# Patient Record
Sex: Female | Born: 1960 | Race: White | Hispanic: No | Marital: Married | Smoking: Never smoker
Health system: Southern US, Community
[De-identification: ages and names within clinical notes are randomized; demographics above are authoritative.]

## PROBLEM LIST (undated history)

## (undated) DIAGNOSIS — E785 Hyperlipidemia, unspecified: Secondary | ICD-10-CM

## (undated) DIAGNOSIS — F32A Depression, unspecified: Secondary | ICD-10-CM

## (undated) DIAGNOSIS — M720 Palmar fascial fibromatosis [Dupuytren]: Secondary | ICD-10-CM

## (undated) DIAGNOSIS — M7500 Adhesive capsulitis of unspecified shoulder: Secondary | ICD-10-CM

## (undated) DIAGNOSIS — R87619 Unspecified abnormal cytological findings in specimens from cervix uteri: Secondary | ICD-10-CM

## (undated) DIAGNOSIS — E039 Hypothyroidism, unspecified: Secondary | ICD-10-CM

## (undated) DIAGNOSIS — E109 Type 1 diabetes mellitus without complications: Secondary | ICD-10-CM

## (undated) DIAGNOSIS — C4499 Other specified malignant neoplasm of skin, unspecified: Secondary | ICD-10-CM

## (undated) DIAGNOSIS — B029 Zoster without complications: Secondary | ICD-10-CM

## (undated) DIAGNOSIS — F329 Major depressive disorder, single episode, unspecified: Secondary | ICD-10-CM

## (undated) HISTORY — DX: Palmar fascial fibromatosis (dupuytren): M72.0

## (undated) HISTORY — PX: MOLE REMOVAL: SHX2046

## (undated) HISTORY — PX: COLONOSCOPY: SHX174

## (undated) HISTORY — DX: Zoster without complications: B02.9

## (undated) HISTORY — DX: Hypothyroidism, unspecified: E03.9

## (undated) HISTORY — DX: Other specified malignant neoplasm of skin, unspecified: C44.99

## (undated) HISTORY — DX: Depression, unspecified: F32.A

## (undated) HISTORY — DX: Major depressive disorder, single episode, unspecified: F32.9

## (undated) HISTORY — DX: Unspecified abnormal cytological findings in specimens from cervix uteri: R87.619

## (undated) HISTORY — DX: Type 1 diabetes mellitus without complications: E10.9

## (undated) HISTORY — DX: Adhesive capsulitis of unspecified shoulder: M75.00

## (undated) HISTORY — DX: Hyperlipidemia, unspecified: E78.5

---
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
## 1988-10-11 HISTORY — PX: KNEE ARTHROSCOPY: SHX127

## 1988-12-11 HISTORY — PX: OTHER SURGICAL HISTORY: SHX169

## 1994-07-13 HISTORY — PX: OTHER SURGICAL HISTORY: SHX169

## 1997-08-13 ENCOUNTER — Ambulatory Visit (HOSPITAL_COMMUNITY): Admission: RE | Admit: 1997-08-13 | Discharge: 1997-08-13 | Payer: Self-pay | Admitting: Obstetrics and Gynecology

## 1997-09-03 ENCOUNTER — Ambulatory Visit (HOSPITAL_COMMUNITY): Admission: RE | Admit: 1997-09-03 | Discharge: 1997-09-03 | Payer: Self-pay | Admitting: Obstetrics and Gynecology

## 1997-12-28 ENCOUNTER — Inpatient Hospital Stay (HOSPITAL_COMMUNITY): Admission: AD | Admit: 1997-12-28 | Discharge: 1997-12-31 | Payer: Self-pay | Admitting: Obstetrics and Gynecology

## 1998-07-08 ENCOUNTER — Other Ambulatory Visit: Admission: RE | Admit: 1998-07-08 | Discharge: 1998-07-08 | Payer: Self-pay | Admitting: *Deleted

## 1999-07-21 ENCOUNTER — Other Ambulatory Visit: Admission: RE | Admit: 1999-07-21 | Discharge: 1999-07-21 | Payer: Self-pay | Admitting: *Deleted

## 2000-05-11 ENCOUNTER — Ambulatory Visit (HOSPITAL_COMMUNITY): Admission: RE | Admit: 2000-05-11 | Discharge: 2000-05-11 | Payer: Self-pay | Admitting: *Deleted

## 2000-05-11 ENCOUNTER — Encounter: Payer: Self-pay | Admitting: *Deleted

## 2000-07-14 ENCOUNTER — Other Ambulatory Visit: Admission: RE | Admit: 2000-07-14 | Discharge: 2000-07-14 | Payer: Self-pay | Admitting: *Deleted

## 2001-06-29 ENCOUNTER — Encounter: Payer: Self-pay | Admitting: *Deleted

## 2001-06-29 ENCOUNTER — Ambulatory Visit (HOSPITAL_COMMUNITY): Admission: RE | Admit: 2001-06-29 | Discharge: 2001-06-29 | Payer: Self-pay | Admitting: *Deleted

## 2001-08-18 ENCOUNTER — Other Ambulatory Visit: Admission: RE | Admit: 2001-08-18 | Discharge: 2001-08-18 | Payer: Self-pay | Admitting: *Deleted

## 2002-06-30 ENCOUNTER — Ambulatory Visit (HOSPITAL_COMMUNITY): Admission: RE | Admit: 2002-06-30 | Discharge: 2002-06-30 | Payer: Self-pay | Admitting: *Deleted

## 2002-06-30 ENCOUNTER — Encounter: Payer: Self-pay | Admitting: *Deleted

## 2002-08-23 ENCOUNTER — Other Ambulatory Visit: Admission: RE | Admit: 2002-08-23 | Discharge: 2002-08-23 | Payer: Self-pay | Admitting: *Deleted

## 2003-07-09 ENCOUNTER — Ambulatory Visit (HOSPITAL_COMMUNITY): Admission: RE | Admit: 2003-07-09 | Discharge: 2003-07-09 | Payer: Self-pay | Admitting: *Deleted

## 2003-07-14 DIAGNOSIS — B029 Zoster without complications: Secondary | ICD-10-CM

## 2003-07-14 HISTORY — DX: Zoster without complications: B02.9

## 2003-08-29 ENCOUNTER — Other Ambulatory Visit: Admission: RE | Admit: 2003-08-29 | Discharge: 2003-08-29 | Payer: Self-pay | Admitting: *Deleted

## 2004-08-06 ENCOUNTER — Ambulatory Visit (HOSPITAL_COMMUNITY): Admission: RE | Admit: 2004-08-06 | Discharge: 2004-08-06 | Payer: Self-pay | Admitting: *Deleted

## 2004-09-01 ENCOUNTER — Other Ambulatory Visit: Admission: RE | Admit: 2004-09-01 | Discharge: 2004-09-01 | Payer: Self-pay | Admitting: *Deleted

## 2005-09-29 ENCOUNTER — Ambulatory Visit (HOSPITAL_COMMUNITY): Admission: RE | Admit: 2005-09-29 | Discharge: 2005-09-29 | Payer: Self-pay | Admitting: Obstetrics and Gynecology

## 2006-05-28 ENCOUNTER — Other Ambulatory Visit: Admission: RE | Admit: 2006-05-28 | Discharge: 2006-05-28 | Payer: Self-pay | Admitting: Obstetrics & Gynecology

## 2006-10-25 ENCOUNTER — Ambulatory Visit (HOSPITAL_COMMUNITY): Admission: RE | Admit: 2006-10-25 | Discharge: 2006-10-25 | Payer: Self-pay | Admitting: Obstetrics & Gynecology

## 2007-06-24 ENCOUNTER — Other Ambulatory Visit: Admission: RE | Admit: 2007-06-24 | Discharge: 2007-06-24 | Payer: Self-pay | Admitting: Obstetrics & Gynecology

## 2007-12-07 ENCOUNTER — Ambulatory Visit (HOSPITAL_COMMUNITY): Admission: RE | Admit: 2007-12-07 | Discharge: 2007-12-07 | Payer: Self-pay | Admitting: Obstetrics & Gynecology

## 2008-07-19 ENCOUNTER — Other Ambulatory Visit: Admission: RE | Admit: 2008-07-19 | Discharge: 2008-07-19 | Payer: Self-pay | Admitting: Obstetrics & Gynecology

## 2009-01-23 ENCOUNTER — Ambulatory Visit (HOSPITAL_COMMUNITY): Admission: RE | Admit: 2009-01-23 | Discharge: 2009-01-23 | Payer: Self-pay | Admitting: Obstetrics & Gynecology

## 2009-01-30 ENCOUNTER — Encounter: Admission: RE | Admit: 2009-01-30 | Discharge: 2009-01-30 | Payer: Self-pay | Admitting: Obstetrics & Gynecology

## 2009-07-13 HISTORY — PX: GLAUCOMA REPAIR: SHX214

## 2010-03-07 ENCOUNTER — Ambulatory Visit (HOSPITAL_COMMUNITY): Admission: RE | Admit: 2010-03-07 | Discharge: 2010-03-07 | Payer: Self-pay | Admitting: Obstetrics & Gynecology

## 2010-08-04 ENCOUNTER — Encounter: Payer: Self-pay | Admitting: Obstetrics & Gynecology

## 2010-12-03 ENCOUNTER — Ambulatory Visit: Payer: Self-pay | Admitting: Sports Medicine

## 2010-12-24 ENCOUNTER — Other Ambulatory Visit: Payer: Self-pay | Admitting: Chiropractic Medicine

## 2010-12-24 DIAGNOSIS — M7540 Impingement syndrome of unspecified shoulder: Secondary | ICD-10-CM

## 2010-12-29 ENCOUNTER — Other Ambulatory Visit: Payer: Self-pay

## 2011-05-14 HISTORY — PX: SHOULDER ARTHROSCOPY W/ CAPSULAR REPAIR: SHX2398

## 2011-06-30 ENCOUNTER — Other Ambulatory Visit: Payer: Self-pay | Admitting: Obstetrics & Gynecology

## 2011-06-30 DIAGNOSIS — Z1231 Encounter for screening mammogram for malignant neoplasm of breast: Secondary | ICD-10-CM

## 2011-08-05 ENCOUNTER — Ambulatory Visit (HOSPITAL_COMMUNITY)
Admission: RE | Admit: 2011-08-05 | Discharge: 2011-08-05 | Disposition: A | Discharge status: Home / Self Care | Payer: 59 | Source: Ambulatory Visit | Attending: Obstetrics & Gynecology | Admitting: Obstetrics & Gynecology

## 2011-08-05 DIAGNOSIS — Z1231 Encounter for screening mammogram for malignant neoplasm of breast: Secondary | ICD-10-CM | POA: Insufficient documentation

## 2011-08-11 ENCOUNTER — Other Ambulatory Visit: Payer: Self-pay | Admitting: Obstetrics & Gynecology

## 2011-08-11 DIAGNOSIS — R928 Other abnormal and inconclusive findings on diagnostic imaging of breast: Secondary | ICD-10-CM

## 2011-08-18 ENCOUNTER — Ambulatory Visit
Admission: RE | Admit: 2011-08-18 | Discharge: 2011-08-18 | Disposition: A | Discharge status: Home / Self Care | Payer: 59 | Source: Ambulatory Visit | Attending: Obstetrics & Gynecology | Admitting: Obstetrics & Gynecology

## 2011-08-18 DIAGNOSIS — R928 Other abnormal and inconclusive findings on diagnostic imaging of breast: Secondary | ICD-10-CM

## 2011-10-29 ENCOUNTER — Encounter: Payer: Self-pay | Admitting: Internal Medicine

## 2011-12-09 ENCOUNTER — Ambulatory Visit (AMBULATORY_SURGERY_CENTER): Payer: 59 | Admitting: *Deleted

## 2011-12-09 ENCOUNTER — Encounter: Payer: Self-pay | Admitting: Internal Medicine

## 2011-12-09 VITALS — Ht 63.0 in | Wt 150.8 lb

## 2011-12-09 DIAGNOSIS — Z1211 Encounter for screening for malignant neoplasm of colon: Secondary | ICD-10-CM

## 2011-12-09 MED ORDER — MOVIPREP 100 G PO SOLR: ORAL | Status: DC

## 2011-12-23 ENCOUNTER — Encounter: Payer: Self-pay | Admitting: Internal Medicine

## 2011-12-23 ENCOUNTER — Ambulatory Visit (AMBULATORY_SURGERY_CENTER): Payer: 59 | Admitting: Internal Medicine

## 2011-12-23 VITALS — BP 163/67 | HR 58 | Temp 98.5°F | Resp 20 | Ht 63.0 in | Wt 150.0 lb

## 2011-12-23 DIAGNOSIS — Z1211 Encounter for screening for malignant neoplasm of colon: Secondary | ICD-10-CM

## 2011-12-23 MED ORDER — SODIUM CHLORIDE 0.9 % IV SOLN: 500.0000 mL | INTRAVENOUS | Status: DC

## 2011-12-23 NOTE — Op Note (Signed)
[facility] [address] [place], [place]  [address]  COLONOSCOPY PROCEDURE REPORT  PATIENT:  Megan Cain, Megan Cain  MR#:  [id number] BIRTHDATE:  1961-01-01, 51 yrs. old  GENDER:  female ENDOSCOPIST:  Hedwig Morton. Juanda Chance, MD REF. BY:  Leda Quail, M.D. PROCEDURE DATE:  12/23/2011 PROCEDURE:  Colonoscopy 29528 ASA CLASS:  Class I INDICATIONS:  colorectal cancer screening, average risk MEDICATIONS:   MAC sedation, administered by CRNA, propofol (Diprivan) 400 mg  DESCRIPTION OF PROCEDURE:   After the risks and benefits and of the procedure were explained, informed consent was obtained. Digital rectal exam was performed and revealed no rectal masses. The LB [id number]-[id number] endoscope was introduced through the anus and advanced to the cecum, which was identified by both the appendix and ileocecal valve.  The quality of the prep was good, using MoviPrep.  The instrument was then slowly withdrawn as the colon was fully examined. <<PROCEDUREIMAGES>>  FINDINGS:  No polyps or cancers were seen (see image1, image2, image3, image4, and image5).   Retroflexed views in the rectum revealed no abnormalities.    The scope was then withdrawn from the patient and the procedure completed.  COMPLICATIONS:  None ENDOSCOPIC IMPRESSION: 1) No polyps or cancers 2) Normal colonoscopy RECOMMENDATIONS: 1) High fiber diet.  REPEAT EXAM:  In 10 year(s) for.  ______________________________ Hedwig Morton. Juanda Chance, MD  CC:  n. eSIGNED:   Hedwig Morton. Micajah Dennin at 12/23/2011 11:31 AM  Hughes[place]james, [place], [phone]

## 2011-12-23 NOTE — Progress Notes (Signed)
Patient did not experience any of the following events: a burn prior to discharge; a fall within the facility; wrong site/side/patient/procedure/implant event; or a hospital transfer or hospital admission upon discharge from the facility. (G8907) Patient did not have preoperative order for IV antibiotic SSI prophylaxis. (G8918)  

## 2011-12-23 NOTE — Patient Instructions (Addendum)
Discharge instructions given with verbal understanding. Normal exam. Resume previous medications. YOU HAD AN ENDOSCOPIC PROCEDURE TODAY AT THE [facility]: Refer to the procedure report that was given to you for any specific questions about what was found during the examination.  If the procedure report does not answer your questions, please call your gastroenterologist to clarify.  If you requested that your care partner not be given the details of your procedure findings, then the procedure report has been included in a sealed envelope for you to review at your convenience later.  YOU SHOULD EXPECT: Some feelings of bloating in the abdomen. Passage of more gas than usual.  Walking can help get rid of the air that was put into your GI tract during the procedure and reduce the bloating. If you had a lower endoscopy (such as a colonoscopy or flexible sigmoidoscopy) you may notice spotting of blood in your stool or on the toilet paper. If you underwent a bowel prep for your procedure, then you may not have a normal bowel movement for a few days.  DIET: Your first meal following the procedure should be a light meal and then it is ok to progress to your normal diet.  A half-sandwich or bowl of soup is an example of a good first meal.  Heavy or fried foods are harder to digest and may make you feel nauseous or bloated.  Likewise meals heavy in dairy and vegetables can cause extra gas to form and this can also increase the bloating.  Drink plenty of fluids but you should avoid alcoholic beverages for 24 hours.  ACTIVITY: Your care partner should take you home directly after the procedure.  You should plan to take it easy, moving slowly for the rest of the day.  You can resume normal activity the day after the procedure however you should NOT DRIVE or use heavy machinery for 24 hours (because of the sedation medicines used during the test).    SYMPTOMS TO REPORT IMMEDIATELY: A gastroenterologist  can be reached at any hour.  During normal business hours, 8:30 AM to 5:00 PM Monday through Friday, call [phone].  After hours and on weekends, please call the GI answering service at [phone] who will take a message and have the physician on call contact you.   Following lower endoscopy (colonoscopy or flexible sigmoidoscopy):  Excessive amounts of blood in the stool  Significant tenderness or worsening of abdominal pains  Swelling of the abdomen that is new, acute  Fever of 100F or higher  FOLLOW UP: If any biopsies were taken you will be contacted by phone or by letter within the next 1-3 weeks.  Call your gastroenterologist if you have not heard about the biopsies in 3 weeks.  Our staff will call the home number listed on your records the next business day following your procedure to check on you and address any questions or concerns that you may have at that time regarding the information given to you following your procedure. This is a courtesy call and so if there is no answer at the home number and we have not heard from you through the emergency physician on call, we will assume that you have returned to your regular daily activities without incident.  SIGNATURES/CONFIDENTIALITY: You and/or your care partner have signed paperwork which will be entered into your electronic medical record.  These signatures attest to the fact that that the information above on your After Visit Summary has been reviewed   and is understood.  Full responsibility of the confidentiality of this discharge information lies with you and/or your care-partner. 

## 2011-12-24 ENCOUNTER — Telehealth: Payer: Self-pay | Admitting: *Deleted

## 2011-12-24 NOTE — Telephone Encounter (Signed)
  Follow up Call-  Call back number 12/23/2011  Post procedure Call Back phone  # [phone]  Permission to leave phone message Yes      No answer at number given.  Message left on voicemail.

## 2012-11-07 ENCOUNTER — Encounter: Payer: Self-pay | Admitting: Certified Nurse Midwife

## 2012-11-08 ENCOUNTER — Encounter: Payer: Self-pay | Admitting: Obstetrics & Gynecology

## 2012-11-08 ENCOUNTER — Ambulatory Visit (INDEPENDENT_AMBULATORY_CARE_PROVIDER_SITE_OTHER): Payer: 59 | Admitting: Obstetrics & Gynecology

## 2012-11-08 VITALS — BP 120/76 | Ht 63.25 in | Wt 140.0 lb

## 2012-11-08 DIAGNOSIS — Z01419 Encounter for gynecological examination (general) (routine) without abnormal findings: Secondary | ICD-10-CM

## 2012-11-08 DIAGNOSIS — Z0000 Encounter for general adult medical examination without abnormal findings: Secondary | ICD-10-CM

## 2012-11-08 LAB — POCT URINALYSIS DIPSTICK
Bilirubin, UA: NEGATIVE
Blood, UA: NEGATIVE
Glucose, UA: NEGATIVE
Ketones, UA: NEGATIVE
Leukocytes, UA: NEGATIVE
Nitrite, UA: NEGATIVE
Protein, UA: NEGATIVE
Urobilinogen, UA: NEGATIVE
pH, UA: 5

## 2012-11-08 NOTE — Patient Instructions (Signed)

## 2012-11-08 NOTE — Progress Notes (Signed)
52 y.o. G3P0 MarriedCaucasianF here for annual exam.  Has lost weight and is exercising more.  Stopped her statin.  Has follow up labs in May to see if there are any changes.  Has had two in last six months.  Not really having hot flashes or night sweats.  Off Prozac.  Doing college visiting with child who is a Holiday representative in high school.   Patient's last menstrual period was 10/29/2012.          Sexually active: yes  The current method of family planning is condoms .    Exercising: yes  weight training and cardio. Smoker:  no  Health Maintenance: Pap:  10/20/11 WNL/negative HR HPV MMG:  08/05/11 mmg 08/18/11 additional views-normal Colonoscopy:  2013 repeat 10 years, Dr. Juanda Chance BMD:   none TDaP:  11/07 Labs: 11/13, cholesterol was mildly increased   reports that she has never smoked. She has never used smokeless tobacco. She reports that  drinks alcohol. She reports that she does not use illicit drugs.  Past Medical History  Diagnosis Date  . Glaucoma(365) 2012 ou    laser done  . Depression   . Hyperlipidemia   . Hypothyroid   . Shingles 2005    Past Surgical History  Procedure Laterality Date  . Knee arthroscopy  1987    rt. knee  . Shoulder arthroscopy w/ capsular repair  2012    lt. shoulder  . Cesarian section  1996  . Thumb surgery Left 1975    Current Outpatient Prescriptions  Medication Sig Dispense Refill  . aspirin EC 81 MG tablet Take 81 mg by mouth daily.      . B Complex Vitamins (B COMPLEX PO) Take by mouth.      . Cetirizine HCl (ZYRTEC PO) Take by mouth daily.      . cholecalciferol (VITAMIN D) 1000 UNITS tablet Take 1,000 Units by mouth daily.      . Coenzyme Q10 (CO Q 10 PO) Take by mouth.      . fish oil-omega-3 fatty acids 1000 MG capsule Take 2 g by mouth daily.      Marland Kitchen levothyroxine (SYNTHROID, LEVOTHROID) 75 MCG tablet Take 1 tablet by mouth Daily.      . metFORMIN (GLUCOPHAGE) 500 MG tablet Take 500 mg by mouth daily.      . Multiple Vitamins-Minerals  (MULTIVITAMIN WITH MINERALS) tablet Take 1 tablet by mouth daily.      . Probiotic Product (PROBIOTIC DAILY PO) Take by mouth daily.      Marland Kitchen QUERCETIN PO Take by mouth 2 (two) times daily.      . Turmeric 450 MG CAPS Take by mouth 2 (two) times daily.       No current facility-administered medications for this visit.    Family History  Problem Relation Age of Onset  . Heart disease Father   . Diabetes Son     ROS:  Pertinent items are noted in HPI.  Otherwise, a comprehensive ROS was negative.  Exam:   BP 120/76  Ht 5' 3.25" (1.607 m)  Wt 140 lb (63.504 kg)  BMI 24.59 kg/m2  LMP 10/29/2012  Weight change: -7 pounds   Height: 5' 3.25" (160.7 cm)  Ht Readings from Last 3 Encounters:  11/08/12 5' 3.25" (1.607 m)  12/23/11 5\' 3"  (1.6 m)  12/09/11 5\' 3"  (1.6 m)    General appearance: alert, cooperative and appears stated age Head: Normocephalic, without obvious abnormality, atraumatic Neck: no adenopathy, supple, symmetrical, trachea  midline and thyroid normal to inspection and palpation Lungs: clear to auscultation bilaterally Breasts: normal appearance, no masses or tenderness Heart: regular rate and rhythm Abdomen: soft, non-tender; bowel sounds normal; no masses,  no organomegaly Extremities: extremities normal, atraumatic, no cyanosis or edema Skin: Skin color, texture, turgor normal. No rashes or lesions Lymph nodes: Cervical, supraclavicular, and axillary nodes normal. No abnormal inguinal nodes palpated Neurologic: Grossly normal   Pelvic: External genitalia:  no lesions              Urethra:  normal appearing urethra with no masses, tenderness or lesions              Bartholins and Skenes: normal                 Vagina: normal appearing vagina with normal color and discharge, no lesions              Cervix: no lesions              Pap taken: no Bimanual Exam:  Uterus:  normal size, contour, position, consistency, mobility, non-tender              Adnexa: no mass,  fullness, tenderness               Rectovaginal: Confirms               Anus:  normal sphincter tone, no lesions  A:  Well Woman with normal exam Perimenopausal with DUB H/O CIS of Cx, 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  Pt will schedule.  She and I discussed 3D due to densities in her breasts pap smear not done today.  Neg with Neg HR HPV 4/13. Labs every six months with endocrinologist, Dr. Talmage Nap return annually or prn  An After Visit Summary was printed and given to the patient.

## 2012-11-09 LAB — HEMOGLOBIN, FINGERSTICK: Hemoglobin, fingerstick: 12.3 g/dL (ref 12.0–16.0)

## 2012-11-16 ENCOUNTER — Other Ambulatory Visit: Payer: Self-pay | Admitting: Obstetrics & Gynecology

## 2012-11-16 ENCOUNTER — Other Ambulatory Visit: Payer: Self-pay

## 2012-11-16 DIAGNOSIS — Z1231 Encounter for screening mammogram for malignant neoplasm of breast: Secondary | ICD-10-CM

## 2012-12-15 ENCOUNTER — Ambulatory Visit
Admission: RE | Admit: 2012-12-15 | Discharge: 2012-12-15 | Disposition: A | Discharge status: Home / Self Care | Payer: 59 | Source: Ambulatory Visit

## 2012-12-15 DIAGNOSIS — Z1231 Encounter for screening mammogram for malignant neoplasm of breast: Secondary | ICD-10-CM

## 2013-11-13 ENCOUNTER — Ambulatory Visit (INDEPENDENT_AMBULATORY_CARE_PROVIDER_SITE_OTHER): Payer: 59 | Admitting: Obstetrics & Gynecology

## 2013-11-13 ENCOUNTER — Encounter: Payer: Self-pay | Admitting: Obstetrics & Gynecology

## 2013-11-13 VITALS — BP 120/80 | HR 64 | Resp 12 | Ht 63.25 in | Wt 144.4 lb

## 2013-11-13 DIAGNOSIS — Z0000 Encounter for general adult medical examination without abnormal findings: Secondary | ICD-10-CM

## 2013-11-13 DIAGNOSIS — Z01419 Encounter for gynecological examination (general) (routine) without abnormal findings: Secondary | ICD-10-CM

## 2013-11-13 DIAGNOSIS — Z124 Encounter for screening for malignant neoplasm of cervix: Secondary | ICD-10-CM

## 2013-11-13 LAB — POCT URINALYSIS DIPSTICK
Bilirubin, UA: NEGATIVE
Blood, UA: NEGATIVE
Glucose, UA: NEGATIVE
Ketones, UA: NEGATIVE
Leukocytes, UA: NEGATIVE
Nitrite, UA: NEGATIVE
Protein, UA: NEGATIVE
Urobilinogen, UA: NEGATIVE
pH, UA: 5

## 2013-11-13 NOTE — Progress Notes (Signed)
53 y.o. G3P2 MarriedCaucasianF here for annual exam.  Just got back from trip to [place] that she went on with her son (was latin classes at [employer]).      Sees Dr. Chalmers Cater about every six months.  Last blood work was in November.  HBA1C was 6.0.    Had not had a cycle in 10 1/2 months.  Had a cycle in April.  Flow was not heavy.  More like a regular cycle to her.  Did have some PMS symptoms.    Patient's last menstrual period was 11/03/2013.          Sexually active: yes  The current method of family planning is condoms.    Exercising: yes  cardio, walking, running, and strength training Smoker:  no  Health Maintenance: Pap:  10/20/11 WNL/negative HR HPV History of abnormal Pap:  Yes h/o CIS of cervix 1990 MMG:  12/15/12-normal, 3D Colonoscopy:  2013-repeat in 10 years BMD:   none TDaP:  11/07 Screening Labs:with Dr. Chalmers Cater, Hb today: 12.6, Urine today: negative   reports that she has never smoked. She has never used smokeless tobacco. She reports that she drinks about .5 ounces of alcohol per week. She reports that she does not use illicit drugs.  Past Medical History  Diagnosis Date  . Glaucoma         . Depression   . Hyperlipidemia   . Hypothyroid   . Shingles 2005    Past Surgical History  Procedure Laterality Date  . Knee arthroscopy Right 4/90       . Shoulder arthroscopy w/ capsular repair  11/12    lt. shoulder  . Cesarian section  1996  . Thumb surgery Left 1975  . Laser conization  6/90    CIS of cervix  . Glaucoma repair Bilateral 2011    Dr Ellie Lunch    Current Outpatient Prescriptions  Medication Sig Dispense Refill  . ARMOUR THYROID 60 MG tablet       . aspirin EC 81 MG tablet Take 81 mg by mouth daily.      . B Complex Vitamins (B COMPLEX PO) Take by mouth.      . Cetirizine HCl (ZYRTEC PO) Take by mouth daily.      . cholecalciferol (VITAMIN D) 1000 UNITS tablet Take 1,000 Units by mouth daily.      . Coenzyme Q10 (CO Q 10 PO) Take by mouth.       . fish oil-omega-3 fatty acids 1000 MG capsule Take 2 g by mouth daily.      . metFORMIN (GLUCOPHAGE) 500 MG tablet Take 500 mg by mouth daily.      . Multiple Vitamins-Minerals (MULTIVITAMIN WITH MINERALS) tablet Take 1 tablet by mouth daily.      . Probiotic Product (PROBIOTIC DAILY PO) Take by mouth daily.      Marland Kitchen QUERCETIN PO Take by mouth 2 (two) times daily.      . meloxicam (MOBIC) 15 MG tablet as needed.      . Turmeric 450 MG CAPS Take by mouth 2 (two) times daily.       No current facility-administered medications for this visit.    Family History  Problem Relation Age of Onset  . Heart disease Father   . Diabetes Son     type 1 in both sons    ROS:  Pertinent items are noted in HPI.  Otherwise, a comprehensive ROS was negative.  Exam:   BP 120/80  Pulse 64  Resp 12  Ht 5' 3.25" (1.607 m)  Wt 144 lb 6.4 oz (65.499 kg)  BMI 25.36 kg/m2  LMP 11/03/2013  Weight change: +4# Height:   Height: 5' 3.25" (160.7 cm)  Ht Readings from Last 3 Encounters:  11/13/13 5' 3.25" (1.607 m)  11/08/12 5' 3.25" (1.607 m)  12/23/11 5\' 3"  (1.6 m)    General appearance: alert, cooperative and appears stated age Head: Normocephalic, without obvious abnormality, atraumatic Neck: no adenopathy, supple, symmetrical, trachea midline and thyroid normal to inspection and palpation Lungs: clear to auscultation bilaterally Breasts: normal appearance, no masses or tenderness Heart: regular rate and rhythm Abdomen: soft, non-tender; bowel sounds normal; no masses,  no organomegaly Extremities: extremities normal, atraumatic, no cyanosis or edema Skin: Skin color, texture, turgor normal. No rashes or lesions Lymph nodes: Cervical, supraclavicular, and axillary nodes normal. No abnormal inguinal nodes palpated Neurologic: Grossly normal   Pelvic: External genitalia:  no lesions              Urethra:  normal appearing urethra with no masses, tenderness or lesions              Bartholins and  Skenes: normal                 Vagina: normal appearing vagina with normal color and discharge, no lesions              Cervix: no lesions              Pap taken: yes Bimanual Exam:  Uterus:  normal size, contour, position, consistency, mobility, non-tender              Adnexa: normal adnexa and no mass, fullness, tenderness               Rectovaginal: Confirms               Anus:  normal sphincter tone, no lesions  A:  Well Woman with normal exam Perimenopausal.  Precautions given for calling with any bleeding but especially if it is heavy.  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  D/W pt 3D MMG due to dense breast tissue pap smear only today.  Neg HR HPV 2013 Labs every six months with Dr. Chalmers Cater return annually or prn  An After Visit Summary was printed and given to the patient.

## 2013-11-13 NOTE — Patient Instructions (Signed)

## 2013-11-14 LAB — HEMOGLOBIN, FINGERSTICK: Hemoglobin, fingerstick: 12.6 g/dL (ref 12.0–16.0)

## 2013-11-16 LAB — IPS PAP SMEAR ONLY

## 2014-01-04 ENCOUNTER — Other Ambulatory Visit: Payer: Self-pay

## 2014-01-04 DIAGNOSIS — Z1231 Encounter for screening mammogram for malignant neoplasm of breast: Secondary | ICD-10-CM

## 2014-01-16 ENCOUNTER — Ambulatory Visit
Admission: RE | Admit: 2014-01-16 | Discharge: 2014-01-16 | Disposition: A | Discharge status: Home / Self Care | Payer: 59 | Source: Ambulatory Visit

## 2014-01-16 DIAGNOSIS — Z1231 Encounter for screening mammogram for malignant neoplasm of breast: Secondary | ICD-10-CM

## 2014-05-11 ENCOUNTER — Telehealth: Payer: Self-pay | Admitting: Obstetrics & Gynecology

## 2014-05-11 NOTE — Telephone Encounter (Signed)
Pt says she is having some issues.

## 2014-05-11 NOTE — Telephone Encounter (Signed)
Spoke with patient. Patient states "I feel like my hormone levels are off. I have a low thyroid which I am getting checked soon but I have been feeling different. I think it may be related to menopause." Patient has been experiencing fatigue, night sweats, restless sleep, and depression due to fatigue. Denies feelings of wanting to harm self or others. Patient would like to be seen to discuss this and have levels checked. Appointment scheduled for 11/3 at 10:15 am with Milford Cage, FNP. Patient is agreeable to date and time.  Routing to [employer], FNP  Cc: Dr.Miller  Routing to provider for final review. Patient agreeable to disposition. Will close encounter

## 2014-05-14 ENCOUNTER — Encounter: Payer: Self-pay | Admitting: Obstetrics & Gynecology

## 2014-05-15 ENCOUNTER — Ambulatory Visit (INDEPENDENT_AMBULATORY_CARE_PROVIDER_SITE_OTHER): Payer: 59 | Admitting: Nurse Practitioner

## 2014-05-15 ENCOUNTER — Encounter: Payer: Self-pay | Admitting: Nurse Practitioner

## 2014-05-15 VITALS — BP 118/84 | HR 64 | Resp 16 | Ht 63.25 in | Wt 142.0 lb

## 2014-05-15 DIAGNOSIS — N912 Amenorrhea, unspecified: Secondary | ICD-10-CM

## 2014-05-15 MED ORDER — MEDROXYPROGESTERONE ACETATE 10 MG PO TABS: 10.0000 mg | ORAL_TABLET | Freq: Every day | ORAL | Status: DC

## 2014-05-15 NOTE — Patient Instructions (Signed)

## 2014-05-15 NOTE — Progress Notes (Signed)
Patient ID: Megan Cain, female   DOB: 08-01-1960, 53 y.o.   MRN: [id number] S: This 53 yo WMF presents for consult visit to discuss menopause and HRT.  She has a history of irregular menses for several years and had Provera challenge at least twice.  She is now having an increasing problems with fatigue, moods, depression, low libido, restless sleep, night sweats, and hot flashes.   Some vaginal dryness.   LMP: 02/27/2014 with normal flow.  Prior to this very heavy flow.  In the past has had amenorrhea up to 6-9 months.  She feels like menses may start but does not.Marland Kitchen  She would like to start on OTC help without hormones.  She feels well otherwise.  A:  Perimenopausal C/W irregular menses  Plan: Will get [facility] and follow  Provera challenge and will call back with +/- results   Consult time: 15 minutes

## 2014-05-16 LAB — FOLLICLE STIMULATING HORMONE: FSH: 73.8 m[IU]/mL

## 2014-05-21 ENCOUNTER — Encounter: Payer: Self-pay | Admitting: Nurse Practitioner

## 2014-05-24 NOTE — Progress Notes (Signed)
Encounter reviewed by Dr. Brook Silva.  

## 2014-09-24 ENCOUNTER — Telehealth: Payer: Self-pay | Admitting: Nurse Practitioner

## 2014-09-24 NOTE — Telephone Encounter (Signed)
Pt requsted to leave results of provera challenge: in December started and day after she finished she started her period - appointment with Dr Sabra Heck is not for several months but pt states she is ready to start horomone therapy she discussed with [place] Circle.

## 2014-09-24 NOTE — Telephone Encounter (Signed)
Pt left voicemail stating she is now available at her home number [phone] for a return call

## 2014-09-24 NOTE — Telephone Encounter (Signed)
Patient was last seen for discussion of HRT and menopause with Milford Cage, FNP on 05/15/2014. Last aex was 11/13/2013 with Dr.Miller. Next aex is scheduled for 11/26/14 with Dr.Miller. Patient had [place] drawn at 05/15/2014 visit which was 73.8. Took Provera 10mg  for 10 days in December and started a cycle one day after completion. Left message for patient to call [place] at [phone]. Need to know how long cycle was. Has she had any bleeding since? Currently taking any OTC medications for menopause symptoms? What are current symptoms?

## 2014-09-24 NOTE — Telephone Encounter (Signed)
Spoke with patient. Patient states that after completion of Provera she started a cycle on 1/7 that lasted for 5 days. Patient has not had any bleeding since. Patient has been using Estroven at night with no relief. "I am still having terrible night sweats and I can not sleep. My fasting blood sugar has also been horrible. I do not know if it is because I am not sleeping or what. I am going to speak with my endocrinologist about this. I previously talked with Patty about getting started on hormones. I want to try them out." Advised will send a message over to [place] as Milford Cage, FNP is out of the office today and return call with further recommendations. Patient is agreeable.

## 2014-09-26 MED ORDER — ESTRADIOL-NORETHINDRONE ACET 0.5-0.1 MG PO TABS: 1.0000 | ORAL_TABLET | Freq: Every day | ORAL | Status: DC

## 2014-09-26 NOTE — Telephone Encounter (Signed)
Spoke with patient. Advised of message as seen below from [place]. Patient is agreeable and verbalizes understanding. Rx for Activella 0.5/0.1mg  sent to pharmacy on file. Follow up appointment scheduled for 3/24 at 12:45pm with Milford Cage, [place]. Patient is agreeable to date and time.  Routing to provider for final review. Patient agreeable to disposition. Will close encounter

## 2014-09-26 NOTE — Telephone Encounter (Signed)
Can start Activella 0.5/0.1mg  dosage. This is the 1/2 strength dosage.  She does need follow up in the next week.

## 2014-09-26 NOTE — Telephone Encounter (Signed)
Left message to call Kaitlyn at [phone]. 

## 2014-10-04 ENCOUNTER — Ambulatory Visit (INDEPENDENT_AMBULATORY_CARE_PROVIDER_SITE_OTHER): Payer: 59 | Admitting: Nurse Practitioner

## 2014-10-04 ENCOUNTER — Encounter: Payer: Self-pay | Admitting: Nurse Practitioner

## 2014-10-04 VITALS — BP 120/84 | HR 60 | Ht 63.25 in | Wt 138.0 lb

## 2014-10-04 DIAGNOSIS — N951 Menopausal and female climacteric states: Secondary | ICD-10-CM

## 2014-10-04 MED ORDER — ESTRADIOL-NORETHINDRONE ACET 0.5-0.1 MG PO TABS: 1.0000 | ORAL_TABLET | Freq: Every day | ORAL | Status: DC

## 2014-10-04 NOTE — Patient Instructions (Signed)

## 2014-10-04 NOTE — Progress Notes (Signed)
Patient ID: Megan Cain, female   DOB: 07/03/1961, 54 y.o.   MRN: [id number] S:  This 54 yo WM female returns for consult visit for 1 week  recheck on HRT.   She had a previous [place] of 73.8 in November.  She has been considering HRT but has been leaning toward Bio identical HRT.  She took Provera in January and had a normal menses of 5 days with moderate flow.  She has now had really bad night sweats and very uncomfortable with decrease in libido, and poor sleep.  Her blood sugar has also been up and down. She saw endocrinologist and is now back on Metformin. She comes now for a consult after starting on HRT  A week ago.  States she is feeling quite good with less night sweats.     O: Mood and affect is normal   Assessment: Postmenopausal changes with vaso symptoms  Plan:    In depth discussion about WHI study with benefits and risk including DVT, CVA, and cancer.  She feels that she needs HRT and request that she continue.  She is seeking other Bio identical HRT avenues.  Will continue with HRT Activella with new RX for 90 days until she returns for AEX with Dr. Sabra Heck in June.  Consult time:  15 minutes face to face

## 2014-10-07 NOTE — Progress Notes (Signed)
Encounter reviewed by Dr. Brook Silva.  

## 2014-10-30 ENCOUNTER — Other Ambulatory Visit: Payer: Self-pay | Admitting: Obstetrics & Gynecology

## 2014-10-30 MED ORDER — LOPREEZA 0.5-0.1 MG PO TABS: 1.0000 | ORAL_TABLET | Freq: Every day | ORAL | Status: DC

## 2014-10-30 NOTE — Telephone Encounter (Signed)
Medication refill request: Lopreeza  Last AEX:  11/13/13 SM Next AEX: 11/26/14 SM Last MMG (if hormonal medication request): 01/16/14 [id number]:neg Refill authorized: Activella sent 10/04/14 #90/0R to [facility]   Called pharmacy Jessi states system defaulted Activella to [place] and now patient wants to stay in [place] but insurance won't pay unless it is written as Lopreeza only.  Dr. Sabra Heck please advise.

## 2014-10-30 NOTE — Telephone Encounter (Signed)
Pt checking on refill for Lopreeza generic for Activella to [facility] on [address] at [phone].

## 2014-11-23 ENCOUNTER — Telehealth: Payer: Self-pay | Admitting: Nurse Practitioner

## 2014-11-23 NOTE — Telephone Encounter (Signed)
Megan Cain with [facility] calling regarding pts Lopreeza. Claiborne Billings states [facility] DAW [facility] to process rx. Pt does not want a generic option.   Pt is in store now. Pharmacist states they have tried to have this taken care of earlier this week. Megan Cain pt.

## 2014-11-23 NOTE — Telephone Encounter (Signed)
Spoke with [facility] at [facility]. Advised okay to dispense Lopreeza as brand only medication per patient's request. Megan Cain is agreeable and will fill at this time.  Routing to provider for final review. Patient agreeable to disposition. Patient aware provider will review message and nurse will return call with any additional instructions or change of disposition. Will close encounter.

## 2014-11-26 ENCOUNTER — Ambulatory Visit (INDEPENDENT_AMBULATORY_CARE_PROVIDER_SITE_OTHER): Payer: 59 | Admitting: Obstetrics & Gynecology

## 2014-11-26 ENCOUNTER — Encounter: Payer: Self-pay | Admitting: Obstetrics & Gynecology

## 2014-11-26 VITALS — BP 128/84 | HR 60 | Resp 16 | Ht 63.25 in | Wt 136.6 lb

## 2014-11-26 DIAGNOSIS — E038 Other specified hypothyroidism: Secondary | ICD-10-CM | POA: Diagnosis not present

## 2014-11-26 DIAGNOSIS — Z01419 Encounter for gynecological examination (general) (routine) without abnormal findings: Secondary | ICD-10-CM | POA: Diagnosis not present

## 2014-11-26 DIAGNOSIS — E119 Type 2 diabetes mellitus without complications: Secondary | ICD-10-CM

## 2014-11-26 DIAGNOSIS — Z0000 Encounter for general adult medical examination without abnormal findings: Secondary | ICD-10-CM | POA: Diagnosis not present

## 2014-11-26 DIAGNOSIS — E039 Hypothyroidism, unspecified: Secondary | ICD-10-CM | POA: Insufficient documentation

## 2014-11-26 LAB — POCT URINALYSIS DIPSTICK
Bilirubin, UA: NEGATIVE
Glucose, UA: NEGATIVE
Ketones, UA: NEGATIVE
Leukocytes, UA: NEGATIVE
Nitrite, UA: NEGATIVE
Protein, UA: NEGATIVE
Urobilinogen, UA: NEGATIVE
pH, UA: 5

## 2014-11-26 NOTE — Progress Notes (Signed)
54 y.o. G3P2 MarriedCaucasianF here for annual exam.  Started HRT this year after going into menopause this past year. Would like to consider "bioidentical"  HRT.  Was having some issues with sleep and noted her blood sugars were creeping back up so she restarted her Glucophage as well.  Back on this now for two months.  She has follow up for this next week.  Will see Dr. Chalmers Cater.  Will have blood work on Thursday.  Last one was six months ago and was 6.1.   No vaginal bleeding on the HRT.    Patient's last menstrual period was 07/19/2014.          Sexually active: Yes.    The current method of family planning is condoms most of the time.    Exercising: Yes.    cardio, spinning, walking, free weights, and pilates Smoker:  no  Health Maintenance: Pap:  11/13/13 WNL, neg H RHPV 2013 History of abnormal Pap:  yes MMG:  01/16/14-normal Colonoscopy:  2013-repeat in 10 years BMD:   none TDaP:  11/07 Screening Labs: Dr. Chalmers Cater later this week, Hb today: 12.6, Urine today: RBC-trace   reports that she has never smoked. She has never used smokeless tobacco. She reports that she drinks alcohol. She reports that she does not use illicit drugs.  Past Medical History  Diagnosis Date  . Glaucoma         . Depression   . Hyperlipidemia   . Hypothyroid   . Shingles 2005  . Abnormal Pap smear of cervix     CIS of cervix  . Frozen shoulder 2012, 2014    left and right shoulder    Past Surgical History  Procedure Laterality Date  . Knee arthroscopy Right 4/90       . Shoulder arthroscopy w/ capsular repair  11/12    lt. shoulder  . Cesarian section  1996  . Thumb surgery Left 1975  . Laser conization  6/90    CIS of cervix  . Glaucoma repair Bilateral 2011    Dr Ellie Lunch    Current Outpatient Prescriptions  Medication Sig Dispense Refill  . ARMOUR THYROID 60 MG tablet Take 60 mg by mouth daily before breakfast.     . aspirin EC 81 MG tablet Take 81 mg by mouth daily.    . B Complex Vitamins  (B COMPLEX PO) Take by mouth.    . Cetirizine HCl (ZYRTEC PO) Take by mouth as needed.     . cholecalciferol (VITAMIN D) 1000 UNITS tablet Take 1,000 Units by mouth daily.    . CHROMIUM PO Take by mouth.    Marland Kitchen CINNAMON PO Take by mouth.    . Coenzyme Q10 (CO Q 10 PO) Take by mouth.    . Cyanocobalamin (VITAMIN B-12 PO) Take by mouth.    . fish oil-omega-3 fatty acids 1000 MG capsule Take 2 g by mouth daily.    Marland Kitchen LOPREEZA 0.5-0.1 MG per tablet Take 1 tablet by mouth daily. 28 tablet 0  . MAGNESIUM PO Take by mouth.    . metFORMIN (GLUCOPHAGE-XR) 500 MG 24 hr tablet Take 1 tablet by mouth daily.    . Multiple Vitamins-Minerals (MULTIVITAMIN WITH MINERALS) tablet Take 1 tablet by mouth daily.    . Probiotic Product (PROBIOTIC DAILY PO) Take by mouth daily.    . Turmeric 450 MG CAPS Take by mouth 2 (two) times daily.     No current facility-administered medications for this visit.  Family History  Problem Relation Age of Onset  . Heart disease Father   . Hyperlipidemia Father   . Diabetes Son     type 1 in both sons  . Hypertension Mother     ROS:  Pertinent items are noted in HPI.  Otherwise, a comprehensive ROS was negative.  Exam:   BP 128/84 mmHg  Pulse 60  Resp 16  Ht 5' 3.25" (1.607 m)  Wt 136 lb 9.6 oz (61.961 kg)  BMI 23.99 kg/m2  LMP 07/19/2014  Weight change: -8#  Height: 5' 3.25" (160.7 cm)  Ht Readings from Last 3 Encounters:  11/26/14 5' 3.25" (1.607 m)  10/04/14 5' 3.25" (1.607 m)  05/15/14 5' 3.25" (1.607 m)    General appearance: alert, cooperative and appears stated age Head: Normocephalic, without obvious abnormality, atraumatic Neck: no adenopathy, supple, symmetrical, trachea midline and thyroid normal to inspection and palpation Lungs: clear to auscultation bilaterally Breasts: normal appearance, no masses or tenderness Heart: regular rate and rhythm Abdomen: soft, non-tender; bowel sounds normal; no masses,  no organomegaly Extremities: extremities  normal, atraumatic, no cyanosis or edema Skin: Skin color, texture, turgor normal. No rashes or lesions Lymph nodes: Cervical, supraclavicular, and axillary nodes normal. No abnormal inguinal nodes palpated Neurologic: Grossly normal   Pelvic: External genitalia:  no lesions              Urethra:  normal appearing urethra with no masses, tenderness or lesions              Bartholins and Skenes: normal                 Vagina: normal appearing vagina with normal color and discharge, no lesions              Cervix: no lesions              Pap taken: No. Bimanual Exam:  Uterus:  normal size, contour, position, consistency, mobility, non-tender              Adnexa: normal adnexa and no mass, fullness, tenderness               Rectovaginal: Confirms               Anus:  normal sphincter tone, no lesions  Chaperone was present for exam.  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 D/W pt 3D MMG due to dense breast tissue Pap 2015. Neg HR HPV 2013.  No pap today. Labs later this week with Dr. Chalmers Cater Biest 80/20 0.5mg /ml.  1/2 to 50ml  Topically.  Change to Prometrium 200mg  nightly days 1-15 each month return annually or prn

## 2014-11-28 LAB — HEMOGLOBIN, FINGERSTICK: Hemoglobin, fingerstick: 12.6 g/dL (ref 12.0–16.0)

## 2014-12-17 ENCOUNTER — Telehealth: Payer: Self-pay | Admitting: Obstetrics & Gynecology

## 2014-12-17 NOTE — Telephone Encounter (Signed)
Pt would like know if prescription has been called in since appointmentt with Dr. Sabra Heck on 11/26/14. Pt states she has been unable to get her prescription.  Pharmacy: [facility]

## 2014-12-17 NOTE — Telephone Encounter (Signed)
Spoke with patient. Patient was last seen on 11/26/2014. States Dr.Miller was going to check with [facility] regarding two new prescriptions. "She wanted to make sure they could fill them first." Patient is currently on Activella but states she is going to switch medications. Advised I will speak with Dr.Miller regarding medication change and return call with further recommendations. Patient is agreeable.

## 2014-12-27 ENCOUNTER — Other Ambulatory Visit: Payer: Self-pay | Admitting: Obstetrics & Gynecology

## 2014-12-27 MED ORDER — PROGESTERONE MICRONIZED 200 MG PO CAPS: ORAL_CAPSULE | ORAL | Status: DC

## 2014-12-27 MED ORDER — NONFORMULARY OR COMPOUNDED ITEM: Status: DC

## 2014-12-27 NOTE — Telephone Encounter (Signed)
Please apologize to her that it has taken this long.  Their compounding pharmacist isn't in the pharmacy every day and she and I have gone back and forth several times about what they do and then clarificaiton on orders.  What they hopefully will fill for her is a combination estrogen--biest 80/20 and it should be a cream in a syringe.  She will use 0.1mg  and rub into skin am and pm (that is how the prescription will read).  She can start with once daily and increase to twice daily if she needs.  If I do it this way on the prescription, [facility] isn't sure how much to give her.    Also, I am going to have to start her on micronized progesterone.  This will be a capsule.  This will be 200mg  nightly days 1-15 each month.  She may have bleeding and it if occurs, it will start after she stops the progesterone.  I don't want to add any testosterone yet.  Let's get started with these and see if this helps.    Also, if I get another question about the prescription, I am just going to send it to [facility].

## 2014-12-27 NOTE — Telephone Encounter (Signed)
Spoke with patient. Advised of message as seen below from Dr.Miller. Patient is agreeable and verbalizes understanding.  Routing to provider for final review. Patient agreeable to disposition. Will close encounter   

## 2014-12-27 NOTE — Telephone Encounter (Signed)
Left message to call Kaitlyn at [phone]. 

## 2014-12-27 NOTE — Telephone Encounter (Signed)
Pt checking status of previous message. States she needs to get started on the hormone replacement.   Medications need to be compounded at either [facility] or [facility].

## 2014-12-27 NOTE — Telephone Encounter (Signed)
Routing to Dr Miller for review.  

## 2014-12-28 ENCOUNTER — Telehealth: Payer: Self-pay | Admitting: *Deleted

## 2014-12-28 NOTE — Telephone Encounter (Signed)
Fax from [facility] wanting to clarify strength of Compounded estrogen.   "Also need a strength? (we usually disp in topiclides of each clich=0.25 mg)  Please advise strength Dr. Sabra Heck

## 2014-12-31 NOTE — Telephone Encounter (Signed)
Spoke with pharmacist and instruction clarified. This was done on Friday, June 17th.  Ok to close encounter.

## 2015-01-07 ENCOUNTER — Other Ambulatory Visit: Payer: Self-pay

## 2015-01-07 DIAGNOSIS — Z1231 Encounter for screening mammogram for malignant neoplasm of breast: Secondary | ICD-10-CM

## 2015-01-18 ENCOUNTER — Ambulatory Visit
Admission: RE | Admit: 2015-01-18 | Discharge: 2015-01-18 | Disposition: A | Discharge status: Home / Self Care | Payer: 59 | Source: Ambulatory Visit

## 2015-01-18 DIAGNOSIS — Z1231 Encounter for screening mammogram for malignant neoplasm of breast: Secondary | ICD-10-CM

## 2015-02-26 ENCOUNTER — Telehealth: Payer: Self-pay | Admitting: *Deleted

## 2015-02-26 ENCOUNTER — Ambulatory Visit (INDEPENDENT_AMBULATORY_CARE_PROVIDER_SITE_OTHER): Payer: 59 | Admitting: Obstetrics & Gynecology

## 2015-02-26 ENCOUNTER — Encounter: Payer: Self-pay | Admitting: Obstetrics & Gynecology

## 2015-02-26 VITALS — BP 120/78 | HR 64 | Resp 14 | Wt 140.0 lb

## 2015-02-26 DIAGNOSIS — N951 Menopausal and female climacteric states: Secondary | ICD-10-CM

## 2015-02-26 MED ORDER — PROGESTERONE MICRONIZED 100 MG PO CAPS: 100.0000 mg | ORAL_CAPSULE | Freq: Every day | ORAL | Status: DC

## 2015-02-26 NOTE — Telephone Encounter (Signed)
Returned patients call, left another message to call back -eh

## 2015-02-26 NOTE — Progress Notes (Signed)
54 y.o. Married Caucasian female G3P2 here for follow up after starting HRT.  Pt reports that her hot flashes are much better.  Since changing to bio-identicals, her pharmacy bill has increased due to [facility] not being the "Preferred compounding pharmacy for her plan".  She is feeling more "hormonal" and feels like she could cry much more easily with her current dosage.  As well, cycles have restarted.  Advised pt this will still again, eventually, but not uncommon for the bleeding to occur with HRT.  Reviewed risks again and she is comfortable with all of these--DVT/PE/stroke/MI/Breast cancer.  Pt and I discussed possible changes in HRT dosage.  Decided we will decrease both estrogen and progesterone dosages.  She would like to change the pharmacies where she is getting her Rx.  O: Healthy WD,WN female Affect: normal  A: Vasomotor symptoms  P: Change rx to 0.05mg  in 1 ml dosage of Biest 80/20 and decrease Prometrium to 100mg .  For now, she will still take it days 1-15.  [facility] and to [facility] per pt.   ~15 minutes spent with patient >50% of time was in face to face discussion of above.

## 2015-02-26 NOTE — Telephone Encounter (Signed)
Return call to Elaine. °

## 2015-02-26 NOTE — Telephone Encounter (Signed)
LMTC -eh  

## 2015-02-27 NOTE — Telephone Encounter (Signed)
Returning a call to [facility] [facility]. Please call cell number not home.

## 2015-02-28 NOTE — Telephone Encounter (Signed)
I spoke with patient and let her know that she will be getting her medication in a syringe so that the dose can be easily adjusted if needed. Patient voiced understanding- eh

## 2015-05-20 ENCOUNTER — Other Ambulatory Visit: Payer: Self-pay | Admitting: Obstetrics & Gynecology

## 2015-05-20 NOTE — Telephone Encounter (Signed)
Medication refill request: prometrium 100mg  Last AEX:  11-26-14 Next AEX: 02-11-16 Last MMG (if hormonal medication request): 01-18-15 category c density,birads 1:neg Refill authorized: pt started taking lower dose prometrium 100mg  day 1-15 on 8/16. Please approve if appropriate until patients aex 8/17

## 2015-09-11 DIAGNOSIS — C4499 Other specified malignant neoplasm of skin, unspecified: Secondary | ICD-10-CM

## 2015-09-11 HISTORY — PX: SQUAMOUS CELL CARCINOMA EXCISION: SHX2433

## 2015-09-11 HISTORY — DX: Other specified malignant neoplasm of skin, unspecified: C44.99

## 2016-02-11 ENCOUNTER — Encounter: Payer: Self-pay | Admitting: Obstetrics & Gynecology

## 2016-02-11 ENCOUNTER — Ambulatory Visit (INDEPENDENT_AMBULATORY_CARE_PROVIDER_SITE_OTHER): Payer: 59 | Admitting: Obstetrics & Gynecology

## 2016-02-11 VITALS — BP 140/90 | HR 64 | Resp 14 | Ht 63.25 in | Wt 138.0 lb

## 2016-02-11 DIAGNOSIS — Z23 Encounter for immunization: Secondary | ICD-10-CM

## 2016-02-11 DIAGNOSIS — Z0000 Encounter for general adult medical examination without abnormal findings: Secondary | ICD-10-CM

## 2016-02-11 DIAGNOSIS — Z01419 Encounter for gynecological examination (general) (routine) without abnormal findings: Secondary | ICD-10-CM | POA: Diagnosis not present

## 2016-02-11 DIAGNOSIS — N912 Amenorrhea, unspecified: Secondary | ICD-10-CM

## 2016-02-11 DIAGNOSIS — R6882 Decreased libido: Secondary | ICD-10-CM

## 2016-02-11 DIAGNOSIS — Z124 Encounter for screening for malignant neoplasm of cervix: Secondary | ICD-10-CM

## 2016-02-11 LAB — POCT URINALYSIS DIPSTICK
Bilirubin, UA: NEGATIVE
Glucose, UA: NEGATIVE
Ketones, UA: NEGATIVE
Leukocytes, UA: NEGATIVE
Nitrite, UA: NEGATIVE
Protein, UA: NEGATIVE
Urobilinogen, UA: NEGATIVE
pH, UA: 6

## 2016-02-11 MED ORDER — MEDROXYPROGESTERONE ACETATE 2.5 MG PO TABS: 2.5000 mg | ORAL_TABLET | Freq: Every day | ORAL | 4 refills | Status: DC

## 2016-02-11 NOTE — Progress Notes (Signed)
55 y.o. G3P2 MarriedCaucasianF here for annual exam.  Doing well.  HRT was changed in August.  Pt feels that the change has been good.   Sees Dr. Chalmers Cater regularly.  Last HbA1C was 6.0.       Patient's last menstrual period was 02/21/2015 (exact date).          Sexually active: Yes.    The current method of family planning is condoms most of the time.    Exercising: Yes.    yoga, weights, cardio Smoker:  no  Health Maintenance: Pap:  11/13/13 Neg. 2013 HR HPV:neg  History of abnormal Pap:  yes MMG:  01/18/15 BIRADS1:neg. Will call to schedule   Colonoscopy:  2013 f/u 10 years  BMD:   Never TDaP: today Pneumonia vaccine(s):  No Zostavax:   No Hep C testing: No Screening Labs: PCP, Urine today: Negative    reports that she has never smoked. She has never used smokeless tobacco. She reports that she drinks about 1.2 oz of alcohol per week . She reports that she does not use drugs.  Past Medical History:  Diagnosis Date  . Abnormal Pap smear of cervix    CIS of cervix  . Depression   . 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 Glaucoma        . Hyperlipidemia   . Hypothyroid   . 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 Shingles 2005    Past Surgical History:  Procedure Laterality Date  . cesarian section  1996  . GLAUCOMA REPAIR Bilateral 2011   Dr Ellie Lunch  . KNEE ARTHROSCOPY Right 4/90      . laser conization  6/90   CIS of cervix  . SHOULDER ARTHROSCOPY W/ CAPSULAR REPAIR  11/12   lt. shoulder  . SQUAMOUS CELL CARCINOMA EXCISION  09/2015   Right ear  . thumb surgery Left 1975    Current Outpatient Prescriptions  Medication Sig Dispense Refill  . ARMOUR THYROID 60 MG tablet Take 60 mg by mouth daily before breakfast.     . aspirin EC 81 MG tablet Take 81 mg by mouth daily.    . B Complex Vitamins (B COMPLEX PO) Take by mouth.    . Cetirizine HCl (ZYRTEC PO) Take by mouth as needed.     . cholecalciferol (VITAMIN D) 1000 UNITS tablet  Take 1,000 Units by mouth daily.    . CHROMIUM PO Take by mouth.    . Coenzyme Q10 (CO Q 10 PO) Take by mouth.    . Cyanocobalamin (VITAMIN B-12 PO) Take by mouth.    . fish oil-omega-3 fatty acids 1000 MG capsule Take 2 g by mouth daily.    Marland Kitchen MAGNESIUM PO Take by mouth.    . metFORMIN (GLUCOPHAGE) 1000 MG tablet Take 1,000 mg by mouth 2 (two) times daily with a meal.    . Multiple Vitamins-Minerals (MULTIVITAMIN WITH MINERALS) tablet Take 1 tablet by mouth daily.    . NONFORMULARY OR COMPOUNDED ITEM biest 80/20.  Apply 0.27ml am daily.  Can increase to AM and PM for symptom relief.  Disp:  1 month supply 1 each 2  . Probiotic Product (PROBIOTIC DAILY PO) Take by mouth daily.    . progesterone (PROMETRIUM) 100 MG capsule TAKE 1 CAPSULE (100 MG TOTAL) BY MOUTH DAILY. (Patient taking differently: TAKE 1 CAPSULE (100 MG TOTAL) BY MOUTH DAY 1 - 15) 30 capsule 6  . Turmeric 450 MG  CAPS Take by mouth 2 (two) times daily.     No current facility-administered medications for this visit.     Family History  Problem Relation Age of Onset  . Heart disease Father   . Hyperlipidemia Father   . Diabetes Son     type 1 in both sons  . Hypertension Mother     ROS:  Pertinent items are noted in HPI.  Otherwise, a comprehensive ROS was negative.  Exam:   BP 140/90 (BP Location: Right Arm, Patient Position: Sitting, Cuff Size: Normal)   Pulse 64   Resp 14   Ht 5' 3.25" (1.607 m)   Wt 138 lb (62.6 kg)   LMP 02/21/2015 (Exact Date)   BMI 24.25 kg/m    Height: 5' 3.25" (160.7 cm)  Ht Readings from Last 3 Encounters:  02/11/16 5' 3.25" (1.607 m)  11/26/14 5' 3.25" (1.607 m)  10/04/14 5' 3.25" (1.607 m)   General appearance: alert, cooperative and appears stated age Head: Normocephalic, without obvious abnormality, atraumatic Neck: no adenopathy, supple, symmetrical, trachea midline and thyroid normal to inspection and palpation Lungs: clear to auscultation bilaterally Breasts: normal appearance,  no masses or tenderness Heart: regular rate and rhythm Abdomen: soft, non-tender; bowel sounds normal; no masses,  no organomegaly Extremities: extremities normal, atraumatic, no cyanosis or edema Skin: Skin color, texture, turgor normal. No rashes or lesions Lymph nodes: Cervical, supraclavicular, and axillary nodes normal. No abnormal inguinal nodes palpated Neurologic: Grossly normal   Pelvic: External genitalia:  no lesions              Urethra:  normal appearing urethra with no masses, tenderness or lesions              Bartholins and Skenes: normal                 Vagina: normal appearing vagina with normal color and discharge, no lesions              Cervix: no lesions              Pap taken: Yes.   Bimanual Exam:  Uterus:  normal size, contour, position, consistency, mobility, non-tender              Adnexa: normal adnexa and no mass, fullness, tenderness               Rectovaginal: Confirms               Anus:  normal sphincter tone, no lesions  Chaperone was present for exam.  A:  Well Woman with normal exam PMP on HRT H/O CIS of cx, s/p laser conization 6/90 H/O Depression  H/O Diabetes  H/O Hypothyroidism  P: Mammogram yearly. D/W pt 3D MMG due to dense breast tissue Pap 2015. Neg HR HPV 2013.  Pap and HR HPV today. FSH and total testosterone today On Biest 80/20 0.5mg /ml.  1/2 to 6ml  Topically.  Will concentrate this further to make easier to use.  Pt will let me know when needs RF. Will consider topical testosterone depending on lab results. Tdap done today Change to Provera 2.5mg  daily.  #90/4RF.   Return annually or prn

## 2016-02-12 LAB — FOLLICLE STIMULATING HORMONE: FSH: 70.7 m[IU]/mL

## 2016-02-12 LAB — TESTOSTERONE, TOTAL, LC/MS/MS: Testosterone, Total, LC-MS-MS: 16 ng/dL (ref 2–45)

## 2016-02-13 LAB — IPS PAP TEST WITH HPV

## 2016-02-19 ENCOUNTER — Other Ambulatory Visit: Payer: Self-pay | Admitting: Obstetrics & Gynecology

## 2016-02-19 DIAGNOSIS — Z1231 Encounter for screening mammogram for malignant neoplasm of breast: Secondary | ICD-10-CM

## 2016-02-21 MED ORDER — NONFORMULARY OR COMPOUNDED ITEM: 1 refills | Status: DC

## 2016-02-21 NOTE — Addendum Note (Signed)
Addended by: Megan Salon on: 02/21/2016 05:13 PM   Modules accepted: Orders

## 2016-02-24 ENCOUNTER — Other Ambulatory Visit: Payer: Self-pay | Admitting: Obstetrics & Gynecology

## 2016-02-24 MED ORDER — NONFORMULARY OR COMPOUNDED ITEM: 12 refills | Status: DC

## 2016-02-24 NOTE — Addendum Note (Signed)
Addended by: Megan Salon on: 02/24/2016 09:16 AM   Modules accepted: Orders

## 2016-02-28 ENCOUNTER — Encounter: Payer: Self-pay | Admitting: Obstetrics & Gynecology

## 2016-02-28 ENCOUNTER — Telehealth: Payer: Self-pay | Admitting: *Deleted

## 2016-02-28 NOTE — Telephone Encounter (Signed)
Called patient to let her know that [facility] had questions about the prescription they wanted to review with Dr. Sabra Heck before filling. Informed patient that RN just faxed response back to [facility] and that they opened at 0900 today. Patient verbalized understanding and appreciative of call.   Routing to provider for final review. Patient agreeable to disposition. Will close encounter.

## 2016-03-02 ENCOUNTER — Ambulatory Visit: Payer: 59

## 2016-03-09 ENCOUNTER — Ambulatory Visit
Admission: RE | Admit: 2016-03-09 | Discharge: 2016-03-09 | Disposition: A | Discharge status: Home / Self Care | Payer: 59 | Source: Ambulatory Visit | Attending: Obstetrics & Gynecology | Admitting: Obstetrics & Gynecology

## 2016-03-09 DIAGNOSIS — Z1231 Encounter for screening mammogram for malignant neoplasm of breast: Secondary | ICD-10-CM

## 2016-05-18 ENCOUNTER — Other Ambulatory Visit: Payer: 59

## 2016-05-18 DIAGNOSIS — R6882 Decreased libido: Secondary | ICD-10-CM

## 2016-05-23 LAB — TESTOS,TOTAL,FREE AND SHBG (FEMALE)
Sex Hormone Binding Glob.: 79 nmol/L (ref 17–124)
Testosterone, Free: 2.3 pg/mL (ref 0.1–6.4)
Testosterone,Total,LC/MS/MS: 28 ng/dL (ref 2–45)

## 2016-06-22 DIAGNOSIS — Z0289 Encounter for other administrative examinations: Secondary | ICD-10-CM

## 2017-03-05 ENCOUNTER — Other Ambulatory Visit: Payer: Self-pay | Admitting: Obstetrics & Gynecology

## 2017-03-05 NOTE — Telephone Encounter (Signed)
Medication refill request: provera  Last AEX:  02/11/16 SM Next AEX: 05/11/17 SM Last MMG (if hormonal medication request): 03/09/16 BIRADS1:neg  Refill authorized: 02/11/16 #[id number]. Today please advise.

## 2017-03-08 ENCOUNTER — Other Ambulatory Visit: Payer: Self-pay | Admitting: Obstetrics & Gynecology

## 2017-03-08 DIAGNOSIS — Z1231 Encounter for screening mammogram for malignant neoplasm of breast: Secondary | ICD-10-CM

## 2017-03-25 ENCOUNTER — Other Ambulatory Visit: Payer: Self-pay | Admitting: *Deleted

## 2017-03-25 MED ORDER — NONFORMULARY OR COMPOUNDED ITEM: 1 refills | Status: DC

## 2017-03-25 NOTE — Telephone Encounter (Signed)
Patient calling for a refill on her compounded estrogen creme. Would like [facility].

## 2017-03-25 NOTE — Telephone Encounter (Signed)
Medication refill request: Estrogen Cream Last AEX:  02-11-16  Next AEX: 05-01-17  Last MMG (if hormonal medication request): 03-09-16 WNL Scheduled MMG 03-29-17  Refill authorized: please advise

## 2017-03-29 ENCOUNTER — Ambulatory Visit
Admission: RE | Admit: 2017-03-29 | Discharge: 2017-03-29 | Disposition: A | Discharge status: Home / Self Care | Payer: 59 | Source: Ambulatory Visit | Attending: Obstetrics & Gynecology | Admitting: Obstetrics & Gynecology

## 2017-03-29 ENCOUNTER — Telehealth: Payer: Self-pay | Admitting: Obstetrics & Gynecology

## 2017-03-29 ENCOUNTER — Other Ambulatory Visit: Payer: Self-pay | Admitting: Obstetrics & Gynecology

## 2017-03-29 DIAGNOSIS — Z1231 Encounter for screening mammogram for malignant neoplasm of breast: Secondary | ICD-10-CM

## 2017-03-29 MED ORDER — NONFORMULARY OR COMPOUNDED ITEM: 12 refills | Status: DC

## 2017-03-29 NOTE — Telephone Encounter (Signed)
Rx for testosterone ointment and biest faxed to [facility] at [phone].  Left detailed message, ok per current dpr. Advised as seen below per Dr. Sabra Heck. Advised patient to f/u with [facility] for filling. May return call to office at [phone] for any additional questions.   Will close encounter.

## 2017-03-29 NOTE — Telephone Encounter (Signed)
Patient says she called [facility] and they do not have the prescription that was sent on the 13th.

## 2017-03-29 NOTE — Telephone Encounter (Signed)
Spoke with patient. Patient calling to f/u with refill request for Biest 80/20 requested on 03/25/17. Advised patient RX for Testosterone ointment placed and awaiting signature for faxing. Patient states she does not need refill of testosterone ointment, only Biest. Advised patient will review request with Dr. Sabra Heck and return call with recommendations once reviewed, patient agreeable.  Confirmed RX to be sent to [facility].  Dr. Sabra Heck -please advise on refill of biest?

## 2017-03-29 NOTE — Telephone Encounter (Signed)
Refill has been faxed for the Biest.  Please let her know I am sorry this was delayed.  The request that came from the pharmacy was only for the testosterone.  So both have been faxed into [facility].  Thanks.

## 2017-03-30 ENCOUNTER — Telehealth: Payer: Self-pay | Admitting: *Deleted

## 2017-03-30 NOTE — Telephone Encounter (Signed)
Pharmacist, "Ed", calling from [facility] for clarification of RX Biest.   Reviewed previous RX for biest 80/20 dated 12/27/14 and 02/24/16, apply 0.31ml am daily, can increase to AM and PM for symptom relief, no changes in dosing from previous RX.   Ed reports patient has been applying biest 80/20 0.05 mg/ml; 1 ml daily to inner arm, would like further clarification of strength.   Advised will review with Dr. Sabra Heck and return call. Advised Dr. Sabra Heck is in surgery, response may not be immediate, Ed verbalizes understanding.   Dr. Sabra Heck -please clarify Biest 80/20 strength?

## 2017-03-31 NOTE — Telephone Encounter (Signed)
Betsy calling from [facility] to f/u on new Rx for biest 80/20. Calling to confirm strength.  Gwinda Passe reports previous Rx 0.5mg /ml, applying 1 ml daily. New Rx no strength, apply 0.18ml daily.   Advised Betsy Dr. Sabra Heck is out of the office, will review with covering provider and return call with recommendations.  Dr. Quincy Simmonds -please review and advise?  Cc: Dr. Sabra Heck

## 2017-03-31 NOTE — Telephone Encounter (Signed)
Called [facility] personally and advised to use the 0.5mg /ml applying 7ml daily dosing.  Ok to close encounter.    CC:  Dr. Quincy Simmonds just FYI this is handled.

## 2017-05-11 ENCOUNTER — Encounter: Payer: Self-pay | Admitting: Obstetrics & Gynecology

## 2017-05-11 ENCOUNTER — Ambulatory Visit (INDEPENDENT_AMBULATORY_CARE_PROVIDER_SITE_OTHER): Payer: 59 | Admitting: Obstetrics & Gynecology

## 2017-05-11 VITALS — BP 138/92 | HR 72 | Resp 14 | Ht 63.0 in | Wt 136.0 lb

## 2017-05-11 DIAGNOSIS — Z205 Contact with and (suspected) exposure to viral hepatitis: Secondary | ICD-10-CM | POA: Diagnosis not present

## 2017-05-11 DIAGNOSIS — Z79890 Hormone replacement therapy (postmenopausal): Secondary | ICD-10-CM | POA: Diagnosis not present

## 2017-05-11 DIAGNOSIS — Z0000 Encounter for general adult medical examination without abnormal findings: Secondary | ICD-10-CM | POA: Diagnosis not present

## 2017-05-11 DIAGNOSIS — Z01419 Encounter for gynecological examination (general) (routine) without abnormal findings: Secondary | ICD-10-CM | POA: Diagnosis not present

## 2017-05-11 NOTE — Progress Notes (Signed)
56 y.o. G3P2 MarriedCaucasianF here for annual exam.  Doing well.  Denies vaginal bleeding.  Would like compounded hormone to be thicker if possible.  Will need to contact [facility] about this.  Last appt with Dr. Chalmers Cater.  Seen every six months. HbA1c was 6.4.  Morning blood sugars are in the 130's to 140's.  Has cholesterol testing as well.     Patient's last menstrual period was 02/21/2015 (exact date).          Sexually active: Yes.    The current method of family planning is post menopausal status.    Exercising: Yes.    cardio, weights Smoker:  no  Health Maintenance: Pap:  02/11/16 negative, HR HPV negative, 11/13/13 negative History of abnormal Pap:  yes MMG:  03/29/17 BIRADS 1 negative  Colonoscopy:  12/23/11 normal, repeat 10 years  BMD:   never TDaP:  02/11/16  Pneumonia vaccine(s):  never Zostavax:   never Hep C testing: discuss with provider Screening Labs: Dr.Balan   reports that she has never smoked. She has never used smokeless tobacco. She reports that she drinks about 1.2 oz of alcohol per week . She reports that she does not use drugs.  Past Medical History:  Diagnosis Date  . Abnormal Pap smear of cervix    CIS of cervix  . Depression   . 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 Glaucoma        . Hyperlipidemia   . Hypothyroid   . 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 Shingles 2005    Past Surgical History:  Procedure Laterality Date  . cesarian section  1996  . GLAUCOMA REPAIR Bilateral 2011   Dr Ellie Lunch  . KNEE ARTHROSCOPY Right 4/90      . laser conization  6/90   CIS of cervix  . SHOULDER ARTHROSCOPY W/ CAPSULAR REPAIR  11/12   lt. shoulder  . SQUAMOUS CELL CARCINOMA EXCISION  09/2015   Right ear  . thumb surgery Left 1975    Current Outpatient Prescriptions  Medication Sig Dispense Refill  . ARMOUR THYROID 60 MG tablet Take 60 mg by mouth daily before breakfast.     . aspirin EC 81 MG tablet Take  81 mg by mouth daily.    . B Complex Vitamins (B COMPLEX PO) Take by mouth.    . Cetirizine HCl (ZYRTEC PO) Take by mouth as needed.     . cholecalciferol (VITAMIN D) 1000 UNITS tablet Take 1,000 Units by mouth daily.    . CHROMIUM PO Take by mouth.    . Coenzyme Q10 (CO Q 10 PO) Take by mouth.    . Cyanocobalamin (VITAMIN B-12 PO) Take by mouth.    . fish oil-omega-3 fatty acids 1000 MG capsule Take 2 g by mouth daily.    Marland Kitchen MAGNESIUM PO Take by mouth.    . medroxyPROGESTERone (PROVERA) 2.5 MG tablet TAKE 1 TABLET BY MOUTH DAILY. 30 tablet 3  . metFORMIN (GLUCOPHAGE) 1000 MG tablet Take 1,000 mg by mouth 2 (two) times daily with a meal.    . Multiple Vitamins-Minerals (MULTIVITAMIN WITH MINERALS) tablet Take 1 tablet by mouth daily.    . NONFORMULARY OR COMPOUNDED ITEM 2% Topical testosterone propionate in petrolatum.  Apply 1/4 tsp to skin two to three times weekly.  Disp:  30gm 1 each 1  . NONFORMULARY OR COMPOUNDED ITEM biest 80/20.  Apply 0.72ml am daily.  Can increase  to AM and PM for symptom relief.  Disp:  1 month supply 1 each 12  . Probiotic Product (PROBIOTIC DAILY PO) Take by mouth daily.    . Turmeric 450 MG CAPS Take by mouth 2 (two) times daily.    . Continuous Blood Gluc Sensor (FREESTYLE LIBRE SENSOR SYSTEM) MISC USE AS DIRECTED EVERY 10 DAYS  12   No current facility-administered medications for this visit.     Family History  Problem Relation Age of Onset  . Heart disease Father   . Hyperlipidemia Father   . Diabetes Son        type 1 in both sons  . Hypertension Mother     ROS:  Pertinent items are noted in HPI.  Otherwise, a comprehensive ROS was negative.  Exam:   BP (!) 138/92 (BP Location: Right Arm, Patient Position: Sitting, Cuff Size: Normal)   Pulse 72   Resp 14   Ht 5\' 3"  (1.6 m)   Wt 136 lb (61.7 kg)   LMP 02/21/2015 (Exact Date)   BMI 24.09 kg/m   Weight change: -2#  Height: 5\' 3"  (160 cm)  Ht Readings from Last 3 Encounters:  05/11/17 5\' 3"  (1.6  m)  02/11/16 5' 3.25" (1.607 m)  11/26/14 5' 3.25" (1.607 m)    General appearance: alert, cooperative and appears stated age Head: Normocephalic, without obvious abnormality, atraumatic Neck: no adenopathy, supple, symmetrical, trachea midline and thyroid normal to inspection and palpation Lungs: clear to auscultation bilaterally Breasts: normal appearance, no masses or tenderness Heart: regular rate and rhythm Abdomen: soft, non-tender; bowel sounds normal; no masses,  no organomegaly Extremities: extremities normal, atraumatic, no cyanosis or edema Skin: Skin color, texture, turgor normal. No rashes or lesions Lymph nodes: Cervical, supraclavicular, and axillary nodes normal. No abnormal inguinal nodes palpated Neurologic: Grossly normal   Pelvic: External genitalia:  no lesions              Urethra:  normal appearing urethra with no masses, tenderness or lesions              Bartholins and Skenes: normal                 Vagina: normal appearing vagina with normal color and discharge, no lesions              Cervix: no lesions              Pap taken: No. Bimanual Exam:  Uterus:  normal size, contour, position, consistency, mobility, non-tender              Adnexa: normal adnexa and no mass, fullness, tenderness               Rectovaginal: Confirms               Anus:  normal sphincter tone, no lesions  Chaperone was present for exam.  A:  Well Woman with normal exam PMP, on HRT H/O CIS of cervix, s/p laser conization 6/90.  Normal pap smears since Depression history Diabetes Hypothyroidism  P:   Mammogram guidelines reviewed.  Recommended 3D MMG due to Grade C breast density pap smear not indicated this year.  Neg pap and neg HR HPV 2017 Total testosterone, estradiol obtained today Vit D obtained today CBC and Hep C antibody obtained today Does not need RF for Biest 80/20 0.5mg /ml.  1/2 to 45ml topically daily.  Desires to continue on HRT.  Aware of risks. No RF needed  for topical testosterone as well. On provera 2.5mg  daily.  RX completed. Return annually or prn

## 2017-05-12 MED ORDER — MEDROXYPROGESTERONE ACETATE 2.5 MG PO TABS: 2.5000 mg | ORAL_TABLET | Freq: Every day | ORAL | 12 refills | Status: DC

## 2017-05-14 LAB — VITAMIN D 25 HYDROXY (VIT D DEFICIENCY, FRACTURES): Vit D, 25-Hydroxy: 49.2 ng/mL (ref 30.0–100.0)

## 2017-05-14 LAB — CBC
Hematocrit: 39.2 % (ref 34.0–46.6)
Hemoglobin: 13.1 g/dL (ref 11.1–15.9)
MCH: 30.4 pg (ref 26.6–33.0)
MCHC: 33.4 g/dL (ref 31.5–35.7)
MCV: 91 fL (ref 79–97)
Platelets: 277 10*3/uL (ref 150–379)
RBC: 4.31 x10E6/uL (ref 3.77–5.28)
RDW: 13 % (ref 12.3–15.4)
WBC: 7.1 10*3/uL (ref 3.4–10.8)

## 2017-05-14 LAB — ESTRADIOL: Estradiol: <5 pg/mL

## 2017-05-14 LAB — HEPATITIS C ANTIBODY: Hep C Virus Ab: <0.1 s/co ratio (ref 0.0–0.9)

## 2017-05-14 LAB — TESTOSTERONE, TOTAL, LC/MS/MS: Testosterone, total: 17.1 ng/dL

## 2017-05-26 ENCOUNTER — Telehealth: Payer: Self-pay

## 2017-05-26 NOTE — Telephone Encounter (Signed)
-----   Message from Megan Salon, MD sent at 05/26/2017  9:39 AM EST ----- Pt has probably already seen her lab work as they have been released in [place] but [place] like you to please call her.  Her CBC and Vit D were normal.  Her Hep C was negative.  Her estradiol level is undetectable and her testosterone level is good.  She's using a compounded cream from [facility] and I did call them.  They cannot make it any thicker.  They can concentrate it more.  With her estradiol level being so low, I'm wondering about how well the absorption is for her anyway.  Would she consider letting me try a patch or oral form (I would prefer patch) to see how she feels with this different method of HRT.  She can still use the testosterone as she is doing.

## 2017-05-26 NOTE — Telephone Encounter (Signed)
Left message to call Kaitlyn at [phone]. 

## 2017-05-31 ENCOUNTER — Other Ambulatory Visit: Payer: Self-pay | Admitting: Obstetrics & Gynecology

## 2017-05-31 MED ORDER — ESTRADIOL 0.075 MG/24HR TD PTTW: 1.0000 | MEDICATED_PATCH | TRANSDERMAL | 12 refills | Status: DC

## 2017-05-31 NOTE — Telephone Encounter (Signed)
Call to patient. Results reviewed with patient as seen below from Dr. Sabra Heck and she verbalized understanding. Patient states she would be open to trying a patch for HRT. Pharmacy on file verified. RN advised would update Dr. Sabra Heck and return call with any additional recommendations. Patient agreeable.   Routing to provider for review.

## 2017-05-31 NOTE — Telephone Encounter (Signed)
Call to patient. Message given to patient as seen below from Dr. Sabra Heck. Prescription for vivelle dot 0.075mg  patches, #8, 12 RF[facility] [address] [facility] patient preference. Instructions on use reviewed with patient and she verbalized understanding. Patient aware to call in one month to give update.   Routing to provider for final review. Patient agreeable to disposition. Will close encounter.

## 2017-05-31 NOTE — Telephone Encounter (Signed)
Ok to send in rx for Vivelle dot 0.075mg  patches to skin twice weekly.  I'm not sure what pharmacy she wants this sent to.  Let her know the generic for this works well and it is ok to not use the branded one.  Thanks.  I'd love an update in a month or so from her.  Thanks.

## 2017-08-09 ENCOUNTER — Other Ambulatory Visit: Payer: Self-pay | Admitting: Obstetrics & Gynecology

## 2017-08-09 NOTE — Telephone Encounter (Signed)
Message left to return call to Emily at [phone].    

## 2017-08-10 NOTE — Telephone Encounter (Signed)
Medication refill request: Provera Last AEX:  05/11/17 SM  Next AEX: 07/29/18  Last MMG (if hormonal medication request): 03/29/17 BIRADS 1 negative  Refill authorized: Today, please advise.   Patient returned call. Patient states her provera prescription needs to be sent to [facility]. Does need refill at this time.

## 2017-08-11 ENCOUNTER — Other Ambulatory Visit: Payer: Self-pay | Admitting: Obstetrics & Gynecology

## 2017-09-22 ENCOUNTER — Other Ambulatory Visit: Payer: Self-pay

## 2017-09-22 NOTE — Telephone Encounter (Signed)
Medication refill request: Testosterone cream Last AEX: 05-11-17 Next AEX: 07-29-18 Last MMG (if hormonal medication request): 03-29-17 [id number] Refill authorized: Please advise

## 2017-09-23 MED ORDER — NONFORMULARY OR COMPOUNDED ITEM: 1 refills | Status: DC

## 2018-03-29 ENCOUNTER — Other Ambulatory Visit: Payer: Self-pay | Admitting: *Deleted

## 2018-03-29 NOTE — Telephone Encounter (Signed)
Fax refill request received for the following:   Medication refill request: testosterone ointment Last AEX:  05-11-17 SM  Next AEX: 07-29-18  Last MMG (if hormonal medication request): 03-29-17 density C/BIRADS 1 negative  Refill authorized: please advise  Medication pended to print to be faxed to [facility] once approved.

## 2018-03-31 ENCOUNTER — Other Ambulatory Visit: Payer: Self-pay

## 2018-03-31 NOTE — Telephone Encounter (Signed)
Medication refill request: testosterone 2% Last AEX:  05/11/17 Next AEX: 07/29/18 Last MMG (if hormonal medication request): 03/29/17  Bi-rads category 1 neg  Refill authorized: Please advise

## 2018-04-01 MED ORDER — NONFORMULARY OR COMPOUNDED ITEM: 1 refills | Status: DC

## 2018-04-01 NOTE — Telephone Encounter (Signed)
This has been completed.  Thanks. 

## 2018-04-04 ENCOUNTER — Telehealth: Payer: Self-pay | Admitting: Obstetrics & Gynecology

## 2018-04-04 NOTE — Telephone Encounter (Signed)
Per review of refill encounter dated 9/17, RX faxed 9/23. Spoke with Misty at [facility]. Was advised Rx received for testosterone cream.   Call to patient, advised as seen above. Advised to allow 48hrs for pharmacy to prepare medication, they will call when ready. Patient verbalizes understanding and is agreeable.  Encounter closed.

## 2018-04-04 NOTE — Telephone Encounter (Signed)
Patient called requesting to speak with a nurse about refills on her testosterone cream that were denied from [facility].

## 2018-04-04 NOTE — Telephone Encounter (Signed)
Rx faxed today 

## 2018-04-20 ENCOUNTER — Other Ambulatory Visit: Payer: Self-pay | Admitting: Obstetrics & Gynecology

## 2018-04-20 DIAGNOSIS — Z1231 Encounter for screening mammogram for malignant neoplasm of breast: Secondary | ICD-10-CM

## 2018-05-11 ENCOUNTER — Ambulatory Visit
Admission: RE | Admit: 2018-05-11 | Discharge: 2018-05-11 | Disposition: A | Discharge status: Home / Self Care | Payer: 59 | Source: Ambulatory Visit | Attending: Obstetrics & Gynecology | Admitting: Obstetrics & Gynecology

## 2018-05-11 DIAGNOSIS — Z1231 Encounter for screening mammogram for malignant neoplasm of breast: Secondary | ICD-10-CM

## 2018-06-13 ENCOUNTER — Other Ambulatory Visit: Payer: Self-pay | Admitting: Obstetrics & Gynecology

## 2018-06-13 NOTE — Telephone Encounter (Signed)
Medication refill request: Vivelle Dot  Last AEX:  05/11/17  Next AEX: 07/29/18 Last MMG (if hormonal medication request): 05/11/18 Bi-rads 1 Neg  Refill authorized: #8 with 1 RF to get her to her AEX

## 2018-07-24 ENCOUNTER — Other Ambulatory Visit: Payer: Self-pay | Admitting: Obstetrics & Gynecology

## 2018-07-29 ENCOUNTER — Other Ambulatory Visit: Payer: Self-pay

## 2018-07-29 ENCOUNTER — Encounter: Payer: Self-pay | Admitting: Obstetrics & Gynecology

## 2018-07-29 ENCOUNTER — Encounter

## 2018-07-29 ENCOUNTER — Ambulatory Visit: Payer: 59 | Admitting: Obstetrics & Gynecology

## 2018-07-29 ENCOUNTER — Other Ambulatory Visit (HOSPITAL_COMMUNITY)
Admission: RE | Admit: 2018-07-29 | Discharge: 2018-07-29 | Disposition: A | Discharge status: Home / Self Care | Payer: 59 | Source: Ambulatory Visit | Attending: Obstetrics & Gynecology | Admitting: Obstetrics & Gynecology

## 2018-07-29 VITALS — BP 108/66 | HR 68 | Resp 16 | Ht 63.0 in | Wt 136.0 lb

## 2018-07-29 DIAGNOSIS — Z124 Encounter for screening for malignant neoplasm of cervix: Secondary | ICD-10-CM | POA: Diagnosis not present

## 2018-07-29 DIAGNOSIS — N898 Other specified noninflammatory disorders of vagina: Secondary | ICD-10-CM | POA: Diagnosis not present

## 2018-07-29 DIAGNOSIS — Z01419 Encounter for gynecological examination (general) (routine) without abnormal findings: Secondary | ICD-10-CM | POA: Diagnosis not present

## 2018-07-29 MED ORDER — ESTRADIOL 0.075 MG/24HR TD PTTW: 1.0000 | MEDICATED_PATCH | TRANSDERMAL | 13 refills | Status: DC

## 2018-07-29 NOTE — Progress Notes (Signed)
58 y.o. G3P2 Married White or Caucasian female here for annual exam.  Denies vaginal bleeding.  In the summer, she went on an antidepressant.    PCP:  Dr. Darron Doom.  Had physical in October.  Had blood work then.  Saw Dr. Chalmers Cater.  HbA1C was 6.4.    Patient's last menstrual period was 02/21/2015 (exact date).          Sexually active: Yes.    The current method of family planning is post menopausal status.    Exercising: Yes.    weights, cario, strength  Smoker:  no  Health Maintenance: Pap:  02/11/16 Neg. HR HPV:neg   11/13/13 Neg  History of abnormal Pap:  Yes,  CIS of Cx, s/p laser conization 6/90 MMG:  05/11/18 BIRADS1:Neg  Colonoscopy:  12/23/11 Normal. F/u 10 years  BMD:   Never TDaP:  2017 Pneumonia vaccine(s):  n/a Shingrix:   Had first on on 06/12/2018 Hep C testing: 05/11/17 Neg  Screening Labs: PCP   reports that she has never smoked. She has never used smokeless tobacco. She reports current alcohol use of about 2.0 standard drinks of alcohol per week. She reports that she does not use drugs.  Past Medical History:  Diagnosis Date  . Abnormal Pap smear of cervix    CIS of cervix  . Depression   . 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 Glaucoma        . Hyperlipidemia   . Hypothyroid   . 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 Shingles 2005    Past Surgical History:  Procedure Laterality Date  . cesarian section  1996  . GLAUCOMA REPAIR Bilateral 2011   Dr Ellie Lunch  . KNEE ARTHROSCOPY Right 4/90      . laser conization  6/90   CIS of cervix  . SHOULDER ARTHROSCOPY W/ CAPSULAR REPAIR  11/12   lt. shoulder  . SQUAMOUS CELL CARCINOMA EXCISION  09/2015   Right ear  . thumb surgery Left 1975    Current Outpatient Medications  Medication Sig Dispense Refill  . ARMOUR THYROID 60 MG tablet Take 60 mg by mouth daily before breakfast.     . aspirin EC 81 MG tablet Take 81 mg by mouth daily.    . B Complex Vitamins (B COMPLEX PO)  Take by mouth.    . Cetirizine HCl (ZYRTEC PO) Take by mouth as needed.     . cholecalciferol (VITAMIN D) 1000 UNITS tablet Take 1,000 Units by mouth daily.    . Coenzyme Q10 (CO Q 10 PO) Take by mouth.    . Continuous Blood Gluc Sensor (FREESTYLE LIBRE SENSOR SYSTEM) MISC USE AS DIRECTED EVERY 10 DAYS  12  . Cyanocobalamin (VITAMIN B-12 PO) Take by mouth.    . fish oil-omega-3 fatty acids 1000 MG capsule Take 2 g by mouth daily.    Marland Kitchen JARDIANCE 10 MG TABS tablet Take 10 mg by mouth daily.    Marland Kitchen lamoTRIgine (LAMICTAL) 25 MG tablet Take 2 tablets by mouth daily.    Marland Kitchen losartan (COZAAR) 50 MG tablet Take 1 tablet by mouth daily.    Marland Kitchen MAGNESIUM PO Take by mouth.    . medroxyPROGESTERone (PROVERA) 2.5 MG tablet TAKE ONE TABLET BY MOUTH ONE TIME DAILY  90 tablet 3  . metFORMIN (GLUCOPHAGE) 1000 MG tablet Take 1,000 mg by mouth 2 (two) times daily with a meal.    . Multiple Vitamins-Minerals (MULTIVITAMIN WITH  MINERALS) tablet Take 1 tablet by mouth daily.    . NONFORMULARY OR COMPOUNDED ITEM 2% Topical testosterone propionate in petrolatum.  Apply 1/4 tsp to skin two to three times weekly.  Disp:  30gm 1 each 1  . Probiotic Product (PROBIOTIC DAILY PO) Take by mouth daily.    . Turmeric 450 MG CAPS Take by mouth 2 (two) times daily.    Marland Kitchen VIVELLE-DOT 0.075 MG/24HR PLACE 1 PATCH 2 (TWO) TIMES A WEEK ONTO THE SKIN. 8 patch 1   No current facility-administered medications for this visit.     Family History  Problem Relation Age of Onset  . Heart disease Father   . Hyperlipidemia Father   . Diabetes Son        type 1 in both sons  . Hypertension Mother     Review of Systems  Endocrine: Positive for cold intolerance and heat intolerance.       Craving sweets   All other systems reviewed and are negative.   Exam:   BP 108/66 (BP Location: Left Arm, Patient Position: Sitting, Cuff Size: Normal)   Pulse 68   Resp 16   Ht 5\' 3"  (1.6 m)   Wt 136 lb (61.7 kg)   LMP 02/21/2015 (Exact Date)    BMI 24.09 kg/m   Height: 5\' 3"  (160 cm)  Ht Readings from Last 3 Encounters:  07/29/18 5\' 3"  (1.6 m)  05/11/17 5\' 3"  (1.6 m)  02/11/16 5' 3.25" (1.607 m)    General appearance: alert, cooperative and appears stated age Head: Normocephalic, without obvious abnormality, atraumatic Neck: no adenopathy, supple, symmetrical, trachea midline and thyroid normal to inspection and palpation Lungs: clear to auscultation bilaterally Breasts: normal appearance, no masses or tenderness Heart: regular rate and rhythm Abdomen: soft, non-tender; bowel sounds normal; no masses,  no organomegaly Extremities: extremities normal, atraumatic, no cyanosis or edema Skin: Skin color, texture, turgor normal. No rashes or lesions Lymph nodes: Cervical, supraclavicular, and axillary nodes normal. No abnormal inguinal nodes palpated Neurologic: Grossly normal   Pelvic: External genitalia:  no lesions              Urethra:  normal appearing urethra with no masses, tenderness or lesions              Bartholins and Skenes: normal                 Vagina: normal appearing vagina with normal color and discharge, no lesions              Cervix: no lesions              Pap taken: Yes.   Bimanual Exam:  Uterus:  normal size, contour, position, consistency, mobility, non-tender              Adnexa: normal adnexa and no mass, fullness, tenderness               Rectovaginal: Confirms               Anus:  normal sphincter tone, no lesions  Chaperone was present for exam.  A:  Well Woman with normal exam PMP, on HRT H/o CIS of cervix, s/p laser conization 6/90.  Normal pap smears since H/o depression Diabetes Hypothyroidisim  P:   Mammogram guidelines reviewed.  Doing 3D due to breast density pap smear obtained today Considering changing to generic patch for estradiol as vivelle dot does have some cost for her.  Rx for Vivelle  0.075mg  patches , twice weekly to skin to pharmacy.  #8/13RF. Provera 2.5mg  daily.   #90/4RF to pharmacy No RF for topical testotserone needed at this time Vaginitis testing pending Lab work done with Dr. Chalmers Cater and Dr. Darron Doom Return annually or prn

## 2018-07-30 LAB — VAGINITIS/VAGINOSIS, DNA PROBE
Candida Species: POSITIVE — AB
Gardnerella vaginalis: NEGATIVE
Trichomonas vaginosis: NEGATIVE

## 2018-07-30 MED ORDER — FLUCONAZOLE 150 MG PO TABS: 150.0000 mg | ORAL_TABLET | Freq: Once | ORAL | 0 refills | Status: AC

## 2018-08-01 ENCOUNTER — Encounter: Payer: Self-pay | Admitting: Obstetrics & Gynecology

## 2018-08-01 NOTE — Telephone Encounter (Signed)
Patient sent the following correspondence through [place]. Routing to triage to assist patient with request.  Thanks for calling in the diflucan! I asked the pharmacy about the generic estrogen patch, and they have plenty, so if you send a [facility] over [facility] generic [facility] [address] they'll fill it. I appreciate it!  Jana Half

## 2018-08-01 NOTE — Telephone Encounter (Signed)
Patient has been notified that the prescription for generic estrogen patches has been sent to the pharmacy.

## 2018-08-02 LAB — CYTOLOGY - PAP: Diagnosis: NEGATIVE

## 2018-09-28 ENCOUNTER — Other Ambulatory Visit: Payer: Self-pay | Admitting: Obstetrics & Gynecology

## 2018-09-28 NOTE — Telephone Encounter (Signed)
Patient is asking a [facility] [address]. She states that her previous prescription has expired from [facility] and they would not transfer the prescription.

## 2018-09-28 NOTE — Telephone Encounter (Signed)
Medication refill request: Provera Last AEX:  07/29/18 SM Next AEX: 12/07/19 Last MMG (if hormonal medication request): 05/11/18 BIRADS 1 negative/density c Refill authorized: 08/11/17 #90 w/3 refills; today order pended for #90 w/0 refills if authorized

## 2018-09-30 MED ORDER — MEDROXYPROGESTERONE ACETATE 2.5 MG PO TABS: 2.5000 mg | ORAL_TABLET | Freq: Every day | ORAL | 4 refills | Status: DC

## 2018-09-30 NOTE — Telephone Encounter (Signed)
Patient has been notified

## 2019-06-06 ENCOUNTER — Other Ambulatory Visit: Payer: Self-pay | Admitting: Obstetrics & Gynecology

## 2019-06-06 DIAGNOSIS — Z1231 Encounter for screening mammogram for malignant neoplasm of breast: Secondary | ICD-10-CM

## 2019-08-01 ENCOUNTER — Ambulatory Visit
Admission: RE | Admit: 2019-08-01 | Discharge: 2019-08-01 | Disposition: A | Discharge status: Home / Self Care | Payer: 59 | Source: Ambulatory Visit | Attending: Obstetrics & Gynecology | Admitting: Obstetrics & Gynecology

## 2019-08-01 ENCOUNTER — Other Ambulatory Visit: Payer: Self-pay

## 2019-08-01 DIAGNOSIS — Z1231 Encounter for screening mammogram for malignant neoplasm of breast: Secondary | ICD-10-CM

## 2019-08-27 ENCOUNTER — Other Ambulatory Visit: Payer: Self-pay | Admitting: Obstetrics & Gynecology

## 2019-08-28 NOTE — Telephone Encounter (Signed)
Medication refill request: Vivelle- dot Last AEX:  07-29-2018 SM  Next AEX: 12-07-19 Last MMG (if hormonal medication request): 08-01-19 BIRADS 1 negative Refill authorized: Today, please advise.   Medication pended for #24, 0RF. Please refill if appropriate.

## 2019-09-20 ENCOUNTER — Encounter: Payer: Self-pay | Admitting: Obstetrics & Gynecology

## 2019-09-20 ENCOUNTER — Telehealth: Payer: Self-pay | Admitting: *Deleted

## 2019-09-20 NOTE — Telephone Encounter (Signed)
See telephone encounter dated 09/20/19.   Encounter closed.  

## 2019-09-20 NOTE — Telephone Encounter (Signed)
Patient is requesting refill of compounded testosterone cream.  Last AEX 07/29/18 Next AEX 12/07/19 Last testosterone level 05/11/17  Call to patient, advised she will need updated testosterone level for refill. Offered to move AEX to earlier date, patient agreeable. AEX r/s to 10/03/19 at 11am. Patient states she does have her labs drawn with PCP, she is going to check and see if she has had a testosterone level drawn recently, will have results faxed if she did. Advised patient I will update Dr. Sabra Heck and f/u if any additional recommendations, patient agreeable.   Routing to provider for final review. Patient is agreeable to disposition. Will close encounter.

## 2019-09-20 NOTE — Telephone Encounter (Signed)
[place] [place] [place] [facility]  Phone Number: [phone]  Hello Dr. Sabra Heck,   My compounded testosterone cream needs a refill. In the past I'd call [facility] directly but the refill was denied by your office. I think I remember that I should contact you/your staff directly to get a new prescription filled. Is there anything more I need to do?   Thank you, and I hope you are well.  Megan Cain

## 2019-10-03 ENCOUNTER — Encounter: Payer: Self-pay | Admitting: Obstetrics & Gynecology

## 2019-10-03 ENCOUNTER — Other Ambulatory Visit: Payer: Self-pay

## 2019-10-03 ENCOUNTER — Other Ambulatory Visit (HOSPITAL_COMMUNITY)
Admission: RE | Admit: 2019-10-03 | Discharge: 2019-10-03 | Disposition: A | Discharge status: Home / Self Care | Payer: 59 | Source: Ambulatory Visit | Attending: Obstetrics & Gynecology | Admitting: Obstetrics & Gynecology

## 2019-10-03 ENCOUNTER — Ambulatory Visit: Payer: 59 | Admitting: Obstetrics & Gynecology

## 2019-10-03 VITALS — BP 128/76 | HR 72 | Temp 97.3°F | Ht 63.0 in | Wt 136.0 lb

## 2019-10-03 DIAGNOSIS — Z01419 Encounter for gynecological examination (general) (routine) without abnormal findings: Secondary | ICD-10-CM

## 2019-10-03 DIAGNOSIS — E2839 Other primary ovarian failure: Secondary | ICD-10-CM | POA: Diagnosis not present

## 2019-10-03 DIAGNOSIS — Z124 Encounter for screening for malignant neoplasm of cervix: Secondary | ICD-10-CM | POA: Insufficient documentation

## 2019-10-03 DIAGNOSIS — Z0000 Encounter for general adult medical examination without abnormal findings: Secondary | ICD-10-CM | POA: Diagnosis not present

## 2019-10-03 MED ORDER — ESTRADIOL 0.075 MG/24HR TD PTTW: MEDICATED_PATCH | TRANSDERMAL | 4 refills | Status: DC

## 2019-10-03 MED ORDER — MEDROXYPROGESTERONE ACETATE 2.5 MG PO TABS: 2.5000 mg | ORAL_TABLET | Freq: Every day | ORAL | 4 refills | Status: DC

## 2019-10-03 MED ORDER — NONFORMULARY OR COMPOUNDED ITEM: 1 refills | Status: DC

## 2019-10-03 MED ORDER — FLUCONAZOLE 150 MG PO TABS: ORAL_TABLET | ORAL | 1 refills | Status: DC

## 2019-10-03 NOTE — Patient Instructions (Signed)
Plan to have a bone density in January with your mammogram.  I placed the order today as a future order.

## 2019-10-03 NOTE — Progress Notes (Signed)
59 y.o. G3P2 Married White or Caucasian female here for annual exam.  Doing well.  Has gotten first Covid vaccination with the [facility] clinic.    Denies vaginal bleeding.  PCP:  Dr. Darron Doom.  Last in person visit was about a year ago.  Had 6 month follow up via virtual visit.  Has appt with Dr. Chalmers Cater tomorrow.    Patient's last menstrual period was 02/21/2015 (exact date).          Sexually active: Yes.    The current method of family planning is post menopausal status.    Exercising: Yes.    6 times a week Smoker:  no  Health Maintenance: Pap:   07/29/18 Neg  02/11/16 Neg. 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 F/u 10 years  BMD:   never TDaP:  2017 Pneumonia vaccine(s):  n/a Shingrix:   completed Hep C testing: 2018 Neg Screening Labs: endocrinologist   reports that she has never smoked. She has never used smokeless tobacco. She reports current alcohol use of about 2.0 standard drinks of alcohol per week. She reports that she does not use drugs.  Past Medical History:  Diagnosis Date  . Abnormal Pap smear of cervix    CIS of cervix  . Depression   . 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 Glaucoma        . Hyperlipidemia   . Hypothyroid   . 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 Shingles 2005    Past Surgical History:  Procedure Laterality Date  . cesarian section  1996  . GLAUCOMA REPAIR Bilateral 2011   Dr Ellie Lunch  . KNEE ARTHROSCOPY Right 4/90      . laser conization  6/90   CIS of cervix  . SHOULDER ARTHROSCOPY W/ CAPSULAR REPAIR  11/12   lt. shoulder  . SQUAMOUS CELL CARCINOMA EXCISION  09/2015   Right ear  . thumb surgery Left 1975    Current Outpatient Medications  Medication Sig Dispense Refill  . B Complex Vitamins (B COMPLEX PO) Take by mouth.    . cholecalciferol (VITAMIN D)  1000 UNITS tablet Take 1,000 Units by mouth daily.    . Coenzyme Q10 (CO Q 10 PO) Take by mouth.    . Continuous Blood Gluc Sensor (FREESTYLE LIBRE SENSOR SYSTEM) MISC USE AS DIRECTED EVERY 10 DAYS  12  . Cyanocobalamin (VITAMIN B-12 PO) Take by mouth.    . fish oil-omega-3 fatty acids 1000 MG capsule Take 2 g by mouth daily.    . fluticasone (FLONASE) 50 MCG/ACT nasal spray Place into both nostrils daily.    Marland Kitchen JARDIANCE 10 MG TABS tablet Take 10 mg by mouth daily.    Marland Kitchen lamoTRIgine (LAMICTAL) 25 MG tablet Take 2 tablets by mouth daily.    Marland Kitchen loratadine (CLARITIN) 10 MG tablet Take 10 mg by mouth daily.    Marland Kitchen losartan (COZAAR) 50 MG tablet Take 1 tablet by mouth daily.    . medroxyPROGESTERone (PROVERA) 2.5 MG tablet Take 1 tablet (2.5 mg total) by mouth daily. 90 tablet 4  . metFORMIN (GLUCOPHAGE) 1000 MG tablet Take 1,000 mg by mouth 2 (two) times daily with a meal.    . Multiple Vitamins-Minerals (MULTIVITAMIN WITH MINERALS) tablet Take  1 tablet by mouth daily.    . NONFORMULARY OR COMPOUNDED ITEM 2% Topical testosterone propionate in petrolatum.  Apply 1/4 tsp to skin two to three times weekly.  Disp:  30gm 1 each 1  . Probiotic Product (PROBIOTIC DAILY PO) Take by mouth daily.    Marland Kitchen thyroid (ARMOUR) 60 MG tablet Take 60 mg by mouth daily before breakfast.    . thyroid (NP THYROID) 15 MG tablet Take 15 mg by mouth daily.    Marland Kitchen VIVELLE-DOT 0.075 MG/24HR PLACE 1 PATCH ONTO THE SKIN 2 (TWO) TIMES A WEEK. 24 patch 0  . aspirin EC 81 MG tablet Take 81 mg by mouth daily.    Marland Kitchen MAGNESIUM PO Take by mouth.    . Turmeric 450 MG CAPS Take by mouth 2 (two) times daily.     No current facility-administered medications for this visit.    Family History  Problem Relation Age of Onset  . Heart disease Father   . Hyperlipidemia Father   . Stroke Father   . Diabetes Son        type 1 in both sons  . Hypertension Mother     Review of Systems  All other systems reviewed and are negative.   Exam:    BP 128/76 (BP Location: Right Arm, Patient Position: Sitting, Cuff Size: Normal)   Pulse 72   Temp (!) 97.3 F (36.3 C) (Temporal)   Ht 5\' 3"  (1.6 m)   Wt 136 lb (61.7 kg)   LMP 02/21/2015 (Exact Date)   BMI 24.09 kg/m     Height: 5\' 3"  (160 cm)  Ht Readings from Last 3 Encounters:  10/03/19 5\' 3"  (1.6 m)  07/29/18 5\' 3"  (1.6 m)  05/11/17 5\' 3"  (1.6 m)    General appearance: alert, cooperative and appears stated age Head: Normocephalic, without obvious abnormality, atraumatic Neck: no adenopathy, supple, symmetrical, trachea midline and thyroid normal to inspection and palpation Lungs: clear to auscultation bilaterally Breasts: normal appearance, no masses or tenderness Heart: regular rate and rhythm Abdomen: soft, non-tender; bowel sounds normal; no masses,  no organomegaly Extremities: extremities normal, atraumatic, no cyanosis or edema Skin: Skin color, texture, turgor normal. No rashes or lesions Lymph nodes: Cervical, supraclavicular, and axillary nodes normal. No abnormal inguinal nodes palpated Neurologic: Grossly normal   Pelvic: External genitalia:  no lesions              Urethra:  normal appearing urethra with no masses, tenderness or lesions              Bartholins and Skenes: normal                 Vagina: normal appearing vagina with normal color and discharge, no lesions              Cervix: no lesions              Pap taken: Yes.   Bimanual Exam:  Uterus:  normal size, contour, position, consistency, mobility, non-tender              Adnexa: normal adnexa and no mass, fullness, tenderness               Rectovaginal: Confirms               Anus:  normal sphincter tone, no lesions  Chaperone, Terence Lux, CMA, was present for exam.  A:  Well Woman with normal exam PMP, on HRT H/o CIS of cervix, s/p  laser conization 6/90.  Normal pap smear since. H/o depression Diabetes Hypothyroidism   P:   Mammogram guidelines reviewed.  Doing 3D breast  dnesity pap smear with HR HPV obtained today Yeast testing obtained on pap smear RF for Vivelle 0.075mg  patches #24/4RF and Provera 2.5mg  po day  #90/4RF Total testosterone lab obtained today Rx for diflucan 150mg  po x 1, repeat in 72 hours.  #2/1RF Return annually or prn

## 2019-10-05 LAB — CYTOLOGY - PAP
Comment: NEGATIVE
Diagnosis: NEGATIVE
High risk HPV: NEGATIVE

## 2019-10-12 LAB — TESTOSTERONE FREE MS/DIALYSIS
Free Testosterone, Serum: 0.8 pg/mL — ABNORMAL LOW
Testosterone, Serum (Total): 16 ng/dL
Testosterone-% Free: 0.5 %

## 2019-10-12 LAB — VITAMIN D 25 HYDROXY (VIT D DEFICIENCY, FRACTURES): Vit D, 25-Hydroxy: 48.2 ng/mL (ref 30.0–100.0)

## 2019-12-07 ENCOUNTER — Ambulatory Visit: Payer: 59 | Admitting: Obstetrics & Gynecology

## 2020-07-16 ENCOUNTER — Other Ambulatory Visit: Payer: 59

## 2020-07-16 DIAGNOSIS — Z20822 Contact with and (suspected) exposure to covid-19: Secondary | ICD-10-CM

## 2020-07-17 LAB — SARS-COV-2, NAA 2 DAY TAT

## 2020-07-17 LAB — NOVEL CORONAVIRUS, NAA: SARS-CoV-2, NAA: NOT DETECTED

## 2020-08-21 ENCOUNTER — Other Ambulatory Visit: Payer: Self-pay | Admitting: Obstetrics and Gynecology

## 2020-08-21 DIAGNOSIS — Z1231 Encounter for screening mammogram for malignant neoplasm of breast: Secondary | ICD-10-CM

## 2020-10-09 ENCOUNTER — Ambulatory Visit
Admission: RE | Admit: 2020-10-09 | Discharge: 2020-10-09 | Disposition: A | Discharge status: Home / Self Care | Payer: 59 | Source: Ambulatory Visit | Attending: Obstetrics and Gynecology | Admitting: Obstetrics and Gynecology

## 2020-10-09 ENCOUNTER — Other Ambulatory Visit: Payer: Self-pay

## 2020-10-09 DIAGNOSIS — Z1231 Encounter for screening mammogram for malignant neoplasm of breast: Secondary | ICD-10-CM

## 2020-10-22 NOTE — Progress Notes (Signed)
60 y.o. G3P2 Married Caucasian female here for annual exam.    Having yeast infections off and on lifelong.  She frequently uses Diflucan.  She does not have burning and itching.  She does have discharge.   She is having difficulty remembering her estrogen patch, provera and testosterone.  Not feeling off balance.  Wondering if it is time to stop her HRT.   No bleeding or spotting.   Works on her diet and nutrition.  A1C 6.0.  Received Covid booster and she will do her next booster also.  Had shingles and did her Shingrix vaccine.   PCP:   Dr. Darron Doom.  Endocrinology.  Patient's last menstrual period was 02/21/2015 (exact date).           Sexually active: Yes.    The current method of family planning is post menopausal status.    Exercising: Yes.    Gym/ health club routine includes cardio and yoga. Smoker:  no  Health Maintenance: Pap: 10-03-19 Neg:Neg HR HPV,  07/29/18 Neg, 02-11-16 Neg:Neg HR HPV History of abnormal Pap:  Yes, 11/1988 CIS of cx, s/p Laser conization MMG: 10-09-20 3D/Neg/BiRads1 Colonoscopy:  12/23/11 Normal. F/u 10 years BMD: n/a  Result  n/a TDaP:  2017 Gardasil:   no HIV: --- Hep C: 05-11-17 Neg Screening Labs:  PCP   reports that she has never smoked. She has never used smokeless tobacco. She reports current alcohol use of about 2.0 standard drinks of alcohol per week. She reports that she does not use drugs.  Past Medical History:  Diagnosis Date  . Abnormal Pap smear of cervix    CIS of cervix  . Depression   . 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 Glaucoma        . Hyperlipidemia   . Hypothyroid   . 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 Shingles 2005    Past Surgical History:  Procedure Laterality Date  . cesarian section  1996  . GLAUCOMA REPAIR Bilateral 2011   Dr Ellie Lunch  . KNEE ARTHROSCOPY Right 4/90      . laser conization  6/90   CIS of cervix  . SHOULDER ARTHROSCOPY W/ CAPSULAR REPAIR   11/12   lt. shoulder  . SQUAMOUS CELL CARCINOMA EXCISION  09/2015   Right ear  . thumb surgery Left 1975    Current Outpatient Medications  Medication Sig Dispense Refill  . B Complex Vitamins (B COMPLEX PO) Take by mouth.    . cholecalciferol (VITAMIN D) 1000 UNITS tablet Take 1,000 Units by mouth daily.    . Coenzyme Q10 (CO Q 10 PO) Take by mouth.    . Continuous Blood Gluc Sensor (FREESTYLE LIBRE SENSOR SYSTEM) MISC USE AS DIRECTED EVERY 10 DAYS  12  . Cyanocobalamin (VITAMIN B-12 PO) Take by mouth.    . fish oil-omega-3 fatty acids 1000 MG capsule Take 2 g by mouth daily.    . fluticasone (FLONASE) 50 MCG/ACT nasal spray Place into both nostrils daily.    Marland Kitchen JARDIANCE 10 MG TABS tablet Take 10 mg by mouth daily.    Marland Kitchen lamoTRIgine (LAMICTAL) 25 MG tablet Take 2 tablets by mouth daily.    Marland Kitchen loratadine (CLARITIN) 10 MG tablet Take 10 mg by mouth daily.    Marland Kitchen losartan (COZAAR) 50 MG tablet Take 1 tablet by mouth daily.    Marland Kitchen MAGNESIUM PO Take by mouth.    . metFORMIN (GLUCOPHAGE) 1000 MG  tablet Take 1,000 mg by mouth 2 (two) times daily with a meal.    . Multiple Vitamins-Minerals (MULTIVITAMIN WITH MINERALS) tablet Take 1 tablet by mouth daily.    . NONFORMULARY OR COMPOUNDED ITEM 2% Topical testosterone propionate in petrolatum.  Apply 1/4 tsp to skin two to three times weekly.  Disp:  30gm 1 each 1  . Probiotic Product (PROBIOTIC DAILY PO) Take by mouth daily.    Marland Kitchen thyroid (ARMOUR) 15 MG tablet Take 15 mg by mouth daily.    Marland Kitchen thyroid (ARMOUR) 60 MG tablet Take 60 mg by mouth daily before breakfast.    . Turmeric 450 MG CAPS Take by mouth 2 (two) times daily.    . fluconazole (DIFLUCAN) 150 MG tablet 1 tab po x 1, repeat I 72 hours if needed (Patient not taking: Reported on 10/23/2020) 2 tablet 1   No current facility-administered medications for this visit.    Family History  Problem Relation Age of Onset  . Heart disease Father   . Hyperlipidemia Father   . Stroke Father   .  Diabetes Son        type 1 in both sons  . Hypertension Mother   . Breast cancer Maternal Aunt     Review of Systems  See HPI.   Exam:   BP 122/80 (BP Location: Right Arm, Patient Position: Sitting, Cuff Size: Normal)   Ht 5\' 3"  (1.6 m)   Wt 132 lb (59.9 kg)   LMP 02/21/2015 (Exact Date)   BMI 23.38 kg/m     General appearance: alert, cooperative and appears stated age Head: normocephalic, without obvious abnormality, atraumatic Neck: no adenopathy, supple, symmetrical, trachea midline and thyroid normal to inspection and palpation Lungs: clear to auscultation bilaterally Breasts: normal appearance, no masses or tenderness, No nipple retraction or dimpling, No nipple discharge or bleeding, No axillary adenopathy Heart: regular rate and rhythm Abdomen: soft, non-tender; no masses, no organomegaly Extremities: extremities normal, atraumatic, no cyanosis or edema Skin: skin color, texture, turgor normal. No rashes or lesions Lymph nodes: cervical, supraclavicular, and axillary nodes normal. Neurologic: grossly normal  Pelvic: External genitalia:  no lesions              No abnormal inguinal nodes palpated.              Urethra:  normal appearing urethra with no masses, tenderness or lesions              Bartholins and Skenes: normal                 Vagina: normal appearing vagina with normal color and discharge, no lesions              Cervix: no lesions              Pap taken: No. Bimanual Exam:  Uterus:  normal size, contour, position, consistency, mobility, non-tender              Adnexa: no mass, fullness, tenderness              Rectal exam: Yes.  .  Confirms.              Anus:  normal sphincter tone, no lesions  Chaperone was present for exam.  Assessment:   Well woman visit with normal exam. HRT and testosterone treatment.  Hx cervical conization for CIS, 1990.  Normal follow up.  Plan: Mammogram screening discussed. Self breast awareness reviewed. Pap and HR  HPV  in 2024. Guidelines for Calcium, Vitamin D, regular exercise program including cardiovascular and weight bearing exercise. Will stop HRT and testosterone after discussing WHI study and patient goals.  Nuswab for vaginitis testing.  Consider Estroven and melatonin.   BMD ordered.  Follow up annually and prn.

## 2020-10-23 ENCOUNTER — Ambulatory Visit: Payer: 59 | Admitting: Obstetrics and Gynecology

## 2020-10-23 ENCOUNTER — Encounter: Payer: Self-pay | Admitting: Obstetrics and Gynecology

## 2020-10-23 ENCOUNTER — Other Ambulatory Visit: Payer: Self-pay

## 2020-10-23 VITALS — BP 122/80 | Ht 63.0 in | Wt 132.0 lb

## 2020-10-23 DIAGNOSIS — Z780 Asymptomatic menopausal state: Secondary | ICD-10-CM

## 2020-10-23 DIAGNOSIS — Z01419 Encounter for gynecological examination (general) (routine) without abnormal findings: Secondary | ICD-10-CM | POA: Diagnosis not present

## 2020-10-23 DIAGNOSIS — N898 Other specified noninflammatory disorders of vagina: Secondary | ICD-10-CM | POA: Diagnosis not present

## 2020-10-23 NOTE — Patient Instructions (Signed)

## 2020-10-24 LAB — CERVICOVAGINAL ANCILLARY ONLY
Bacterial Vaginitis (gardnerella): NEGATIVE
Candida Glabrata: NEGATIVE
Candida Vaginitis: NEGATIVE
Comment: NEGATIVE
Comment: NEGATIVE
Comment: NEGATIVE
Comment: NEGATIVE
Trichomonas: NEGATIVE

## 2020-11-18 ENCOUNTER — Other Ambulatory Visit: Payer: Self-pay | Admitting: Obstetrics & Gynecology

## 2021-01-03 ENCOUNTER — Ambulatory Visit: Payer: 59 | Admitting: Obstetrics & Gynecology

## 2021-02-05 ENCOUNTER — Other Ambulatory Visit: Payer: Self-pay | Admitting: Obstetrics & Gynecology

## 2021-02-06 NOTE — Telephone Encounter (Signed)
LMOVM that Rx refill request was received however we did not fill it. Advised that she had seen Dr. Quincy Simmonds in April and it looked like they discussed changing it. Advised that if she needed a refill to have her pharmacy send the request to Dr. Elza Rafter office.

## 2021-04-17 ENCOUNTER — Other Ambulatory Visit: Payer: Self-pay | Admitting: Endocrinology

## 2021-04-17 DIAGNOSIS — E1165 Type 2 diabetes mellitus with hyperglycemia: Secondary | ICD-10-CM

## 2021-04-30 ENCOUNTER — Other Ambulatory Visit: Payer: Self-pay

## 2021-04-30 ENCOUNTER — Ambulatory Visit
Admission: RE | Admit: 2021-04-30 | Discharge: 2021-04-30 | Disposition: A | Discharge status: Home / Self Care | Payer: 59 | Source: Ambulatory Visit | Attending: Obstetrics and Gynecology | Admitting: Obstetrics and Gynecology

## 2021-04-30 DIAGNOSIS — Z780 Asymptomatic menopausal state: Secondary | ICD-10-CM

## 2021-05-20 ENCOUNTER — Ambulatory Visit
Admission: RE | Admit: 2021-05-20 | Discharge: 2021-05-20 | Disposition: A | Discharge status: Home / Self Care | Payer: No Typology Code available for payment source | Source: Ambulatory Visit | Attending: Endocrinology | Admitting: Endocrinology

## 2021-05-20 DIAGNOSIS — E1165 Type 2 diabetes mellitus with hyperglycemia: Secondary | ICD-10-CM

## 2021-10-17 ENCOUNTER — Other Ambulatory Visit: Payer: Self-pay | Admitting: Obstetrics and Gynecology

## 2021-10-17 DIAGNOSIS — Z1231 Encounter for screening mammogram for malignant neoplasm of breast: Secondary | ICD-10-CM

## 2021-10-20 ENCOUNTER — Ambulatory Visit
Admission: RE | Admit: 2021-10-20 | Discharge: 2021-10-20 | Disposition: A | Discharge status: Home / Self Care | Payer: 59 | Source: Ambulatory Visit | Attending: Obstetrics and Gynecology | Admitting: Obstetrics and Gynecology

## 2021-10-20 DIAGNOSIS — Z1231 Encounter for screening mammogram for malignant neoplasm of breast: Secondary | ICD-10-CM

## 2021-10-27 ENCOUNTER — Ambulatory Visit (INDEPENDENT_AMBULATORY_CARE_PROVIDER_SITE_OTHER): Payer: 59 | Admitting: Obstetrics and Gynecology

## 2021-10-27 ENCOUNTER — Encounter: Payer: Self-pay | Admitting: Obstetrics and Gynecology

## 2021-10-27 VITALS — BP 118/70 | HR 70 | Ht 63.0 in | Wt 128.0 lb

## 2021-10-27 DIAGNOSIS — Z01419 Encounter for gynecological examination (general) (routine) without abnormal findings: Secondary | ICD-10-CM

## 2021-10-27 NOTE — Progress Notes (Signed)
61 y.o. G3P2 Married Caucasian female here for annual exam.   ? ?Stopped HRT after her visit last year, and is doing well.  ?Taking Estroven. ?Some night sweats once a week.  ?Symptoms are manageable.  ? ?A1C is 6.5. ? ?PCP:  Horald Pollen, MD ?Endocrinology:  Dr. Chalmers Cater. ? ?Patient's last menstrual period was 02/21/2015 (exact date).     ?  ?    ?Sexually active: Yes.    ?The current method of family planning is post menopausal status.    ?Exercising: Yes.     Yoga, weights, cardio ?Smoker:  no ? ?Health Maintenance: ?Pap:  10-03-19 Neg:Neg HR HPV,  07/29/18 Neg, 02-11-16 Neg:Neg HR HPV ?History of abnormal Pap:  Yes, 11/1988 CIS of cx, s/p Laser conization ?MMG:  10-20-21 Neg/BiRads1 ?Colonoscopy:  12-23-11 Normal;10 years.    ?BMD:  04-30-21  Result :Osteopenia.  FRAX:  15.5% risk of major fracture and 0.8% risk of hip fracture in following 10 years.  ?TDaP:  2017 ?Gardasil:   n/a ?HIV: no ?Hep C: 05-11-17 Neg ?Screening Labs:  PCP ?Shingrix:  completed.   ? ? reports that she has never smoked. She has never used smokeless tobacco. She reports current alcohol use of about 1.0 standard drink per week. She reports that she does not use drugs. ? ?Past Medical History:  ?Diagnosis Date  ? Abnormal Pap smear of cervix   ? CIS of cervix  ? Depression   ? Frozen shoulder 2012, 2014  ? left and right shoulder  ? Glaucoma    ?    ? Hyperlipidemia   ? Hypothyroid   ? 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 Right ear  ? Shingles 2005  ? ? ?Past Surgical History:  ?Procedure Laterality Date  ? cesarian section  1996  ? GLAUCOMA REPAIR Bilateral 2011  ? Dr Ellie Lunch  ? KNEE ARTHROSCOPY Right 4/90  ?    ? laser conization  6/90  ? CIS of cervix  ? SHOULDER ARTHROSCOPY W/ CAPSULAR REPAIR  11/12  ? lt. shoulder  ? SQUAMOUS CELL CARCINOMA EXCISION  09/2015  ? Right ear  ? thumb surgery Left 1975  ? ? ?Current Outpatient Medications  ?Medication Sig Dispense Refill  ? B Complex Vitamins (B COMPLEX PO) Take by mouth.    ?  cholecalciferol (VITAMIN D) 1000 UNITS tablet Take 1,000 Units by mouth daily.    ? Coenzyme Q10 (CO Q 10 PO) Take by mouth.    ? Continuous Blood Gluc Sensor (DEXCOM G4 SENSOR) MISC by Does not apply route.    ? Cyanocobalamin (VITAMIN B-12 PO) Take by mouth.    ? fish oil-omega-3 fatty acids 1000 MG capsule Take 2 g by mouth daily.    ? fluticasone (FLONASE) 50 MCG/ACT nasal spray Place into both nostrils daily.    ? JARDIANCE 25 MG TABS tablet Take by mouth.    ? lamoTRIgine (LAMICTAL) 25 MG tablet Take 2 tablets by mouth daily.    ? loratadine (CLARITIN) 10 MG tablet Take 10 mg by mouth daily.    ? losartan (COZAAR) 25 MG tablet Take 1 tablet by mouth daily.    ? MAGNESIUM PO Take by mouth.    ? metFORMIN (GLUCOPHAGE-XR) 500 MG 24 hr tablet TAKE ONE TABLET BY MOUTH EVERY MORNING AND 2 TABLETS IN THE EVENING    ? Multiple Vitamins-Minerals (MULTIVITAMIN WITH MINERALS) tablet Take 1 tablet by mouth daily.    ? Probiotic Product (PROBIOTIC DAILY PO) Take by mouth  daily.    ? thyroid (ARMOUR) 15 MG tablet 1 tablet on an empty stomach    ? thyroid (ARMOUR) 60 MG tablet Take 60 mg by mouth daily before breakfast.    ? Turmeric 450 MG CAPS Take by mouth 2 (two) times daily.    ? ?No current facility-administered medications for this visit.  ? ? ?Family History  ?Problem Relation Age of Onset  ? Heart disease Father   ? Hyperlipidemia Father   ? Stroke Father   ? Diabetes Son   ?     type 1 in both sons  ? Hypertension Mother   ? Breast cancer Maternal Aunt   ? ? ?Review of Systems  ?All other systems reviewed and are negative. ? ?Exam:   ?BP 118/70   Pulse 70   Ht '5\' 3"'$  (1.6 m)   Wt 128 lb (58.1 kg)   LMP 02/21/2015 (Exact Date)   SpO2 98%   BMI 22.67 kg/m?     ?General appearance: alert, cooperative and appears stated age ?Head: normocephalic, without obvious abnormality, atraumatic ?Neck: no adenopathy, supple, symmetrical, trachea midline and thyroid normal to inspection and palpation ?Lungs: clear to  auscultation bilaterally ?Breasts: normal appearance, no masses or tenderness, No nipple retraction or dimpling, No nipple discharge or bleeding, No axillary adenopathy ?Heart: regular rate and rhythm ?Abdomen: soft, non-tender; no masses, no organomegaly ?Extremities: extremities normal, atraumatic, no cyanosis or edema ?Skin: skin color, texture, turgor normal. No rashes or lesions ?Lymph nodes: cervical, supraclavicular, and axillary nodes normal. ?Neurologic: grossly normal ? ?Pelvic: External genitalia:  no lesions ?             No abnormal inguinal nodes palpated. ?             Urethra:  normal appearing urethra with no masses, tenderness or lesions ?             Bartholins and Skenes: normal    ?             Vagina: normal appearing vagina with normal color and discharge, no lesions ?             Cervix: no lesions ?             Pap taken: no ?Bimanual Exam:  Uterus:  normal size, contour, position, consistency, mobility, non-tender ?             Adnexa: no mass, fullness, tenderness ?             Rectal exam: yes.  Confirms. ?             Anus:  normal sphincter tone, no lesions ? ?Chaperone was present for exam:  Eustace Pen, CMA ? ?Assessment:   ?Well woman visit with gynecologic exam. ?Off HRT.  ?Hx cervical conization for CIS, 1990.  Normal follow up. ?Osteopenia.  ? ?Plan: ?Mammogram screening discussed. ?Self breast awareness reviewed. ?Pap and HR HPV 2024.  ?Guidelines for Calcium, Vitamin D, regular exercise program including cardiovascular and weight bearing exercise. ?[facility]  She will schedule. ?BMD in 2024.  ?Follow up annually and prn.  ? ?After visit summary provided.  ? ? ? ?

## 2021-10-27 NOTE — Patient Instructions (Signed)

## 2022-01-08 ENCOUNTER — Encounter: Payer: Self-pay | Admitting: Gastroenterology

## 2022-01-22 ENCOUNTER — Encounter: Payer: Self-pay | Admitting: Gastroenterology

## 2022-02-24 ENCOUNTER — Encounter: Payer: Self-pay | Admitting: Gastroenterology

## 2022-02-24 ENCOUNTER — Ambulatory Visit: Payer: 59 | Admitting: Gastroenterology

## 2022-02-24 VITALS — BP 114/70 | HR 74 | Ht 63.0 in | Wt 127.0 lb

## 2022-02-24 DIAGNOSIS — Z1211 Encounter for screening for malignant neoplasm of colon: Secondary | ICD-10-CM | POA: Diagnosis not present

## 2022-02-24 DIAGNOSIS — Z1212 Encounter for screening for malignant neoplasm of rectum: Secondary | ICD-10-CM | POA: Diagnosis not present

## 2022-02-24 MED ORDER — NA SULFATE-K SULFATE-MG SULF 17.5-3.13-1.6 GM/177ML PO SOLN: 1.0000 | Freq: Once | ORAL | 0 refills | Status: AC

## 2022-02-24 NOTE — Patient Instructions (Addendum)
_______________________________________________________  If you are age 61 or older, your body mass index should be between 23-30. Your Body mass index is 22.5 kg/m. If this is out of the aforementioned range listed, please consider follow up with your Primary Care Provider.  If you are age 17 or younger, your body mass index should be between 19-25. Your Body mass index is 22.5 kg/m. If this is out of the aformentioned range listed, please consider follow up with your Primary Care Provider.   The [facility] providers would like to encourage you to use [facility] to communicate with providers for non-urgent requests or questions.  Due to long hold times on the telephone, sending your provider a message by [facility] may be a faster and more efficient way to get a response.  Please allow 48 business hours for a response.  Please remember that this is for non-urgent requests.    You have been scheduled for a colonoscopy. Please follow written instructions given to you at your visit today.  Please pick up your prep supplies at the pharmacy within the next 1-3 days. If you use inhalers (even only as needed), please bring them with you on the day of your procedure.   It was a pleasure to see you today!  Thank you for trusting me with your gastrointestinal care!    Scott E.Candis Schatz, MD

## 2022-02-24 NOTE — Progress Notes (Signed)
HPI : Megan Cain is a very pleasant 61 year old female with a history of insulin-dependent diabetes who is referred to [place] by Dr. Horald Pollen for screening colonoscopy.  The patient underwent her initial average risk screening colonoscopy in June 2013.  Colonoscopy was unremarkable with no polyps.  The patient has no family history of colon cancer.  She denies any chronic GI symptoms to include abdominal pain, constipation, diarrhea or blood in her stool.  She denies any chronic upper GI symptoms such as frequent nausea/vomiting, heartburn/acid regurgitation, dysphagia or dyspepsia.  She has no known cardiopulmonary comorbidities.  She has been having some frequent cough symptoms which she thinks may be allergy related. She was recently started on insulin for her diabetes.  Her insulin regimen is still being adjusted by her endocrinologist who she sees again August 22.  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 shoulder   SQUAMOUS CELL CARCINOMA EXCISION  09/2015   Right ear   thumb surgery Left 1975   Family History  Problem Relation Age of Onset   Hypertension Mother    Breast cancer Mother    Heart disease Father    Hyperlipidemia Father    Stroke Father    Leukemia Brother    Diabetes Son        type 1 in both sons   Diabetes Son    Breast cancer Maternal Aunt    Colon cancer Neg Hx    Stomach cancer Neg Hx     Esophageal cancer Neg Hx    Social History   Tobacco Use   Smoking status: Never   Smokeless tobacco: Never  Vaping Use   Vaping Use: Never used  Substance Use Topics   Alcohol use: Yes    Alcohol/week: 1.0 standard drink of alcohol    Types: 1 Cans of beer per week   Drug use: No   Current Outpatient Medications  Medication Sig Dispense Refill   B Complex Vitamins (B COMPLEX PO) Take by mouth.     cholecalciferol (VITAMIN D) 1000 UNITS tablet Take 1,000 Units by mouth daily.     Coenzyme Q10 (CO Q 10 PO) Take by mouth.     Continuous Blood Gluc Sensor (DEXCOM G4 SENSOR) MISC by Does not apply route.     Cyanocobalamin (VITAMIN B-12 PO) Take by mouth.     fish oil-omega-3 fatty acids 1000 MG capsule Take 2 g by mouth daily.     fluticasone (FLONASE) 50 MCG/ACT nasal spray Place into both nostrils daily.     JARDIANCE 25 MG TABS tablet Take by mouth.     lamoTRIgine (LAMICTAL) 25 MG tablet Take 2 tablets by mouth daily.     LANTUS SOLOSTAR 100 UNIT/ML  Solostar Pen Inject into the skin.     loratadine (CLARITIN) 10 MG tablet Take 10 mg by mouth daily.     losartan (COZAAR) 25 MG tablet Take 1 tablet by mouth daily.     MAGNESIUM PO Take by mouth.     metFORMIN (GLUCOPHAGE-XR) 500 MG 24 hr tablet TAKE ONE TABLET BY MOUTH EVERY MORNING AND 2 TABLETS IN THE EVENING     Multiple Vitamins-Minerals (MULTIVITAMIN WITH MINERALS) tablet Take 1 tablet by mouth daily.     Probiotic Product (PROBIOTIC DAILY PO) Take by mouth daily.     rosuvastatin (CRESTOR) 5 MG tablet Take by mouth.     thyroid (ARMOUR) 15 MG tablet 1 tablet on an empty stomach     thyroid (ARMOUR) 60 MG tablet Take 60 mg by mouth daily before breakfast.     Turmeric 450 MG CAPS Take by mouth 2 (two) times daily.     No current facility-administered medications for this visit.   Allergies  Allergen Reactions   Lisinopril Other (See Comments) and Cough    cough     Review of Systems: All systems reviewed and  negative except where noted in HPI.    No results found.  Physical Exam: BP 114/70   Pulse 74   Ht '5\' 3"'$  (1.6 m)   Wt 127 lb (57.6 kg)   LMP 02/21/2015 (Exact Date)   BMI 22.50 kg/m  Constitutional: Pleasant,well-developed, Caucasian female in no acute distress. HEENT: Normocephalic and atraumatic. Conjunctivae are normal. No scleral icterus. Cardiovascular: Normal rate, regular rhythm.  Pulmonary/chest: Effort normal and breath sounds normal. No wheezing, rales or rhonchi. Abdominal: Soft, nondistended, nontender. Bowel sounds active throughout. There are no masses palpable. No hepatomegaly. Extremities: no edema Neurological: Alert and oriented to person place and time. Skin: Skin is warm and dry. No rashes noted. Psychiatric: Normal mood and affect. Behavior is normal.  CBC    Component Value Date/Time   WBC 7.1 05/11/2017 1628   RBC 4.31 05/11/2017 1628   HGB 13.1 05/11/2017 1628   HGB 12.6 11/26/2014 1414   HCT 39.2 05/11/2017 1628   PLT 277 05/11/2017 1628   MCV 91 05/11/2017 1628   MCH 30.4 05/11/2017 1628   MCHC 33.4 05/11/2017 1628   RDW 13.0 05/11/2017 1628    CMP  No results found for: "NA", "K", "CL", "CO2", "GLUCOSE", "BUN", "CREATININE", "CALCIUM", "PROT", "ALBUMIN", "AST", "ALT", "ALKPHOS", "BILITOT", "GFRNONAA", "GFRAA"   ASSESSMENT AND PLAN: 61 year old female due for average risk colon cancer screening after a negative colonoscopy 10 years ago.  She has no GI symptoms and no family history of colon cancer.  We will schedule her for a routine screening colonoscopy today.  We will provide her with our standard recommendations for altering her insulin and oral hypoglycemic medications in the setting of a colonoscopy.  If her endocrinologist feels that this needs to be adjusted, we would defer to her recommendations.  Colon cancer screening - Screening colonoscopy  The details, risks (including bleeding, perforation, infection, missed lesions, medication  reactions and possible hospitalization or surgery if complications occur), benefits, and alternatives to colonoscopy with possible biopsy and possible polypectomy were discussed with the patient and she consents to proceed.    Jaselle Pryer E. Candis Schatz, [facility]  Darron Doom Maebelle Munroe, MD

## 2022-03-08 ENCOUNTER — Other Ambulatory Visit: Payer: Self-pay

## 2022-03-08 ENCOUNTER — Encounter (HOSPITAL_COMMUNITY): Payer: Self-pay

## 2022-03-08 ENCOUNTER — Emergency Department (HOSPITAL_COMMUNITY)
Admission: EM | Admit: 2022-03-08 | Discharge: 2022-03-08 | Disposition: A | Discharge status: Home / Self Care | Payer: 59 | Attending: Emergency Medicine | Admitting: Emergency Medicine

## 2022-03-08 DIAGNOSIS — R112 Nausea with vomiting, unspecified: Secondary | ICD-10-CM | POA: Insufficient documentation

## 2022-03-08 DIAGNOSIS — E109 Type 1 diabetes mellitus without complications: Secondary | ICD-10-CM | POA: Insufficient documentation

## 2022-03-08 DIAGNOSIS — Z85828 Personal history of other malignant neoplasm of skin: Secondary | ICD-10-CM | POA: Diagnosis not present

## 2022-03-08 DIAGNOSIS — R1013 Epigastric pain: Secondary | ICD-10-CM | POA: Insufficient documentation

## 2022-03-08 DIAGNOSIS — Z794 Long term (current) use of insulin: Secondary | ICD-10-CM | POA: Diagnosis not present

## 2022-03-08 DIAGNOSIS — E039 Hypothyroidism, unspecified: Secondary | ICD-10-CM | POA: Diagnosis not present

## 2022-03-08 DIAGNOSIS — Z7984 Long term (current) use of oral hypoglycemic drugs: Secondary | ICD-10-CM | POA: Diagnosis not present

## 2022-03-08 LAB — URINALYSIS, ROUTINE W REFLEX MICROSCOPIC
Bacteria, UA: NONE SEEN
Bilirubin Urine: NEGATIVE
Glucose, UA: >=500 mg/dL — AB
Hgb urine dipstick: NEGATIVE
Ketones, ur: 20 mg/dL — AB
Leukocytes,Ua: NEGATIVE
Nitrite: NEGATIVE
Protein, ur: NEGATIVE mg/dL
Specific Gravity, Urine: 1.009 (ref 1.005–1.030)
pH: 6 (ref 5.0–8.0)

## 2022-03-08 LAB — BASIC METABOLIC PANEL
Anion gap: 8 (ref 5–15)
BUN: 16 mg/dL (ref 8–23)
CO2: 25 mmol/L (ref 22–32)
Calcium: 8.8 mg/dL — ABNORMAL LOW (ref 8.9–10.3)
Chloride: 107 mmol/L (ref 98–111)
Creatinine, Ser: 0.81 mg/dL (ref 0.44–1.00)
GFR, Estimated: >60 mL/min (ref >60)
Glucose, Bld: 87 mg/dL (ref 70–99)
Potassium: 4.1 mmol/L (ref 3.5–5.1)
Sodium: 140 mmol/L (ref 135–145)

## 2022-03-08 LAB — CBC
HCT: 36.6 % (ref 36.0–46.0)
Hemoglobin: 12.3 g/dL (ref 12.0–15.0)
MCH: 31.3 pg (ref 26.0–34.0)
MCHC: 33.6 g/dL (ref 30.0–36.0)
MCV: 93.1 fL (ref 80.0–100.0)
Platelets: 198 10*3/uL (ref 150–400)
RBC: 3.93 MIL/uL (ref 3.87–5.11)
RDW: 12.5 % (ref 11.5–15.5)
WBC: 8.3 10*3/uL (ref 4.0–10.5)
nRBC: 0 % (ref 0.0–0.2)

## 2022-03-08 LAB — CBG MONITORING, ED: Glucose-Capillary: 92 mg/dL (ref 70–99)

## 2022-03-08 NOTE — ED Provider Notes (Signed)
[facility] DEPT Provider Note: Georgena Spurling, MD, FACEP  CSN: [id number] MRN: [id number] ARRIVAL: 03/08/22 at [place]: [place]  Hyperglycemia   HISTORY OF PRESENT ILLNESS  03/08/22 11:04 PM Megan Cain is a 61 y.o. female who was recently diagnosed with type 1 diabetes.  She awakened this morning about 4 AM with some epigastric discomfort with associated nausea and retching.  She checked her urine with a dipstick and found her ketones to be elevated.  She was concerned that she was in diabetic ketoacidosis.  She ate very little throughout the day but did drink a lot of fluids.  She contacted her endocrinologist who suggested she come to the ED out of concern for diabetic ketoacidosis.  On arrival here her sugar was 92.  Her epigastric discomfort, nausea and retching have resolved.  She has not had any diarrhea with this.   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 shoulder   SQUAMOUS CELL CARCINOMA EXCISION  09/2015   Right ear   thumb surgery Left 1975    Family History  Problem Relation Age of Onset   Hypertension Mother    Breast cancer Mother    Heart disease Father    Hyperlipidemia Father    Stroke Father    Leukemia Brother    Diabetes Son        type 1 in both sons   Diabetes Son    Breast cancer Maternal Aunt    Colon cancer Neg Hx    Stomach cancer Neg Hx    Esophageal cancer  Neg Hx     Social History   Tobacco Use   Smoking status: Never   Smokeless tobacco: Never  Vaping Use   Vaping Use: Never used  Substance Use Topics   Alcohol use: Yes    Alcohol/week: 1.0 standard drink of alcohol    Types: 1 Cans of beer per week   Drug use: No    Prior to Admission medications   Medication Sig Start Date End Date Taking? Authorizing Provider  B Complex Vitamins (B COMPLEX PO) Take 1 tablet by mouth daily.   Yes [provider]  budesonide-formoterol (SYMBICORT) 160-4.5 MCG/ACT inhaler Inhale 2 puffs into the lungs 2 (two) times daily.   Yes [provider]  cholecalciferol (VITAMIN D) 1000 UNITS tablet Take 1,000 Units by mouth daily.   Yes [provider]  Coenzyme Q10 (CO Q 10 PO) Take 1 tablet by mouth daily.   Yes [provider]  Cyanocobalamin (VITAMIN B-12 PO) Take 1 tablet by mouth daily.   Yes [provider]  fish oil-omega-3 fatty acids 1000 MG capsule Take 2 g by mouth daily.   Yes [provider]  fluticasone (FLONASE) 50 MCG/ACT nasal spray Place into both nostrils daily.   Yes [provider]  HUMALOG JUNIOR KWIKPEN 100 UNIT/ML KwikPen Junior Inject 2 Units into the skin as needed. 03/04/22  Yes [provider]  JARDIANCE 25 MG TABS tablet Take 25 mg by mouth daily. 05/15/21  Yes [provider]  lamoTRIgine (LAMICTAL) 100 MG tablet Take 100 mg by mouth 2 (two) times daily. 03/02/22  Yes [provider]  LANTUS SOLOSTAR 100 UNIT/ML Solostar Pen Inject 6 Units into the skin 2 (two) times daily. 01/02/22  Yes [provider]  loratadine (CLARITIN) 10 MG tablet Take 10 mg by mouth daily.   Yes [provider]  MAGNESIUM PO Take 1 tablet by mouth daily.   Yes [provider]  montelukast (SINGULAIR) 10 MG tablet Take 10 mg by mouth at bedtime.   Yes [provider]  Multiple Vitamins-Minerals (MULTIVITAMIN WITH MINERALS) tablet Take  1 tablet by mouth daily.   Yes [provider]  Probiotic Product (PROBIOTIC DAILY PO) Take 1 tablet by mouth daily.   Yes [provider]  rosuvastatin (CRESTOR) 5 MG tablet Take 5 mg by mouth daily. 02/10/22  Yes [provider]  thyroid (ARMOUR) 15 MG tablet Take 15 mg by mouth daily. Takes with '60mg'$    Yes [provider]  thyroid (ARMOUR) 60 MG tablet Take 60 mg by mouth daily before breakfast. Takes with '15mg'$    Yes [provider]  Turmeric 450 MG CAPS Take by mouth 2 (two) times daily.   Yes [provider]  Continuous Blood Gluc Sensor (DEXCOM G4 SENSOR) MISC by Does not apply route.    [provider]  metFORMIN (GLUCOPHAGE-XR) 500 MG 24 hr tablet TAKE ONE TABLET BY MOUTH EVERY MORNING AND 2 TABLETS IN THE EVENING Patient not taking: Reported on 03/08/2022    [provider]    Allergies Lisinopril   REVIEW OF SYSTEMS  Negative except as noted here or in the History of Present Illness.   PHYSICAL EXAMINATION  Initial Vital Signs Blood pressure 138/79, pulse 63, temperature 99.5 F (37.5 C), temperature source Oral, resp. rate 18, height '5\' 3"'$  (1.6 m), weight 57.6 kg, last menstrual period 02/21/2015, SpO2 98 %.  Examination General: Well-developed, well-nourished female in no acute distress; appearance consistent with age of record HENT: normocephalic; atraumatic Eyes: Normal appearance Neck: supple Heart: regular rate and rhythm Lungs: clear to auscultation bilaterally Abdomen: soft; nondistended; nontender; bowel sounds present Extremities: No deformity; full range of motion; pulses normal Neurologic: Awake, alert and oriented; motor function intact in all extremities and symmetric; no facial droop Skin: Warm and dry Psychiatric: Normal mood and affect   RESULTS  Summary of this visit's results, reviewed and interpreted by myself:   EKG Interpretation  Date/Time:    Ventricular Rate:    PR  Interval:    QRS Duration:   QT Interval:    QTC Calculation:   R Axis:     Text Interpretation:         Laboratory Studies: Results for orders placed or performed during the hospital encounter of 03/08/22 (from the past 24 hour(s))  CBG monitoring, ED     Status: None   Collection Time: 03/08/22  7:33 PM  Result Value Ref Range   Glucose-Capillary 92 70 - 99 mg/dL  Basic metabolic panel     Status: Abnormal   Collection Time: 03/08/22  9:10 PM  Result Value Ref Range  Sodium 140 135 - 145 mmol/L   Potassium 4.1 3.5 - 5.1 mmol/L   Chloride 107 98 - 111 mmol/L   CO2 25 22 - 32 mmol/L   Glucose, Bld 87 70 - 99 mg/dL   BUN 16 8 - 23 mg/dL   Creatinine, Ser 0.81 0.44 - 1.00 mg/dL   Calcium 8.8 (L) 8.9 - 10.3 mg/dL   GFR, Estimated >60 >60 mL/min   Anion gap 8 5 - 15  CBC     Status: None   Collection Time: 03/08/22  9:10 PM  Result Value Ref Range   WBC 8.3 4.0 - 10.5 K/uL   RBC 3.93 3.87 - 5.11 MIL/uL   Hemoglobin 12.3 12.0 - 15.0 g/dL   HCT 36.6 36.0 - 46.0 %   MCV 93.1 80.0 - 100.0 fL   MCH 31.3 26.0 - 34.0 pg   MCHC 33.6 30.0 - 36.0 g/dL   RDW 12.5 11.5 - 15.5 %   Platelets 198 150 - 400 K/uL   nRBC 0.0 0.0 - 0.2 %  Urinalysis, Routine w reflex microscopic Urine, Clean Catch     Status: Abnormal   Collection Time: 03/08/22  9:10 PM  Result Value Ref Range   Color, Urine STRAW (A) YELLOW   APPearance CLEAR CLEAR   Specific Gravity, Urine 1.009 1.005 - 1.030   pH 6.0 5.0 - 8.0   Glucose, UA >=500 (A) NEGATIVE mg/dL   Hgb urine dipstick NEGATIVE NEGATIVE   Bilirubin Urine NEGATIVE NEGATIVE   Ketones, ur 20 (A) NEGATIVE mg/dL   Protein, ur NEGATIVE NEGATIVE mg/dL   Nitrite NEGATIVE NEGATIVE   Leukocytes,Ua NEGATIVE NEGATIVE   RBC / HPF 0-5 0 - 5 RBC/hpf   WBC, UA 0-5 0 - 5 WBC/hpf   Bacteria, UA NONE SEEN NONE SEEN   Imaging Studies: No results found.  ED COURSE and MDM  Nursing notes, initial and subsequent vitals signs, including pulse oximetry, reviewed  and interpreted by myself.  Vitals:   03/08/22 1933 03/08/22 2100 03/08/22 2150 03/08/22 2230  BP: (!) 161/87 127/73 139/74 138/79  Pulse: 79 67 63 63  Resp: '18 18 18 18  '$ Temp: 99.5 F (37.5 C)     TempSrc: Oral     SpO2: 95% 95% 99% 98%  Weight:  57.6 kg    Height:  '5\' 3"'$  (1.6 m)     Medications - No data to display  The patient's laboratory studies are essentially within normal limits except for glucosuria and small ketones in the urine.  The ketones may be due to lack of intake today.  She has been able to keep yourself hydrated and would prefer not to get IV fluids in the ED.  She intends to hydrate herself orally at home.  There is no evidence of diabetic ketoacidosis at this time.  PROCEDURES  Procedures   ED DIAGNOSES     ICD-10-CM   1. Epigastric discomfort  R10.13     2. Nausea and vomiting in adult  R11.2          Shanon Rosser, MD 03/08/22 [phone]

## 2022-03-08 NOTE — ED Triage Notes (Signed)
Patient said she is a newly diagnosed type 1 diabetic (for 3 months). Said she woke up with abdominal pain and dry heaving, checked her urine and it had ketones.

## 2022-03-12 ENCOUNTER — Other Ambulatory Visit: Payer: Self-pay | Admitting: Allergy and Immunology

## 2022-03-12 ENCOUNTER — Ambulatory Visit
Admission: RE | Admit: 2022-03-12 | Discharge: 2022-03-12 | Disposition: A | Discharge status: Home / Self Care | Payer: 59 | Source: Ambulatory Visit | Attending: Allergy and Immunology | Admitting: Allergy and Immunology

## 2022-03-12 DIAGNOSIS — J45991 Cough variant asthma: Secondary | ICD-10-CM

## 2022-04-07 ENCOUNTER — Encounter: Payer: Self-pay | Admitting: Gastroenterology

## 2022-04-10 ENCOUNTER — Encounter: Payer: Self-pay | Admitting: Gastroenterology

## 2022-04-10 ENCOUNTER — Ambulatory Visit (AMBULATORY_SURGERY_CENTER): Payer: 59 | Admitting: Gastroenterology

## 2022-04-10 VITALS — BP 118/70 | HR 57 | Temp 96.8°F | Resp 18 | Ht 63.0 in | Wt 127.0 lb

## 2022-04-10 DIAGNOSIS — Z1212 Encounter for screening for malignant neoplasm of rectum: Secondary | ICD-10-CM | POA: Diagnosis not present

## 2022-04-10 DIAGNOSIS — Z1211 Encounter for screening for malignant neoplasm of colon: Secondary | ICD-10-CM | POA: Diagnosis present

## 2022-04-10 MED ORDER — SODIUM CHLORIDE 0.9 % IV SOLN: 500.0000 mL | Freq: Once | INTRAVENOUS | Status: DC

## 2022-04-10 NOTE — Progress Notes (Signed)
VS completed by DT.  Pt's states no medical or surgical changes since previsit or office visit.   Pt's blood sugar 77 this morning, patient stated that it dropped from this morning at home when she took it before coming in and was worried that it might drop more. Patient's having no symptoms of low blood sugar at this time. Did hand dextrose fluids slowly, will re-check blood sugar in 15 minutes. MD aware. Blood sugar was rechecked and it was 95, dextrose fluid off.

## 2022-04-10 NOTE — Progress Notes (Signed)
PT taken to PACU. Monitors in place. VSS. Report given to RN. 

## 2022-04-10 NOTE — Op Note (Signed)
[place] Patient Name: Megan Cain Procedure Date: 04/10/2022 8:01 AM MRN: [id number] Endoscopist: Nicki Reaper E. Candis Schatz , MD Age: 61 Referring MD:  Date of Birth: Mar 30, 1961 Gender: Female Account #: [id number] Procedure:                Colonoscopy Indications:              Screening for colorectal malignant neoplasm (last                            colonoscopy was 10 years ago) Medicines:                Monitored Anesthesia Care Procedure:                Pre-Anesthesia Assessment:                           - Prior to the procedure, a History and Physical                            was performed, and patient medications and                            allergies were reviewed. The patient's tolerance of                            previous anesthesia was also reviewed. The risks                            and benefits of the procedure and the sedation                            options and risks were discussed with the patient.                            All questions were answered, and informed consent                            was obtained. Prior Anticoagulants: The patient has                            taken no previous anticoagulant or antiplatelet                            agents. ASA Grade Assessment: II - A patient with                            mild systemic disease. After reviewing the risks                            and benefits, the patient was deemed in                            satisfactory condition to undergo the procedure.  After obtaining informed consent, the colonoscope                            was passed under direct vision. Throughout the                            procedure, the patient's blood pressure, pulse, and                            oxygen saturations were monitored continuously. The                            Olympus CF-HQ190L (Serial# [id number]) Colonoscope was                            introduced through  the anus and advanced to the the                            terminal ileum, with identification of the                            appendiceal orifice and IC valve. The colonoscopy                            was performed without difficulty. The patient                            tolerated the procedure well. The quality of the                            bowel preparation was excellent. The terminal                            ileum, ileocecal valve, appendiceal orifice, and                            rectum were photographed. The bowel preparation                            used was SUPREP via split dose instruction. Scope In: 8:06:48 AM Scope Out: 8:20:18 AM Scope Withdrawal Time: 0 hours 7 minutes 54 seconds  Total Procedure Duration: 0 hours 13 minutes 30 seconds  Findings:                 The perianal and digital rectal examinations were                            normal. Pertinent negatives include normal                            sphincter tone and no palpable rectal lesions.                           The colon (entire examined portion) appeared normal.  The terminal ileum appeared normal.                           The retroflexed view of the distal rectum and anal                            verge was normal and showed no anal or rectal                            abnormalities. Complications:            No immediate complications. Estimated Blood Loss:     Estimated blood loss: none. Impression:               - The entire examined colon is normal.                           - The examined portion of the ileum was normal.                           - The distal rectum and anal verge are normal on                            retroflexion view.                           - No specimens collected. Recommendation:           - Patient has a contact number available for                            emergencies. The signs and symptoms of potential                             delayed complications were discussed with the                            patient. Return to normal activities tomorrow.                            Written discharge instructions were provided to the                            patient.                           - Resume previous diet.                           - Continue present medications.                           - Repeat colonoscopy in 10 years for screening                            purposes. [name] E. Candis Schatz, MD 04/10/2022 8:28:42 AM This report has been signed electronically.

## 2022-04-10 NOTE — Progress Notes (Signed)
[facility] History and Physical   Primary Care Physician:  Hayden Rasmussen, MD   Reason for Procedure:        Colon cancer screening    Plan:    Screening colonoscopy     HPI: Megan Cain is a 61 y.o. female undergoing average risk screening colonoscopy.  She has no family history of colon cancer and no chronic GI symptoms. She had a normal colonoscopy in 2013.   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 shoulder   SQUAMOUS CELL CARCINOMA EXCISION  09/2015   Right ear   thumb surgery Left 1975    Prior to Admission medications   Medication Sig Start Date End Date Taking? Authorizing Provider  B Complex Vitamins (B COMPLEX PO) Take 1 tablet by mouth daily.   Yes [provider]  budesonide-formoterol (SYMBICORT) 160-4.5 MCG/ACT inhaler Inhale 2 puffs into the lungs 2 (two) times daily.   Yes [provider]  cholecalciferol (VITAMIN D) 1000 UNITS tablet Take 1,000 Units by mouth daily.   Yes [provider]  Coenzyme Q10 (CO Q 10 PO) Take 1 tablet by mouth daily.   Yes [provider]  Cyanocobalamin (VITAMIN B-12 PO) Take 1 tablet by mouth daily.   Yes [provider]  fish oil-omega-3 fatty acids 1000 MG capsule Take 2 g by mouth daily.   Yes [provider]  fluticasone (FLONASE) 50 MCG/ACT nasal spray Place into both nostrils daily.    Yes [provider]  HUMALOG JUNIOR KWIKPEN 100 UNIT/ML KwikPen Junior Inject 2 Units into the skin as needed. 03/04/22  Yes [provider]  JARDIANCE 25 MG TABS tablet Take 25 mg by mouth daily. 05/15/21  Yes [provider]  lamoTRIgine (LAMICTAL) 100 MG tablet Take 100 mg by mouth 2 (two) times daily. 03/02/22  Yes [provider]  LANTUS SOLOSTAR 100 UNIT/ML Solostar Pen Inject 6 Units into the skin 2 (two) times daily. 01/02/22  Yes [provider]  loratadine (CLARITIN) 10 MG tablet Take 10 mg by mouth daily.   Yes [provider]  MAGNESIUM PO Take 1 tablet by mouth daily.   Yes [provider]  montelukast (SINGULAIR) 10 MG tablet Take 10 mg by mouth at bedtime.   Yes [provider]  Multiple Vitamins-Minerals (MULTIVITAMIN WITH MINERALS) tablet Take 1 tablet by mouth daily.   Yes [provider]  Probiotic Product (PROBIOTIC DAILY PO) Take 1 tablet by mouth daily.   Yes [provider]  rosuvastatin (CRESTOR) 5 MG tablet Take 5 mg by mouth daily. 02/10/22  Yes [provider]  thyroid (ARMOUR) 15 MG tablet Take 15 mg by mouth daily. Takes  with '60mg'$    Yes [provider]  thyroid (ARMOUR) 60 MG tablet Take 60 mg by mouth daily before breakfast. Takes with '15mg'$    Yes [provider]  Turmeric 450 MG CAPS Take by mouth 2 (two) times daily.   Yes [provider]  Continuous Blood Gluc Sensor (DEXCOM G4 SENSOR) MISC by Does not apply route. Patient not taking: Reported on 04/10/2022    [provider]    Current Outpatient Medications  Medication Sig Dispense Refill   B Complex Vitamins (B COMPLEX PO) Take 1 tablet by mouth daily.     budesonide-formoterol (SYMBICORT) 160-4.5 MCG/ACT inhaler Inhale 2 puffs into the lungs 2 (two) times daily.     cholecalciferol (VITAMIN D) 1000 UNITS tablet Take 1,000 Units by mouth daily.     Coenzyme Q10 (CO Q 10 PO) Take 1  tablet by mouth daily.     Cyanocobalamin (VITAMIN B-12 PO) Take 1 tablet by mouth daily.     fish oil-omega-3 fatty acids 1000 MG capsule Take 2 g by mouth daily.     fluticasone (FLONASE) 50 MCG/ACT nasal spray Place into both nostrils daily.     HUMALOG JUNIOR KWIKPEN 100 UNIT/ML KwikPen Junior Inject 2 Units into the skin as needed.     JARDIANCE 25 MG TABS tablet Take 25 mg by mouth daily.     lamoTRIgine (LAMICTAL) 100 MG tablet Take 100 mg by mouth 2 (two) times daily.     LANTUS SOLOSTAR 100 UNIT/ML Solostar Pen Inject 6 Units into the skin 2 (two) times daily.     loratadine (CLARITIN) 10 MG tablet Take 10 mg by mouth daily.     MAGNESIUM PO Take 1 tablet by mouth daily.     montelukast (SINGULAIR) 10 MG tablet Take 10 mg by mouth at bedtime.     Multiple Vitamins-Minerals (MULTIVITAMIN WITH MINERALS) tablet Take 1 tablet by mouth daily.     Probiotic Product (PROBIOTIC DAILY PO) Take 1 tablet by mouth daily.     rosuvastatin (CRESTOR) 5 MG tablet Take 5 mg by mouth daily.     thyroid (ARMOUR) 15 MG tablet Take 15 mg by mouth daily. Takes with '60mg'$      thyroid (ARMOUR) 60 MG tablet Take 60 mg by mouth daily before breakfast. Takes with '15mg'$      Turmeric 450 MG CAPS Take by mouth 2 (two) times daily.     Continuous Blood Gluc Sensor (DEXCOM G4 SENSOR) MISC by Does not apply route. (Patient not taking: Reported on 04/10/2022)     Current Facility-Administered Medications  Medication Dose Route Frequency Provider Last Rate Last Admin   0.9 %  sodium chloride infusion  500 mL Intravenous Once Daryel November, MD        Allergies as of 04/10/2022 - Review Complete 04/10/2022  Allergen Reaction Noted   Lisinopril Other (See Comments) and Cough 04/15/2021    Family History  Problem Relation Age of Onset   Hypertension Mother    Breast cancer Mother    Heart disease Father    Hyperlipidemia Father    Stroke Father    Leukemia Brother    Breast cancer Maternal Aunt     Diabetes Son        type 1 in both sons   Diabetes Son    Colon cancer Neg Hx    Stomach cancer Neg Hx    Esophageal cancer Neg Hx    Rectal cancer Neg Hx  Social History   Socioeconomic History   Marital status: Married    Spouse name: Not on file   Number of children: 2   Years of education: Not on file   Highest education level: Not on file  Occupational History   Occupation: Retired  Tobacco Use   Smoking status: Never   Smokeless tobacco: Never  Vaping Use   Vaping Use: Never used  Substance and Sexual Activity   Alcohol use: Yes    Alcohol/week: 1.0 standard drink of alcohol    Types: 1 Cans of beer per week   Drug use: No   Sexual activity: Yes    Partners: Male    Birth control/protection: Post-menopausal  Other Topics Concern   Not on file  Social History Narrative   Not on file   Social Determinants of Health   Financial Resource Strain: Not on file  Food Insecurity: Not on file  Transportation Needs: Not on file  Physical Activity: Not on file  Stress: Not on file  Social Connections: Not on file  Intimate Partner Violence: Not on file    Review of Systems:  All other review of systems negative except as mentioned in the HPI.  Physical Exam: Vital signs BP (!) 140/79   Pulse 66   Temp (!) 96.8 F (36 C) (Temporal)   Ht '5\' 3"'$  (1.6 m)   Wt 127 lb (57.6 kg)   LMP 02/21/2015 (Exact Date)   SpO2 100%   BMI 22.50 kg/m   General:   Alert,  Well-developed, well-nourished, pleasant and cooperative in NAD Airway:  Mallampati 2 Lungs:  Clear throughout to auscultation.   Heart:  Regular rate and rhythm; no murmurs, clicks, rubs,  or gallops. Abdomen:  Soft, nontender and nondistended. Normal bowel sounds.   Neuro/Psych:  Normal mood and affect. A and O x 3   Kevron Patella E. Candis Schatz, MD [facility]

## 2022-04-10 NOTE — Patient Instructions (Signed)
Resume previous diet and medications. Repeat Colonoscopy in 10 years for surveillance.  YOU HAD AN ENDOSCOPIC PROCEDURE TODAY AT THE [facility]:   Refer to the procedure report that was given to you for any specific questions about what was found during the examination.  If the procedure report does not answer your questions, please call your gastroenterologist to clarify.  If you requested that your care partner not be given the details of your procedure findings, then the procedure report has been included in a sealed envelope for you to review at your convenience later.  YOU SHOULD EXPECT: Some feelings of bloating in the abdomen. Passage of more gas than usual.  Walking can help get rid of the air that was put into your GI tract during the procedure and reduce the bloating. If you had a lower endoscopy (such as a colonoscopy or flexible sigmoidoscopy) you may notice spotting of blood in your stool or on the toilet paper. If you underwent a bowel prep for your procedure, you may not have a normal bowel movement for a few days.  Please Note:  You might notice some irritation and congestion in your nose or some drainage.  This is from the oxygen used during your procedure.  There is no need for concern and it should clear up in a day or so.  SYMPTOMS TO REPORT IMMEDIATELY:  Following lower endoscopy (colonoscopy or flexible sigmoidoscopy):  Excessive amounts of blood in the stool  Significant tenderness or worsening of abdominal pains  Swelling of the abdomen that is new, acute  Fever of 100F or higher   For urgent or emergent issues, a gastroenterologist can be reached at any hour by calling [phone]. Do not use MyChart messaging for urgent concerns.    DIET:  We do recommend a small meal at first, but then you may proceed to your regular diet.  Drink plenty of fluids but you should avoid alcoholic beverages for 24 hours.  ACTIVITY:  You should plan to take it easy for  the rest of today and you should NOT DRIVE or use heavy machinery until tomorrow (because of the sedation medicines used during the test).    FOLLOW UP: Our staff will call the number listed on your records the next business day following your procedure.  We will call around 7:15- 8:00 am to check on you and address any questions or concerns that you may have regarding the information given to you following your procedure. If we do not reach you, we will leave a message.     If any biopsies were taken you will be contacted by phone or by letter within the next 1-3 weeks.  Please call us at [phone] if you have not heard about the biopsies in 3 weeks.    SIGNATURES/CONFIDENTIALITY: You and/or your care partner have signed paperwork which will be entered into your electronic medical record.  These signatures attest to the fact that that the information above on your After Visit Summary has been reviewed and is understood.  Full responsibility of the confidentiality of this discharge information lies with you and/or your care-partner. 

## 2022-04-13 ENCOUNTER — Telehealth: Payer: Self-pay | Admitting: *Deleted

## 2022-04-13 NOTE — Telephone Encounter (Signed)
Follow up call attempt.  LVM to call if any questions or concerns. 

## 2022-06-26 DIAGNOSIS — M25561 Pain in right knee: Secondary | ICD-10-CM | POA: Diagnosis not present

## 2022-06-26 DIAGNOSIS — S39012D Strain of muscle, fascia and tendon of lower back, subsequent encounter: Secondary | ICD-10-CM | POA: Diagnosis not present

## 2022-06-26 DIAGNOSIS — M542 Cervicalgia: Secondary | ICD-10-CM | POA: Diagnosis not present

## 2022-07-23 DIAGNOSIS — J342 Deviated nasal septum: Secondary | ICD-10-CM | POA: Diagnosis not present

## 2022-07-23 DIAGNOSIS — R0982 Postnasal drip: Secondary | ICD-10-CM | POA: Diagnosis not present

## 2022-07-23 DIAGNOSIS — J343 Hypertrophy of nasal turbinates: Secondary | ICD-10-CM | POA: Diagnosis not present

## 2022-07-23 DIAGNOSIS — J310 Chronic rhinitis: Secondary | ICD-10-CM | POA: Diagnosis not present

## 2022-07-24 DIAGNOSIS — S39012D Strain of muscle, fascia and tendon of lower back, subsequent encounter: Secondary | ICD-10-CM | POA: Diagnosis not present

## 2022-07-24 DIAGNOSIS — M542 Cervicalgia: Secondary | ICD-10-CM | POA: Diagnosis not present

## 2022-07-24 DIAGNOSIS — M25561 Pain in right knee: Secondary | ICD-10-CM | POA: Diagnosis not present

## 2022-08-18 DIAGNOSIS — E559 Vitamin D deficiency, unspecified: Secondary | ICD-10-CM | POA: Diagnosis not present

## 2022-08-18 DIAGNOSIS — E1165 Type 2 diabetes mellitus with hyperglycemia: Secondary | ICD-10-CM | POA: Diagnosis not present

## 2022-08-18 DIAGNOSIS — E039 Hypothyroidism, unspecified: Secondary | ICD-10-CM | POA: Diagnosis not present

## 2022-08-18 DIAGNOSIS — E7800 Pure hypercholesterolemia, unspecified: Secondary | ICD-10-CM | POA: Diagnosis not present

## 2022-08-21 DIAGNOSIS — S39012D Strain of muscle, fascia and tendon of lower back, subsequent encounter: Secondary | ICD-10-CM | POA: Diagnosis not present

## 2022-08-21 DIAGNOSIS — M542 Cervicalgia: Secondary | ICD-10-CM | POA: Diagnosis not present

## 2022-08-21 DIAGNOSIS — M25561 Pain in right knee: Secondary | ICD-10-CM | POA: Diagnosis not present

## 2022-08-25 DIAGNOSIS — E7800 Pure hypercholesterolemia, unspecified: Secondary | ICD-10-CM | POA: Diagnosis not present

## 2022-08-25 DIAGNOSIS — I10 Essential (primary) hypertension: Secondary | ICD-10-CM | POA: Diagnosis not present

## 2022-08-25 DIAGNOSIS — E139 Other specified diabetes mellitus without complications: Secondary | ICD-10-CM | POA: Diagnosis not present

## 2022-08-25 DIAGNOSIS — E039 Hypothyroidism, unspecified: Secondary | ICD-10-CM | POA: Diagnosis not present

## 2022-09-24 DIAGNOSIS — M25511 Pain in right shoulder: Secondary | ICD-10-CM | POA: Diagnosis not present

## 2022-09-24 DIAGNOSIS — M25561 Pain in right knee: Secondary | ICD-10-CM | POA: Diagnosis not present

## 2022-09-24 DIAGNOSIS — M542 Cervicalgia: Secondary | ICD-10-CM | POA: Diagnosis not present

## 2022-09-24 DIAGNOSIS — S39012D Strain of muscle, fascia and tendon of lower back, subsequent encounter: Secondary | ICD-10-CM | POA: Diagnosis not present

## 2022-10-02 DIAGNOSIS — E109 Type 1 diabetes mellitus without complications: Secondary | ICD-10-CM | POA: Diagnosis not present

## 2022-10-02 DIAGNOSIS — Z794 Long term (current) use of insulin: Secondary | ICD-10-CM | POA: Diagnosis not present

## 2022-10-07 ENCOUNTER — Ambulatory Visit (INDEPENDENT_AMBULATORY_CARE_PROVIDER_SITE_OTHER): Payer: BC Managed Care – PPO | Admitting: Psychology

## 2022-10-07 DIAGNOSIS — F4323 Adjustment disorder with mixed anxiety and depressed mood: Secondary | ICD-10-CM | POA: Diagnosis not present

## 2022-10-07 NOTE — Progress Notes (Signed)
[place] [facility] Initial Adult Exam  Name: Megan Cain [facility] Date: 10/07/2022 MRN: [id number] DOB: 08-10-1960 PCP: Hayden Rasmussen, MD  Time spent: 55 minutes  Guardian/Payee:  N/A    Paperwork requested: No   Reason for Visit /Presenting Problem: mild depression and anxiety, grief, poor self-esteem  Mental Status Exam: Appearance:   Casual     Behavior:  Appropriate  Motor:  Normal  Speech/Language:   Clear and Coherent  Affect:  Appropriate  Mood:  normal  Thought process:  normal  Thought content:    WNL  Sensory/Perceptual disturbances:    WNL  Orientation:  oriented to person, place, and situation  Attention:  Good  Concentration:  Good  Memory:  WNL  Fund of knowledge:   Good  Insight:    Good  Judgment:   Good  Impulse Control:  Good     Reported Symptoms:  guilt, grief (sadness), depleted sense of self  Risk Assessment: Danger to Self:  No Self-injurious Behavior: No Danger to Others: No Duty to Warn:no Physical Aggression / Violence:No  Access to Firearms a concern: No  Gang Involvement:No  Patient / guardian was educated about steps to take if suicide or homicide risk level increases between visits: n/a While future psychiatric events cannot be accurately predicted, the patient does not currently require acute inpatient psychiatric care and does not currently meet [facility] involuntary commitment criteria.  Substance Abuse History: Current substance abuse: No     Past Psychiatric History:   No previous psychological problems have been observed Outpatient Providers:N/A History of Psych Hospitalization: No  Psychological Testing:  N/A    Abuse History:  Victim of: No.,  N/A    Report needed: No. Victim of Neglect:No. Perpetrator of  N/A   Witness / Exposure to Domestic Violence: No   Protective Services Involvement: No  Witness to [employer] Violence:  No   Family History:  Family  History  Problem Relation Age of Onset   Hypertension Mother    Breast cancer Mother    Heart disease Father    Hyperlipidemia Father    Stroke Father    Leukemia Brother    Breast cancer Maternal Aunt    Diabetes Son        type 1 in both sons   Diabetes Son    Colon cancer Neg Hx    Stomach cancer Neg Hx    Esophageal cancer Neg Hx    Rectal cancer Neg Hx     Living situation: the patient lives with their spouse  Sexual Orientation: Straight  Relationship Status: married  Name of spouse / other:[name] If a parent, number of children / ages:Two boys, ages 35 and 73  Support Systems: spouse  Museum/gallery curator Stress:  No   Income/Employment/Disability: Employment  Armed forces logistics/support/administrative officer: No   Educational History: Education:  unknown  Environmental consultant: unknown  Any cultural differences that may affect / interfere with treatment:  not applicable   Recreation/Hobbies: reading  Stressors: Marital or family conflict    Strengths: Supportive Relationships  Barriers:  undetermined   Legal History: Pending legal issue / charges: The patient has no significant history of legal issues. 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 shoulder   SQUAMOUS CELL CARCINOMA EXCISION  09/2015   Right ear   thumb surgery  Left 1975    Medications: Current Outpatient Medications  Medication Sig Dispense Refill   B Complex Vitamins (B COMPLEX PO) Take 1 tablet by mouth daily.     budesonide-formoterol (SYMBICORT) 160-4.5 MCG/ACT inhaler Inhale 2 puffs into the lungs 2 (two) times daily.     cholecalciferol (VITAMIN D) 1000 UNITS tablet Take 1,000 Units by mouth daily.     Coenzyme Q10 (CO Q 10 PO) Take 1 tablet by mouth daily.     Continuous Blood Gluc Sensor (DEXCOM G4 SENSOR) MISC by Does not apply route. (Patient not taking: Reported on 04/10/2022)     Cyanocobalamin (VITAMIN B-12 PO) Take 1 tablet by mouth daily.     fish oil-omega-3 fatty acids 1000 MG capsule Take 2 g by mouth daily.     fluticasone (FLONASE) 50 MCG/ACT nasal spray Place into both nostrils daily.     HUMALOG JUNIOR KWIKPEN 100 UNIT/ML KwikPen Junior Inject 2 Units into the skin as needed.     JARDIANCE 25 MG TABS tablet Take 25 mg by mouth daily.     lamoTRIgine (LAMICTAL) 100 MG tablet Take 100 mg by mouth 2 (two) times daily.     LANTUS SOLOSTAR 100 UNIT/ML Solostar Pen Inject 6 Units into the skin 2 (two) times daily.     loratadine (CLARITIN) 10 MG tablet Take 10 mg by mouth daily.     MAGNESIUM PO Take 1 tablet by mouth daily.     montelukast (SINGULAIR) 10 MG tablet Take 10 mg by mouth at bedtime.     Multiple Vitamins-Minerals (MULTIVITAMIN WITH MINERALS) tablet Take 1 tablet by mouth daily.     Probiotic Product (PROBIOTIC DAILY PO) Take 1 tablet by mouth daily.     rosuvastatin (CRESTOR) 5 MG tablet Take 5 mg by mouth daily.     thyroid (ARMOUR) 15 MG tablet Take 15 mg by mouth daily. Takes with 60mg      thyroid (ARMOUR) 60 MG tablet Take 60 mg by mouth daily before breakfast. Takes with 15mg      Turmeric 450 MG CAPS Take by mouth 2 (two) times daily.     No current facility-administered medications for this visit.    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 She has issues of grief separation and abandonment. Recent diagnosis of type 1 diabetic and insulin dependent. Her sons are diabetic as well. Most recently consumed with the news that her father's wife is terminally ill. Father died a few years ago, and Jazlee had  a lot of anger and resentment toward father's wife. She is angry, bitter and hurt toward this woman, who also treated Jana Half poorly. Says her father's closest friends also had issues with his wife. Zhana knew this woman for almost 30 years and they are very different. They did not really connect well from the start. This woman was very controlling. Alyena says "I don't like people to dislike me and her step mother did not like her. Tashanti feels guilty and fears that family members have a bad perception of her because of her father's wife. In particular, her deceased brother's family had a good relationship with her step-mother because they lived so far away [facility]).  Her parents divorced in 38 when Gargi was 78. They had separated twice before. It was a bitter divorce and mother did not want the divorce. Omia has 1 deceased brother who was 21 years older and a sister 4 years younger (in [place], [place]). Brother died of complications from bone marrow transplant. Her mother in law was diagnosed with cancer around same time and passed away. Her brother was best friends with her husband and they worked together. Her husband, father and brother worked in their own business together. Her husband  Gaspar Bidding) now owns the business with Jana Half. 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 Calah and Gaspar Bidding married 33 years and together 8 before that. They have 2 boys. (Dylan age 74 and [place] age 57). Both live in [place]. Laurann [place] lives with girlfriend. Both boys have diabetes. Her marital relationship is "up and down". Relationship with mother: "She  was not a nurturing person". Analiya became very independent when her parents divorced. She wonders if mother is on spectrum. She says mom was quirky, especially socially. Mom was not affectionate and they did not share interests. Mom had lots of friends, but not with a lot of depth.  Her "grief" hits her "really hard". Lots of loss in her life. Uncle committed suicide, brother's death and mother in [place] death. Magaret gets very sad with separation and thinks it was because of parents separations and divorce. When her mother went away to teach, it was devastating for Ketia and felt that maybe she was to blame.  Her weekly routine: Says she has backed off because they now "butt heads". So goes to see her at [facility] once a week and drives her to appointments. She now goes out to visit to help her do laundry and other tasks.  She states that depression runs in her family. She tries to focus on self-care (exercise, socialize). She is invlved in community work. No big hobbies, but likes to read. Rarely drinks (maybe weekly) and occasional weed. Mother was diagnosed with depression, father and his brother both had depression. Mica's sister is also prone to depression. She takes Lamotrigine for several years. Level of depression is about a 3 of 10. Has some transient anxiety. Sleep and appetite is fine. Does mindfulness and meditation practices.           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 She is also experiencing unresolved grief related to numerous losses and is seeking to work through those feelings. She also reports very low self-esteem. Will explore origins of this esteem issue and develop strategies to resolve/manage. Goal date 3-25.   Marcelina Morel, PhD  11:35a-12:30p 55 minutes

## 2022-10-08 DIAGNOSIS — M65332 Trigger finger, left middle finger: Secondary | ICD-10-CM | POA: Diagnosis not present

## 2022-10-08 DIAGNOSIS — M65331 Trigger finger, right middle finger: Secondary | ICD-10-CM | POA: Diagnosis not present

## 2022-10-15 NOTE — Progress Notes (Signed)
62 y.o. G3P2 Married Caucasian female here for annual exam.    Taking Estroven for menopausal symptoms.  Occasional night sweats.  This is manageable.   Taking Lamictal for mild depression and some anxiety.   She does have a follow up with her PCP.   PCP:  Dr. Hal Hope  Patient's last menstrual period was 02/21/2015 (exact date).           Sexually active: Yes.    The current method of family planning is post menopausal status.    Exercising: Yes.     Strength training, cardio, yoga Smoker:  no  Health Maintenance: Pap:  10/03/19 neg: HR HPV neg, 07/29/18 neg History of abnormal Pap:  yes, 11/1988 CIS of cx, s/p Laser conization  MMG:  scheduled for 10/2022, 10/20/21 Breast Density Category C, BI-RADS CAT 1 neg Colonoscopy:  04/10/22 BMD:   04/30/21  Result  osteopenic TDaP:  02/11/16 Gardasil:   no HIV: n/a Hep C: 05/11/17 Neg Screening Labs:  PCP   reports that she has never smoked. She has never used smokeless tobacco. She reports current alcohol use of about 1.0 standard drink of alcohol per week. She reports that she does not use drugs.  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 shoulder   SQUAMOUS CELL CARCINOMA EXCISION  09/2015   Right ear   thumb surgery Left 1975    Current Outpatient Medications  Medication Sig Dispense Refill   B Complex Vitamins (B COMPLEX PO) Take 1 tablet by mouth daily.     Calcium  Carbonate (CALTRATE 600 PO) Take 600 mg by mouth 2 (two) times daily.     cholecalciferol (VITAMIN D) 1000 UNITS tablet Take 1,000 Units by mouth daily.     Coenzyme Q10 (CO Q 10 PO) Take 1 tablet by mouth daily.     Continuous Blood Gluc Sensor (DEXCOM G4 SENSOR) MISC by Does not apply route. G7     Cyanocobalamin (VITAMIN B-12 PO) Take 1 tablet by mouth daily.     fish oil-omega-3 fatty acids 1000 MG capsule Take 2 g by mouth daily.     fluticasone (FLONASE) 50 MCG/ACT nasal spray Place into both nostrils daily.     JARDIANCE 25 MG TABS tablet Take 25 mg by mouth daily.     lamoTRIgine (LAMICTAL) 100 MG tablet Take 100 mg by mouth 2 (two) times daily.     LANTUS SOLOSTAR 100 UNIT/ML Solostar Pen Inject 6 Units into the skin 2 (two) times daily.     loratadine (CLARITIN) 10 MG tablet Take 10 mg by mouth daily.     MAGNESIUM PO Take 1 tablet by mouth daily.     montelukast (SINGULAIR) 10 MG tablet  Take 10 mg by mouth at bedtime.     Multiple Vitamins-Minerals (MULTIVITAMIN WITH MINERALS) tablet Take 1 tablet by mouth daily.     NOVOLOG 100 UNIT/ML injection 100u/day Graham via pump for 90 days     Probiotic Product (PROBIOTIC DAILY PO) Take 1 tablet by mouth daily.     rosuvastatin (CRESTOR) 5 MG tablet Take 5 mg by mouth daily.     thyroid (ARMOUR) 15 MG tablet Take 15 mg by mouth daily. Takes with 60mg      thyroid (ARMOUR) 60 MG tablet Take 60 mg by mouth daily before breakfast. Takes with 15mg      Turmeric 450 MG CAPS Take by mouth 2 (two) times daily.     budesonide-formoterol (SYMBICORT) 160-4.5 MCG/ACT inhaler Inhale 2 puffs into the lungs 2 (two) times daily. (Patient not taking: Reported on 10/29/2022)     No current facility-administered medications for this visit.    Family History  Problem Relation Age of Onset   Hypertension Mother    Breast cancer Mother    Heart disease Father    Hyperlipidemia Father    Stroke Father    Leukemia Brother    Breast cancer Maternal Aunt     Diabetes Son        type 1 in both sons   Diabetes Son    Colon cancer Neg Hx    Stomach cancer Neg Hx    Esophageal cancer Neg Hx    Rectal cancer Neg Hx     Review of Systems  All other systems reviewed and are negative.   Exam:   BP 120/72 (BP Location: Right Arm, Patient Position: Sitting, Cuff Size: Normal)   Pulse 62   Ht 5' 3.75" (1.619 m)   Wt 140 lb (63.5 kg)   LMP 02/21/2015 (Exact Date)   SpO2 98%   BMI 24.22 kg/m     General appearance: alert, cooperative and appears stated age Head: normocephalic, without obvious abnormality, atraumatic Neck: no adenopathy, supple, symmetrical, trachea midline and thyroid normal to inspection and palpation Lungs: clear to auscultation bilaterally Breasts: normal appearance, no masses or tenderness, No nipple retraction or dimpling, No nipple discharge or bleeding, No axillary adenopathy Heart: regular rate and rhythm Abdomen: soft, non-tender; no masses, no organomegaly Extremities: extremities normal, atraumatic, no cyanosis or edema Skin: skin color, texture, turgor normal. No rashes or lesions Lymph nodes: cervical, supraclavicular, and axillary nodes normal. Neurologic: grossly normal  Pelvic: External genitalia:  no lesions              No abnormal inguinal nodes palpated.              Urethra:  normal appearing urethra with no masses, tenderness or lesions              Bartholins and Skenes: normal                 Vagina: normal appearing vagina with normal color and discharge, no lesions              Cervix: no lesions              Pap taken: yes Bimanual Exam:  Uterus:  normal size, contour, position, consistency, mobility, non-tender              Adnexa: no mass, fullness, tenderness              Rectal exam: yes.  Confirms.  Anus:  normal sphincter tone, no lesions  Chaperone was present for exam:  Warren Lacy, CMA  Assessment:   Well woman visit with gynecologic exam. Hx cervical conization for CIS,  1990.  Normal follow up paps. Menopausal female. Osteopenia.   Plan: Mammogram screening discussed. Self breast awareness reviewed. Pap and HR HPV as above. Guidelines for Calcium, Vitamin D, regular exercise program including cardiovascular and weight bearing exercise. BMD at the end of this year. Follow up annually and prn.   After visit summary provided.

## 2022-10-16 DIAGNOSIS — M25511 Pain in right shoulder: Secondary | ICD-10-CM | POA: Diagnosis not present

## 2022-10-16 DIAGNOSIS — M25561 Pain in right knee: Secondary | ICD-10-CM | POA: Diagnosis not present

## 2022-10-16 DIAGNOSIS — S39012D Strain of muscle, fascia and tendon of lower back, subsequent encounter: Secondary | ICD-10-CM | POA: Diagnosis not present

## 2022-10-16 DIAGNOSIS — M542 Cervicalgia: Secondary | ICD-10-CM | POA: Diagnosis not present

## 2022-10-24 DIAGNOSIS — E109 Type 1 diabetes mellitus without complications: Secondary | ICD-10-CM | POA: Diagnosis not present

## 2022-10-24 DIAGNOSIS — Z794 Long term (current) use of insulin: Secondary | ICD-10-CM | POA: Diagnosis not present

## 2022-10-26 DIAGNOSIS — E039 Hypothyroidism, unspecified: Secondary | ICD-10-CM | POA: Diagnosis not present

## 2022-10-26 DIAGNOSIS — E785 Hyperlipidemia, unspecified: Secondary | ICD-10-CM | POA: Diagnosis not present

## 2022-10-26 DIAGNOSIS — F411 Generalized anxiety disorder: Secondary | ICD-10-CM | POA: Diagnosis not present

## 2022-10-26 DIAGNOSIS — F39 Unspecified mood [affective] disorder: Secondary | ICD-10-CM | POA: Diagnosis not present

## 2022-10-27 ENCOUNTER — Other Ambulatory Visit: Payer: Self-pay | Admitting: Obstetrics and Gynecology

## 2022-10-27 DIAGNOSIS — Z1231 Encounter for screening mammogram for malignant neoplasm of breast: Secondary | ICD-10-CM

## 2022-10-28 ENCOUNTER — Ambulatory Visit: Payer: BC Managed Care – PPO | Admitting: Psychology

## 2022-10-28 DIAGNOSIS — F4323 Adjustment disorder with mixed anxiety and depressed mood: Secondary | ICD-10-CM | POA: Diagnosis not present

## 2022-10-28 NOTE — Progress Notes (Signed)
[facility] Counselor Initial Adult Exam  Name: Alden Feagan [facility] Date: 10/28/2022 MRN: [id number] DOB: 11/10/1960 PCP: Dois Davenport, MD    Guardian/Payee:  N/A    Paperwork requested: No   Reason for Visit /Presenting Problem: mild depression and anxiety, grief, poor self-esteem  Mental Status Exam: Appearance:   Casual     Behavior:  Appropriate  Motor:  Normal  Speech/Language:   Clear and Coherent  Affect:  Appropriate  Mood:  normal  Thought process:  normal  Thought content:    WNL  Sensory/Perceptual disturbances:    WNL  Orientation:  oriented to person, place, and situation  Attention:  Good  Concentration:  Good  Memory:  WNL  Fund of knowledge:   Good  Insight:    Good  Judgment:   Good  Impulse Control:  Good     Reported Symptoms:  guilt, grief (sadness), depleted sense of self  Risk Assessment: Danger to Self:  No Self-injurious Behavior: No Danger to Others: No Duty to Warn:no Physical Aggression / Violence:No  Access to Firearms a concern: No  Gang Involvement:No  Patient / guardian was educated about steps to take if suicide or homicide risk level increases between visits: n/a While future psychiatric events cannot be accurately predicted, the patient does not currently require acute inpatient psychiatric care and does not currently meet [facility] involuntary commitment criteria.  Substance Abuse History: Current substance abuse: No     Past Psychiatric History:   No previous psychological problems have been observed Outpatient Providers:N/A History of Psych Hospitalization: No  Psychological Testing:  N/A    Abuse History:  Victim of: No.,  N/A    Report needed: No. Victim of Neglect:No. Perpetrator of  N/A   Witness / Exposure to Domestic Violence: No   Protective Services Involvement: No  Witness to [employer] Violence:  No   Family History:   Family History  Problem Relation Age of Onset   Hypertension Mother    Breast cancer Mother    Heart disease Father    Hyperlipidemia Father    Stroke Father    Leukemia Brother    Breast cancer Maternal Aunt    Diabetes Son        type 1 in both sons   Diabetes Son    Colon cancer Neg Hx    Stomach cancer Neg Hx    Esophageal cancer Neg Hx    Rectal cancer Neg Hx     Living situation: the patient lives with their spouse  Sexual Orientation: Straight  Relationship Status: married  Name of spouse / other:[name] If a parent, number of children / ages:Two boys, ages 62 and 45  Support Systems: spouse  Surveyor, quantity Stress:  No   Income/Employment/Disability: Employment  Financial planner: No   Educational History: Education:  unknown  Oncologist: unknown  Any cultural differences that may affect / interfere with treatment:  not applicable   Recreation/Hobbies: reading  Stressors: Marital or family conflict    Strengths: Supportive Relationships  Barriers:  undetermined   Legal History: Pending legal issue / charges: The patient has no significant history of legal issues. 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 shoulder   SQUAMOUS CELL CARCINOMA EXCISION  09/2015   Right ear   thumb  surgery Left 1975    Medications: Current Outpatient Medications  Medication Sig Dispense Refill   B Complex Vitamins (B COMPLEX PO) Take 1 tablet by mouth daily.     budesonide-formoterol (SYMBICORT) 160-4.5 MCG/ACT inhaler Inhale 2 puffs into the lungs 2 (two) times daily.     cholecalciferol (VITAMIN D) 1000 UNITS tablet Take 1,000 Units by mouth daily.     Coenzyme Q10 (CO Q 10 PO) Take 1 tablet by mouth daily.     Continuous Blood Gluc Sensor (DEXCOM G4 SENSOR) MISC by Does not apply route. (Patient not taking: Reported on 04/10/2022)     Cyanocobalamin (VITAMIN B-12 PO) Take 1 tablet by mouth daily.     fish oil-omega-3 fatty acids 1000 MG capsule Take 2 g by mouth daily.     fluticasone (FLONASE) 50 MCG/ACT nasal spray Place into both nostrils daily.     HUMALOG JUNIOR KWIKPEN 100 UNIT/ML KwikPen Junior Inject 2 Units into the skin as needed.     JARDIANCE 25 MG TABS tablet Take 25 mg by mouth daily.     lamoTRIgine (LAMICTAL) 100 MG tablet Take 100 mg by mouth 2 (two) times daily.     LANTUS SOLOSTAR 100 UNIT/ML Solostar Pen Inject 6 Units into the skin 2 (two) times daily.     loratadine (CLARITIN) 10 MG tablet Take 10 mg by mouth daily.     MAGNESIUM PO Take 1 tablet by mouth daily.     montelukast (SINGULAIR) 10 MG tablet Take 10 mg by mouth at bedtime.     Multiple Vitamins-Minerals (MULTIVITAMIN WITH MINERALS) tablet Take 1 tablet by mouth daily.     Probiotic Product (PROBIOTIC DAILY PO) Take 1 tablet by mouth daily.     rosuvastatin (CRESTOR) 5 MG tablet Take 5 mg by mouth daily.     thyroid (ARMOUR) 15 MG tablet Take 15 mg by mouth daily. Takes with 60mg      thyroid (ARMOUR) 60 MG tablet Take 60 mg by mouth daily before breakfast. Takes with 15mg      Turmeric 450 MG CAPS Take by mouth 2 (two) times daily.     No current facility-administered medications for this visit.    Allergies  Allergen Reactions   Lisinopril Other (See Comments) and Cough    cough   Patient  was seen face to face in provider's office  Initial session note: Patient is the caregiver of her mother who has dementia. She has issues of grief separation and abandonment. Recent diagnosis of type 1 diabetic and insulin dependent. Her sons are diabetic as well. Most recently consumed with the news that her father's wife is terminally ill.  Father died a few years ago, and Alice had a lot of anger and resentment toward father's wife. She is angry, bitter and hurt toward this woman, who also treated [employer] poorly. Says her father's closest friends also had issues with his wife. Kaydin knew this woman for almost 30 years and they are very different. They did not really connect well from the start. This woman was very controlling. Jonasia says "I don't like people to dislike me and her step mother did not like her. Marylon feels guilty and fears that family members have a bad perception of her because of her father's wife. In particular, her deceased brother's family had a good relationship with her step-mother because they lived so far away [facility]).  Her parents divorced in 80 when Tya was 79. They had separated twice before. It was a bitter divorce and mother did not want the divorce. Emmalyne has 1 deceased brother who was 3 years older and a sister 4 years younger (in [place], [place]). Brother died of complications from bone marrow transplant. Her mother in law was diagnosed with cancer around same time and passed away. Her brother was best friends with her husband and they worked together. Her husband, father and brother worked in their own business together. Her husband  Judie Grieve) now owns the business with [employer]. 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 Shailah and Judie Grieve married 33 years and together 8 before that. They have 2 boys. (Dylan age 25 and [place] age 105). Both live in [place]. Valentina Lucks lives with girlfriend. Both boys have diabetes. Her marital relationship is "up and down". Relationship with  mother: "She was not a nurturing person". Norma became very independent when her parents divorced. She wonders if mother is on spectrum. She says mom was quirky, especially socially. Mom was not affectionate and they did not share interests. Mom had lots of friends, but not with a lot of depth.  Her "grief" hits her "really hard". Lots of loss in her life. Uncle committed suicide, brother's death and mother in law's death. Aamirah gets very sad with separation and thinks it was because of parents separations and divorce. When her mother went away to teach, it was devastating for Mikia and felt that maybe she was to blame.  Her weekly routine: Says she has backed off because they now "butt heads". So goes to see her at [facility] once a week and drives her to appointments. She now goes out to visit to help her do laundry and other tasks.  She states that depression runs in her family. She tries to focus on self-care (exercise, socialize). She is invlved in community work. No big hobbies, but likes to read. Rarely drinks (maybe weekly) and occasional weed. Mother was diagnosed with depression, father and his brother both had depression. Breuna's sister is also prone to depression. She takes Lamotrigine for several years. Level of depression is about a 3 of 10. Has some transient anxiety. Sleep and appetite is fine. Does mindfulness and meditation practices.           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 She is also experiencing unresolved grief related to numerous losses and is seeking to work through those feelings. She also reports very low self-esteem. Will explore origins of this esteem issue and develop strategies to resolve/manage. Goal  date 3-25. Patient was seen in  provider's office for session  Session Note: She has been thinking about the evolution of the relationship with her step-mother. She feels betrayed by her and feels she was not an ethical person. Lakynn avoids conflict and wants to let go of feeling like a victim. She says that her step mother "did me and my sister wrong" over the years. At one point she called her and her sister to her house (while her father was at [facility]) and proposed  that Selina and her sister take responsibility of managing four of her father's properties, but run all of the money through her. She wanted the money and the "final say" on all decisions. She was trying to take advantage of Marielouise and her sister in this situation. Erum said that when her step mother saw father's will she was upset because she would get a very small part of the estate. She had it changed to take out some of the properties and put them as part of her inheritance. Alaila says that her step mother was not honest with her and that she greedy. Khira says that she cannot find her voice and thinks she would like to ask her children about their mother's will. Dellamae says she lacks confidence and esteem, which goes back to early children. She was shy and witnessed lots of conflict between parents. Father had a temper and would lash out and she learned to stay quiet. Her inhibitions have stayed with her for a lifetime. This is why she struggles with talking to her step mother's kids about the estate of their mother.   I working on self care and will continue. Careful with nutrition and is practicing meditation.     Garrel Ridgel, PhD  11:40a-12:30p 50 minutes

## 2022-10-29 ENCOUNTER — Encounter: Payer: Self-pay | Admitting: Obstetrics and Gynecology

## 2022-10-29 ENCOUNTER — Other Ambulatory Visit (HOSPITAL_COMMUNITY)
Admission: RE | Admit: 2022-10-29 | Discharge: 2022-10-29 | Disposition: A | Discharge status: Home / Self Care | Payer: BC Managed Care – PPO | Source: Ambulatory Visit | Attending: Obstetrics and Gynecology | Admitting: Obstetrics and Gynecology

## 2022-10-29 ENCOUNTER — Ambulatory Visit: Payer: BC Managed Care – PPO | Admitting: Obstetrics and Gynecology

## 2022-10-29 VITALS — BP 120/72 | HR 62 | Ht 63.75 in | Wt 140.0 lb

## 2022-10-29 DIAGNOSIS — Z124 Encounter for screening for malignant neoplasm of cervix: Secondary | ICD-10-CM | POA: Diagnosis not present

## 2022-10-29 DIAGNOSIS — Z01419 Encounter for gynecological examination (general) (routine) without abnormal findings: Secondary | ICD-10-CM

## 2022-10-29 DIAGNOSIS — Z780 Asymptomatic menopausal state: Secondary | ICD-10-CM

## 2022-10-29 DIAGNOSIS — M8580 Other specified disorders of bone density and structure, unspecified site: Secondary | ICD-10-CM | POA: Diagnosis not present

## 2022-10-29 NOTE — Patient Instructions (Addendum)
You may consider treatment of menopausal symptoms with the following:  Gabapentin, Effexor, or Paxil.   EXERCISE AND DIET:  We recommended that you start or continue a regular exercise program for good health. Regular exercise means any activity that makes your heart beat faster and makes you sweat.  We recommend exercising at least 30 minutes per day at least 3 days a week, preferably 4 or 5.  We also recommend a diet low in fat and sugar.  Inactivity, poor dietary choices and obesity can cause diabetes, heart attack, stroke, and kidney damage, among others.    ALCOHOL AND SMOKING:  Women should limit their alcohol intake to no more than 7 drinks/beers/glasses of wine (combined, not each!) per week. Moderation of alcohol intake to this level decreases your risk of breast cancer and liver damage. And of course, no recreational drugs are part of a healthy lifestyle.  And absolutely no smoking or even second hand smoke. Most people know smoking can cause heart and lung diseases, but did you know it also contributes to weakening of your bones? Aging of your skin?  Yellowing of your teeth and nails?  CALCIUM AND VITAMIN D:  Adequate intake of calcium and Vitamin D are recommended.  The recommendations for exact amounts of these supplements seem to change often, but generally speaking 600 mg of calcium (either carbonate or citrate) and 800 units of Vitamin D per day seems prudent. Certain women may benefit from higher intake of Vitamin D.  If you are among these women, your doctor will have told you during your visit.    PAP SMEARS:  Pap smears, to check for cervical cancer or precancers,  have traditionally been done yearly, although recent scientific advances have shown that most women can have pap smears less often.  However, every woman still should have a physical exam from her gynecologist every year. It will include a breast check, inspection of the vulva and vagina to check for abnormal growths or skin  changes, a visual exam of the cervix, and then an exam to evaluate the size and shape of the uterus and ovaries.  And after 62 years of age, a rectal exam is indicated to check for rectal cancers. We will also provide age appropriate advice regarding health maintenance, like when you should have certain vaccines, screening for sexually transmitted diseases, bone density testing, colonoscopy, mammograms, etc.   MAMMOGRAMS:  All women over 20 years old should have a yearly mammogram. Many facilities now offer a "3D" mammogram, which may cost around $50 extra out of pocket. If possible,  we recommend you accept the option to have the 3D mammogram performed.  It both reduces the number of women who will be called back for extra views which then turn out to be normal, and it is better than the routine mammogram at detecting truly abnormal areas.    COLONOSCOPY:  Colonoscopy to screen for colon cancer is recommended for all women at age 26.  We know, you hate the idea of the prep.  We agree, BUT, having colon cancer and not knowing it is worse!!  Colon cancer so often starts as a polyp that can be seen and removed at colonscopy, which can quite literally save your life!  And if your first colonoscopy is normal and you have no family history of colon cancer, most women don't have to have it again for 10 years.  Once every ten years, you can do something that may end up saving your  life, right?  We will be happy to help you get it scheduled when you are ready.  Be sure to check your insurance coverage so you understand how much it will cost.  It may be covered as a preventative service at no cost, but you should check your particular policy.    Calcium Content in Foods Calcium is the most abundant mineral in the body. Most of the body's calcium supply is stored in bones and teeth. Calcium helps many parts of the body function normally, including: Blood and blood vessels. Nerves. Hormones. Muscles. Bones and  teeth. When your calcium stores are low, you may be at risk for low bone mass, bone loss, and broken bones (fractures). When you get enough calcium, it helps to support strong bones and teeth throughout your life. Calcium is especially important for: Children during growth spurts. Girls during adolescence. Women who are pregnant or breastfeeding. Women after their menstrual cycle stops (postmenopause). Women whose menstrual cycle has stopped due to anorexia nervosa or regular intense exercise. People who cannot eat or digest dairy products. Vegans. Recommended daily amounts of calcium: Women (ages 92 to 22): 1,000 mg per day. Women (ages 41 and older): 1,200 mg per day. Men (ages 64 to 36): 1,000 mg per day. Men (ages 33 and older): 1,200 mg per day. Women (ages 36 to 88): 1,300 mg per day. Men (ages 79 to 10): 1,300 mg per day. General information Eat foods that are high in calcium. Try to get most of your calcium from food. Some people may benefit from taking calcium supplements. Check with your health care provider or diet and nutrition specialist (dietitian) before starting any calcium supplements. Calcium supplements may interact with certain medicines. Too much calcium may cause other health problems, such as constipation and kidney stones. For the body to absorb calcium, it needs vitamin D. Sources of vitamin D include: Skin exposure to direct sunlight. Foods, such as egg yolks, liver, mushrooms, saltwater fish, and fortified milk. Vitamin D supplements. Check with your health care provider or dietitian before starting any vitamin D supplements. What foods are high in calcium?  Foods that are high in calcium contain more than 100 milligrams per serving. Fruits Fortified orange juice or other fruit juice, 300 mg per 8 oz serving. Vegetables Collard greens, 360 mg per 8 oz serving. Kale, 100 mg per 8 oz serving. Bok choy, 160 mg per 8 oz serving. Grains Fortified ready-to-eat  cereals, 100 to 1,000 mg per 8 oz serving. Fortified frozen waffles, 200 mg in 2 waffles. Oatmeal, 140 mg in 1 cup. Meats and other proteins Sardines, canned with bones, 325 mg per 3 oz serving. Salmon, canned with bones, 180 mg per 3 oz serving. Canned shrimp, 125 mg per 3 oz serving. Baked beans, 160 mg per 4 oz serving. Tofu, firm, made with calcium sulfate, 253 mg per 4 oz serving. Dairy Yogurt, plain, low-fat, 310 mg per 6 oz serving. Nonfat milk, 300 mg per 8 oz serving. American cheese, 195 mg per 1 oz serving. Cheddar cheese, 205 mg per 1 oz serving. Cottage cheese 2%, 105 mg per 4 oz serving. Fortified soy, rice, or almond milk, 300 mg per 8 oz serving. Mozzarella, part skim, 210 mg per 1 oz serving. The items listed above may not be a complete list of foods high in calcium. Actual amounts of calcium may be different depending on processing. Contact a dietitian for more information. What foods are lower in calcium? Foods that are lower  in calcium contain 50 mg or less per serving. Fruits Apple, about 6 mg. Banana, about 12 mg. Vegetables Lettuce, 19 mg per 2 oz serving. Tomato, about 11 mg. Grains Rice, 4 mg per 6 oz serving. Boiled potatoes, 14 mg per 8 oz serving. White bread, 6 mg per slice. Meats and other proteins Egg, 27 mg per 2 oz serving. Red meat, 7 mg per 4 oz serving. Chicken, 17 mg per 4 oz serving. Fish, cod, or trout, 20 mg per 4 oz serving. Dairy Cream cheese, regular, 14 mg per 1 Tbsp serving. Brie cheese, 50 mg per 1 oz serving. Parmesan cheese, 70 mg per 1 Tbsp serving. The items listed above may not be a complete list of foods lower in calcium. Actual amounts of calcium may be different depending on processing. Contact a dietitian for more information. Summary Calcium is an important mineral in the body because it affects many functions. Getting enough calcium helps support strong bones and teeth throughout your life. Try to get most of your  calcium from food. Calcium supplements may interact with certain medicines. Check with your health care provider or dietitian before starting any calcium supplements. This information is not intended to replace advice given to you by your health care provider. Make sure you discuss any questions you have with your health care provider. Document Revised: 10/25/2019 Document Reviewed: 10/25/2019 Elsevier Patient Education  2023 [employer].

## 2022-11-03 ENCOUNTER — Encounter: Payer: Self-pay | Admitting: Obstetrics and Gynecology

## 2022-11-03 DIAGNOSIS — M542 Cervicalgia: Secondary | ICD-10-CM | POA: Diagnosis not present

## 2022-11-03 DIAGNOSIS — M25561 Pain in right knee: Secondary | ICD-10-CM | POA: Diagnosis not present

## 2022-11-03 DIAGNOSIS — S39012D Strain of muscle, fascia and tendon of lower back, subsequent encounter: Secondary | ICD-10-CM | POA: Diagnosis not present

## 2022-11-03 DIAGNOSIS — M25511 Pain in right shoulder: Secondary | ICD-10-CM | POA: Diagnosis not present

## 2022-11-03 LAB — CYTOLOGY - PAP
Comment: NEGATIVE
Diagnosis: UNDETERMINED — AB
High risk HPV: NEGATIVE

## 2022-11-04 DIAGNOSIS — M67911 Unspecified disorder of synovium and tendon, right shoulder: Secondary | ICD-10-CM | POA: Diagnosis not present

## 2022-11-09 ENCOUNTER — Ambulatory Visit: Payer: BC Managed Care – PPO | Admitting: Psychology

## 2022-11-16 ENCOUNTER — Ambulatory Visit: Payer: BC Managed Care – PPO | Admitting: Psychology

## 2022-11-16 DIAGNOSIS — F4323 Adjustment disorder with mixed anxiety and depressed mood: Secondary | ICD-10-CM | POA: Diagnosis not present

## 2022-11-16 NOTE — Progress Notes (Signed)
[facility] Counselor Initial Adult Exam  Name: Megan Cain Date: 11/16/2022 MRN: [id number] DOB: 1960/10/15 PCP: Dois Davenport, MD    Guardian/Payee:  N/A    Paperwork requested: No   Reason for Visit /Presenting Problem: mild depression and anxiety, grief, poor self-esteem  Mental Status Exam: Appearance:   Casual     Behavior:  Appropriate  Motor:  Normal  Speech/Language:   Clear and Coherent  Affect:  Appropriate  Mood:  normal  Thought process:  normal  Thought content:    WNL  Sensory/Perceptual disturbances:    WNL  Orientation:  oriented to person, place, and situation  Attention:  Good  Concentration:  Good  Memory:  WNL  Fund of knowledge:   Good  Insight:    Good  Judgment:   Good  Impulse Control:  Good     Reported Symptoms:  guilt, grief (sadness), depleted sense of self  Risk Assessment: Danger to Self:  No Self-injurious Behavior: No Danger to Others: No Duty to Warn:no Physical Aggression / Violence:No  Access to Firearms a concern: No  Gang Involvement:No  Patient / guardian was educated about steps to take if suicide or homicide risk level increases between visits: n/a While future psychiatric events cannot be accurately predicted, the patient does not currently require acute inpatient psychiatric care and does not currently meet [facility] involuntary commitment criteria.  Substance Abuse History: Current substance abuse: No     Past Psychiatric History:   No previous psychological problems have been observed Outpatient Providers:N/A History of Psych Hospitalization: No  Psychological Testing:  N/A    Abuse History:  Victim of: No.,  N/A    Report needed: No. Victim of Neglect:No. Perpetrator of  N/A   Witness / Exposure to Domestic Violence: No   Protective Services Involvement: No  Witness to [employer] Violence:   No   Family History:  Family History  Problem Relation Age of Onset   Hypertension Mother    Breast cancer Mother    Heart disease Father    Hyperlipidemia Father    Stroke Father    Leukemia Brother    Breast cancer Maternal Aunt    Diabetes Son        type 1 in both sons   Diabetes Son    Colon cancer Neg Hx    Stomach cancer Neg Hx    Esophageal cancer Neg Hx    Rectal cancer Neg Hx     Living situation: the patient lives with their spouse  Sexual Orientation: Straight  Relationship Status: married  Name of spouse / other:[name] If a parent, number of children / ages:Two boys, ages 68 and 24  Support Systems: spouse  Surveyor, quantity Stress:  No   Income/Employment/Disability: Employment  Financial planner: No   Educational History: Education:  unknown  Oncologist: unknown  Any cultural differences that may affect / interfere with treatment:  not applicable   Recreation/Hobbies: reading  Stressors: Marital or family conflict    Strengths: Supportive Relationships  Barriers:  undetermined   Legal History: Pending legal issue / charges: The patient has no significant history of legal issues. 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 shoulder   SQUAMOUS CELL CARCINOMA EXCISION  09/2015    Right ear   thumb surgery Left 1975    Medications: Current Outpatient Medications  Medication Sig Dispense Refill   B Complex Vitamins (B COMPLEX PO) Take 1 tablet by mouth daily.     budesonide-formoterol (SYMBICORT) 160-4.5 MCG/ACT inhaler Inhale 2 puffs into the lungs 2 (two) times daily. (Patient not taking: Reported on 10/29/2022)     Calcium Carbonate (CALTRATE 600 PO) Take 600 mg by mouth 2 (two) times daily.     cholecalciferol (VITAMIN D) 1000 UNITS tablet Take 1,000 Units by mouth daily.     Coenzyme Q10 (CO Q 10 PO) Take 1 tablet by mouth daily.     Continuous Blood Gluc Sensor (DEXCOM G4 SENSOR) MISC by Does not apply route. G7     Cyanocobalamin (VITAMIN B-12 PO) Take 1 tablet by mouth daily.     fish oil-omega-3 fatty acids 1000 MG capsule Take 2 g by mouth daily.     fluticasone (FLONASE) 50 MCG/ACT nasal spray Place into both nostrils daily.     JARDIANCE 25 MG TABS tablet Take 25 mg by mouth daily.     lamoTRIgine (LAMICTAL) 100 MG tablet Take 100 mg by mouth 2 (two) times daily.     LANTUS SOLOSTAR 100 UNIT/ML Solostar Pen Inject 6 Units into the skin 2 (two) times daily.     loratadine (CLARITIN) 10 MG tablet Take 10 mg by mouth daily.     MAGNESIUM PO Take 1 tablet by mouth daily.     montelukast (SINGULAIR) 10 MG tablet Take 10 mg by mouth at bedtime.     Multiple Vitamins-Minerals (MULTIVITAMIN WITH MINERALS) tablet Take 1 tablet by mouth daily.     NOVOLOG 100 UNIT/ML injection 100u/day [place] via pump for 90 days     Probiotic Product (PROBIOTIC DAILY PO) Take 1 tablet by mouth daily.     rosuvastatin (CRESTOR) 5 MG tablet Take 5 mg by mouth daily.     thyroid (ARMOUR) 15 MG tablet Take 15 mg by mouth daily. Takes with 60mg      thyroid (ARMOUR) 60 MG tablet Take 60 mg by mouth daily before breakfast. Takes with 15mg      Turmeric 450 MG CAPS Take by mouth 2 (two) times daily.     No current facility-administered medications for this visit.    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 She has issues  of grief separation and abandonment. Recent diagnosis of type 1 diabetic and insulin dependent. Her sons are diabetic as well. Most recently consumed with the news that her father's wife is terminally ill. Father died a few years ago, and Megan Cain had a lot of anger and resentment toward father's wife. She is angry, bitter and hurt toward this woman, who also treated Megan Cain poorly. Says her father's closest friends also had issues with his wife. Megan Cain knew this woman for almost 30 years and they are very different. They did not really connect well from the start. This woman was very controlling. Megan Cain says "I don't like people to dislike me and her step mother did not like her. Megan Cain feels guilty and fears that family members have a bad perception of her because of her father's wife. In particular, her deceased brother's family had a good relationship with her step-mother because they lived so far away [facility]).  Her parents divorced in 40 when Citlalic was 45. They had separated twice before. It was a bitter divorce and mother did not want the divorce. Raheema has 1 deceased brother who was 3 years older and a sister 4 years younger (in [place], [place]). Brother died of complications from bone marrow transplant. Her mother in law was diagnosed with cancer around same time and passed away. Her brother was best friends with her husband and they worked together. Her husband, father and brother worked in their own business together. 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 Shantrell and Megan Cain married 33 years and together 8 before that. They have 2 boys. (Megan Cain age 58 and Megan Cain age 54). Both live in [place]. Megan Cain lives with girlfriend. Both  boys have diabetes. Her marital relationship is "up and down". Relationship with mother: "She was not a nurturing person". Cleotilde became very independent when her parents divorced. She wonders if mother is on spectrum. She says mom was quirky, especially socially. Mom was not affectionate and they did not share interests. Mom had lots of friends, but not with a lot of depth.  Her "grief" hits her "really hard". Lots of loss in her life. Uncle committed suicide, brother's death and mother in law's death. Bergen gets very sad with separation and thinks it was because of parents separations and divorce. When her mother went away to teach, it was devastating for Megan Cain and felt that maybe she was to blame.  Her weekly routine: Says she has backed off because they now "butt heads". So goes to see her at [facility] once a week and drives her to appointments. She now goes out to visit to help her do laundry and other tasks.  She states that depression runs in her family. She tries to focus on self-care (exercise, socialize). She is invlved in community work. No big hobbies, but likes to read. Rarely drinks (maybe weekly) and occasional weed. Mother was diagnosed with depression, father and his brother both had depression. Madell's sister is also prone to depression. She takes Lamotrigine for several years. Level of depression is about a 3 of 10. Has some transient anxiety. Sleep and appetite is fine. Does mindfulness and meditation practices.           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 She is also experiencing  unresolved grief related to numerous losses and is seeking to work through those feelings. She also reports very low self-esteem. Will explore origins of this esteem issue  and develop strategies to resolve/manage. Goal date 3-25. Patient was seen via video Public affairs consultant) platform. She was at home and provider in his home office.  Session Note: We discussed that she sometimes struggles with understanding what she needs and wants. This adds to the difficulty asserting herself. When she grew up, she saw anger and hostility rather than resolution. She did decided to write a note to her step-mother's family (4 of them) to say she was sorry for their loss and she made a donation in their mother's name. She saw her dad's best friend who was packing her car with some of her dad's possessions. She called the wife of her father's best friend who was also a friend of his wife (her stepmother), and found out about what is going to happen to her father's art-work. Much of the collection was auctioned and the money went to her estate. She had previously sent her a letter telling her that some of the art had a special meaning to her and would she  give them to her at her passing. Her step mother said "of course", but never did. Solangel is grieving and sad and angry that her step-mother made this choice.  Her nephew contacted her to see if he could stay with her when he comes for her step-mother's service. Zareya is struggling with whether to go and she decided that she will attend. The service will be May 18th.        Megan Ridgel, PhD  10:40a-11:30a 50 minutes

## 2022-11-23 ENCOUNTER — Ambulatory Visit: Payer: BC Managed Care – PPO | Admitting: Psychology

## 2022-11-23 DIAGNOSIS — F4323 Adjustment disorder with mixed anxiety and depressed mood: Secondary | ICD-10-CM

## 2022-11-23 NOTE — Progress Notes (Signed)
Megan Cain Counselor Initial Adult Exam  Name: Megan Cain [facility] Date: 11/23/2022 MRN: [id number] DOB: [birth year] PCP: Megan Davenport, MD    Guardian/Payee:  N/A    Paperwork requested: No   Reason for Visit /Presenting Problem: mild depression and anxiety, grief, poor self-esteem  Mental Status Exam: Appearance:   Casual     Behavior:  Appropriate  Motor:  Normal  Speech/Language:   Clear and Coherent  Affect:  Appropriate  Mood:  normal  Thought process:  normal  Thought content:    WNL  Sensory/Perceptual disturbances:    WNL  Orientation:  oriented to person, place, and situation  Attention:  Good  Concentration:  Good  Memory:  WNL  Fund of knowledge:   Good  Insight:    Good  Judgment:   Good  Impulse Control:  Good     Reported Symptoms:  guilt, grief (sadness), depleted sense of self  Risk Assessment: Danger to Self:  No Self-injurious Behavior: No Danger to Others: No Duty to Warn:no Physical Aggression / Violence:No  Access to Firearms a concern: No  Gang Involvement:No  Patient / guardian was educated about steps to take if suicide or homicide risk level increases between visits: n/a While future psychiatric events cannot be accurately predicted, the patient does not currently require acute inpatient psychiatric care and does not currently meet [facility] involuntary commitment criteria.  Substance Abuse History: Current substance abuse: No     Past Psychiatric History:   No previous psychological problems have been observed Outpatient Providers:N/A History of Psych Hospitalization: No  Psychological Testing:  N/A    Abuse History:  Victim of: No.,  N/A    Report needed: No. Victim of Neglect:No. Perpetrator of  N/A   Witness / Exposure to Domestic Violence: No   Protective Services Involvement: No   Witness to [employer] Violence:  No   Family History:  Family History  Problem Relation Age of Onset   Hypertension Mother    Breast cancer Mother    Heart disease Father    Hyperlipidemia Father    Stroke Father    Leukemia Brother    Breast cancer Maternal Aunt    Diabetes Son        type 1 in both sons   Diabetes Son    Colon cancer Neg Hx    Stomach cancer Neg Hx    Esophageal cancer Neg Hx    Rectal cancer Neg Hx     Living situation: the patient lives with their spouse  Sexual Orientation: Straight  Relationship Status: married  Name of spouse / other:Megan Cain If a parent, number of children / ages:Two boys, ages 74 and 53  Support Systems: spouse  Surveyor, quantity Stress:  No   Income/Employment/Disability: Employment  Financial planner: No   Educational History: Education:  unknown  Oncologist: unknown  Any cultural differences that may affect / interfere with treatment:  not applicable   Recreation/Hobbies: reading  Stressors: Marital or family conflict    Strengths: Supportive Relationships  Barriers:  undetermined   Legal History: Pending legal issue / charges: The patient has no significant history  of legal issues. 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 shoulder   SQUAMOUS  CELL CARCINOMA EXCISION  09/2015   Right ear   thumb surgery Left 1975    Medications: Current Outpatient Medications  Medication Sig Dispense Refill   B Complex Vitamins (B COMPLEX PO) Take 1 tablet by mouth daily.     budesonide-formoterol (SYMBICORT) 160-4.5 MCG/ACT inhaler Inhale 2 puffs into the lungs 2 (two) times daily. (Patient not taking: Reported on 10/29/2022)     Calcium Carbonate (CALTRATE 600 PO) Take 600 mg by mouth 2 (two) times daily.     cholecalciferol (VITAMIN D) 1000 UNITS tablet Take 1,000 Units by mouth daily.     Coenzyme Q10 (CO Q 10 PO) Take 1 tablet by mouth daily.     Continuous Blood Gluc Sensor (DEXCOM G4 SENSOR) MISC by Does not apply route. G7     Cyanocobalamin (VITAMIN B-12 PO) Take 1 tablet by mouth daily.     fish oil-omega-3 fatty acids 1000 MG capsule Take 2 g by mouth daily.     fluticasone (FLONASE) 50 MCG/ACT nasal spray Place into both nostrils daily.     JARDIANCE 25 MG TABS tablet Take 25 mg by mouth daily.     lamoTRIgine (LAMICTAL) 100 MG tablet Take 100 mg by mouth 2 (two) times daily.     LANTUS SOLOSTAR 100 UNIT/ML Solostar Pen Inject 6 Units into the skin 2 (two) times daily.     loratadine (CLARITIN) 10 MG tablet Take 10 mg by mouth daily.     MAGNESIUM PO Take 1 tablet by mouth daily.     montelukast (SINGULAIR) 10 MG tablet Take 10 mg by mouth at bedtime.     Multiple Vitamins-Minerals (MULTIVITAMIN WITH MINERALS) tablet Take 1 tablet by mouth daily.     NOVOLOG 100 UNIT/ML injection 100u/day [place] via pump for 90 days     Probiotic Product (PROBIOTIC DAILY PO) Take 1 tablet by mouth daily.     rosuvastatin (CRESTOR) 5 MG tablet Take 5 mg by mouth daily.     thyroid (ARMOUR) 15 MG tablet Take 15 mg by mouth daily. Takes with 60mg      thyroid (ARMOUR) 60 MG tablet Take 60 mg by mouth daily before breakfast. Takes with 15mg      Turmeric 450 MG CAPS Take by mouth 2 (two) times daily.     No current facility-administered medications for this  visit.    Allergies  Allergen Reactions   Lisinopril Other (See Comments) and Cough    cough   Patient was seen face to face in provider's office  Initial  session note: Patient is the caregiver of her mother who has dementia. She has issues of grief separation and abandonment. Recent diagnosis of type 1 diabetic and insulin dependent. Her sons are diabetic as well. Most recently consumed with the news that her father's wife is terminally ill. Father died a few years ago, and Ginnifer had a lot of anger and resentment toward father's wife. She is angry, bitter and hurt toward this woman, who also treated [employer] poorly. Says her father's closest friends also had issues with his wife. Kysha knew this woman for almost 30 years and they are very different. They did not really connect well from the start. This woman was very controlling. Arielly says "I don't like people to dislike me and her step mother did not like her. Kjerstin feels guilty and fears that family members have a bad perception of her because of her father's wife. In particular, her deceased brother's family had a good relationship with her step-mother because they lived so far away [facility]).  Her parents divorced in 12 when Elnora was 46. They had separated twice before. It was a bitter divorce and mother did not want the divorce. Shawnie has 1 deceased brother who was 3 years older and a sister 4 years younger (in [place], [place]). Brother died of complications from bone marrow transplant. Her mother in law was diagnosed with cancer around same time and passed away. Her brother was best friends with her husband and they worked together. Her husband, father and brother worked in their own business together. Her husband  Judie Grieve) now owns the business with [employer]. 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 Kopelyn and Judie Grieve married 33 years and together 8 before that. They have 2 boys. (Dylan age 33 and [place] age 31). Both live in [place]. Valentina Lucks  lives with girlfriend. Both boys have diabetes. Her marital relationship is "up and down". Relationship with mother: "She was not a nurturing person". Shanigua became very independent when her parents divorced. She wonders if mother is on spectrum. She says mom was quirky, especially socially. Mom was not affectionate and they did not share interests. Mom had lots of friends, but not with a lot of depth.  Her "grief" hits her "really hard". Lots of loss in her life. Uncle committed suicide, brother's death and mother in law's death. Mckenze gets very sad with separation and thinks it was because of parents separations and divorce. When her mother went away to teach, it was devastating for Brynly and felt that maybe she was to blame.  Her weekly routine: Says she has backed off because they now "butt heads". So goes to [facility] her at [facility] once a week and drives her to appointments. She now goes out to visit to help her do laundry and other tasks.  She states that depression runs in her family. She tries to focus on self-care (exercise, socialize). She is invlved in community work. No big hobbies, but likes to read. Rarely drinks (maybe weekly) and occasional weed. Mother was diagnosed with depression, father and his brother both had depression. Erdine's sister is also prone to depression. She takes Lamotrigine for several years. Level of depression is about a 3 of 10. Has some transient anxiety. Sleep and appetite is fine. Does mindfulness and meditation practices.           Diagnoses:  Adjustment disorder with anxiety and depressed mood  Plan of Care: outpatient psychotherapy Treatment plan/goals: Patient seeks counseling to help manage emotions related to caring for  aging mother with dementia, with whom she has a complicated history. She is also experiencing unresolved grief related to numerous losses and is seeking to work through those feelings. She also reports very low self-esteem. Will explore  origins of this esteem issue and develop strategies to resolve/manage. Goal date 3-25. Patient was seen via video Public affairs consultant) platform. She was at home and provider in his home office.  Session Note: She says she had a good weekend with family at the beach. Her husband and 2 sons went together for 4 days. Husband's family has a Event organiser at [employer]. She has decided to go to the [employer]. Her husband and kids will go as well, but her sister is not going to go. Her husband has some ambivalence about attending. Leanny wants to go "with an open heart", but have a "plan B". She says that she wants to plan that if she has an opportunity to talk with sister in law and nephew, how she will handle it. We talked about how to have that conversation. She says that she has thought of having 2 of her father's best friends to come over her house for some social time. She likes the idea of being around people who can understand some of the unpleasant experiences she had with her father's wife. We discussed "toward what end" would she invite them over.  Her anxiety symptoms have been stable as has been her depressive symptoms. Will review her experience after the service at next session.            Garrel Ridgel, PhD  11:40a-12:30p 50 minutes

## 2022-12-03 ENCOUNTER — Ambulatory Visit
Admission: RE | Admit: 2022-12-03 | Discharge: 2022-12-03 | Disposition: A | Discharge status: Home / Self Care | Payer: BC Managed Care – PPO | Source: Ambulatory Visit | Attending: Obstetrics and Gynecology | Admitting: Obstetrics and Gynecology

## 2022-12-03 DIAGNOSIS — Z1231 Encounter for screening mammogram for malignant neoplasm of breast: Secondary | ICD-10-CM

## 2022-12-04 ENCOUNTER — Ambulatory Visit: Payer: BC Managed Care – PPO | Admitting: Psychology

## 2022-12-04 DIAGNOSIS — F4323 Adjustment disorder with mixed anxiety and depressed mood: Secondary | ICD-10-CM | POA: Diagnosis not present

## 2022-12-04 NOTE — Progress Notes (Addendum)
[facility] Counselor Initial Adult Exam  Name: Megan Cain Date: 12/04/2022 MRN: [id number] DOB: 09/07/1960 PCP: Dois Davenport, MD    Guardian/Payee:  N/A    Paperwork requested: No   Reason for Visit /Presenting Problem: mild depression and anxiety, grief, poor self-esteem  Mental Status Exam: Appearance:   Casual     Behavior:  Appropriate  Motor:  Normal  Speech/Language:   Clear and Coherent  Affect:  Appropriate  Mood:  normal  Thought process:  normal  Thought content:    WNL  Sensory/Perceptual disturbances:    WNL  Orientation:  oriented to person, place, and situation  Attention:  Good  Concentration:  Good  Memory:  WNL  Fund of knowledge:   Good  Insight:    Good  Judgment:   Good  Impulse Control:  Good     Reported Symptoms:  guilt, grief (sadness), depleted sense of self  Risk Assessment: Danger to Self:  No Self-injurious Behavior: No Danger to Others: No Duty to Warn:no Physical Aggression / Violence:No  Access to Firearms a concern: No  Gang Involvement:No  Patient / guardian was educated about steps to take if suicide or homicide risk level increases between visits: n/a While future psychiatric events cannot be accurately predicted, the patient does not currently require acute inpatient psychiatric care and does not currently meet [facility] involuntary commitment criteria.  Substance Abuse History: Current substance abuse: No     Past Psychiatric History:   No previous psychological problems have been observed Outpatient Providers:N/A History of Psych Hospitalization: No  Psychological Testing:  N/A    Abuse History:  Victim of: No.,  N/A    Report needed: No. Victim of Neglect:No. Perpetrator of  N/A   Witness / Exposure to Domestic Violence: No    Protective Services Involvement: No  Witness to [employer] Violence:  No   Family History:  Family History  Problem Relation Age of Onset   Hypertension Mother    Breast cancer Mother    Heart disease Father    Hyperlipidemia Father    Stroke Father    Leukemia Brother    Breast cancer Maternal Aunt    Diabetes Son        type 1 in both sons   Diabetes Son    Colon cancer Neg Hx    Stomach cancer Neg Hx    Esophageal cancer Neg Hx    Rectal cancer Neg Hx     Living situation: the patient lives with their spouse  Sexual Orientation: Straight  Relationship Status: married  Name of spouse / other:[name] If a parent, number of children / ages:Two boys, ages 78 and 71  Support Systems: spouse  Surveyor, quantity Stress:  No   Income/Employment/Disability: Employment  Financial planner: No   Educational History: Education:  unknown  Oncologist: unknown  Any cultural differences that may affect / interfere with treatment:  not applicable   Recreation/Hobbies: reading  Stressors: Marital or family conflict    Strengths: Supportive Relationships  Barriers:  undetermined  Legal History: Pending legal issue / charges: The patient has no significant history of legal issues. 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 shoulder   SQUAMOUS CELL CARCINOMA EXCISION  09/2015   Right ear   thumb surgery Left 1975    Medications: Current Outpatient Medications  Medication Sig Dispense Refill   B Complex Vitamins (B COMPLEX PO) Take 1 tablet by mouth daily.     budesonide-formoterol (SYMBICORT) 160-4.5 MCG/ACT inhaler Inhale 2 puffs into the lungs 2 (two) times daily. (Patient not taking: Reported on 10/29/2022)     Calcium Carbonate (CALTRATE 600 PO) Take 600 mg by mouth 2 (two) times daily.     cholecalciferol (VITAMIN D) 1000 UNITS tablet Take 1,000 Units by mouth daily.     Coenzyme Q10 (CO Q 10 PO) Take 1 tablet by mouth daily.     Continuous Blood Gluc Sensor (DEXCOM G4 SENSOR) MISC by Does not apply route. G7     Cyanocobalamin (VITAMIN B-12 PO) Take 1 tablet by mouth daily.     fish oil-omega-3 fatty acids 1000 MG capsule Take 2 g by mouth daily.     fluticasone (FLONASE) 50 MCG/ACT nasal spray Place into both nostrils daily.     JARDIANCE 25 MG TABS tablet Take 25 mg by mouth daily.     lamoTRIgine (LAMICTAL) 100 MG tablet Take 100 mg by mouth 2 (two) times daily.     LANTUS SOLOSTAR 100 UNIT/ML Solostar Pen Inject 6 Units into the skin 2 (two) times daily.     loratadine (CLARITIN) 10 MG tablet Take 10 mg by mouth daily.     MAGNESIUM PO Take 1 tablet by mouth daily.     montelukast (SINGULAIR) 10 MG tablet Take 10 mg by mouth at bedtime.     Multiple Vitamins-Minerals (MULTIVITAMIN WITH MINERALS) tablet Take 1 tablet by mouth daily.     NOVOLOG 100 UNIT/ML injection 100u/day [place] via pump for 90 days     Probiotic Product (PROBIOTIC DAILY PO) Take 1 tablet by mouth daily.     rosuvastatin (CRESTOR) 5 MG tablet Take 5 mg by mouth daily.     thyroid (ARMOUR) 15 MG tablet Take 15 mg by mouth daily. Takes with 60mg      thyroid (ARMOUR) 60 MG tablet Take 60 mg by mouth daily before breakfast. Takes with 15mg      Turmeric 450 MG CAPS Take by mouth 2 (two) times daily.     No current  facility-administered medications for this visit.    Allergies  Allergen Reactions   Lisinopril Other (See Comments) and Cough    cough  Initial session note: Patient is the caregiver of her mother who has dementia. She has issues of grief separation and abandonment. Recent diagnosis of type 1 diabetic and insulin dependent. Her sons are diabetic as well. Most recently consumed with the news that her father's wife is terminally ill. Father died a few years ago, and Jazsmine had a lot of anger and resentment toward father's wife. She is angry, bitter and hurt toward this woman, who also treated [employer] poorly. Says her father's closest friends also had issues with his wife. Monque knew this woman for almost 30 years and they are very different. They did not really connect well from the start. This woman was very controlling. Jerita says "I don't like people to dislike me and her step mother did not like her. Jacole feels guilty and fears that family members have a bad perception of her because of her father's wife. In particular, her deceased brother's family had a good relationship with her step-mother because they lived so far away [facility]).  Her parents divorced in 43 when Ameyaa was 64. They had separated twice before. It was a bitter divorce and mother did not want the divorce. Jniya has 1 deceased brother who was 3 years older and a sister 4 years younger (in [place], [place]). Brother died of complications from bone marrow transplant. Her mother in law was diagnosed with cancer around same time and passed away. Her brother was best friends with her husband and they worked together. Her husband, father and brother worked in their own business together. Her husband  Judie Grieve) now owns the business with [employer]. 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 Takoda and Judie Grieve married 33 years and together 8 before that. They have 2 boys. (Dylan age 61 and [place] age 49). Both live in [place]. Valentina Lucks lives with  girlfriend. Both boys have diabetes. Her marital relationship is "up and down". Relationship with mother: "She was not a nurturing person". Mollye became very independent when her parents divorced. She wonders if mother is on spectrum. She says mom was quirky, especially socially. Mom was not affectionate and they did not share interests. Mom had lots of friends, but not with a lot of depth.  Her "grief" hits her "really hard". Lots of loss in her life. Uncle committed suicide, brother's death and mother in law's death. Shelsea gets very sad with separation and thinks it was because of parents separations and divorce. When her mother went away to teach, it was devastating for Shanean and felt that maybe she was to blame.  Her weekly routine: Says she has backed off because they now "butt heads". So goes to see her at [facility] once a week and drives her to appointments. She now goes out to visit to help her do laundry and other tasks.  She states that depression runs in her family. She tries to focus on self-care (exercise, socialize). She is invlved in community work. No big hobbies, but likes to read. Rarely drinks (maybe weekly) and occasional weed. Mother was diagnosed with depression, father and his brother both had depression. Tarisa's sister is also prone to depression. She takes Lamotrigine for several years. Level of depression is about a 3 of 10. Has some transient anxiety. Sleep and appetite is fine. Does mindfulness and meditation practices.           Diagnoses:  Adjustment disorder with anxiety and depressed mood  Plan of Care: outpatient psychotherapy Treatment plan/goals: Patient seeks counseling to help manage emotions related to caring  for aging mother with dementia, with whom she has a complicated history. She is also experiencing unresolved grief related to numerous losses and is seeking to work through those feelings. She also reports very low self-esteem. Will explore origins of  this esteem issue and develop strategies to resolve/manage. Goal date 3-25. Patient agreed to be seen via video Public affairs consultant) platform. She was at home and provider in his home office.  Session Note She went to the memorial service for her step mother and her 2 sons and husband were with her. She reviewed with her sons about the stress with the relationship she had with her step-mother. Calla said that she became extraverted and was talking to a lot of people. The step mother's kids acknowledged her. She was nervous but everything went fine without incident. Her son wanted to know more about the strain in the family relationships. She had a small family gathering with sister in law and nephew, mother and her sons and husband.  She talked about her 25 year old son that is having some significant anxiety that seems related to work. His self-esteem is damaged and he is suffering lack of confidence. She encouraged her son to talk with his boss which he did and reported that it helped. She may want me to provide the name of a counselor for him.  She said that she is still working on coming to terms with what was done with her father's art collection. Since the service, she is feeling good and clear she did the right thing. She has been thinking about her shoulder pain and needs to see an orthopedist and needs an MRI. She is claustrophobic and is "freaked out" about the procedure.  She talked about her relationship with her mother and feels she needs to change her expectations of her. She has strong reactions to her mother and they argue. She wants to resolve their relationship so she does not have regrets when she ultimately passes. Her mother has dementia. We will talk at next session about the best ways to avoid criticism of her mother.                 Garrel Ridgel, PhD  8:40a-9:30a 50 minutes

## 2022-12-12 HISTORY — PX: SEPTOPLASTY: SUR1290

## 2022-12-17 ENCOUNTER — Ambulatory Visit: Payer: BC Managed Care – PPO | Admitting: Psychology

## 2022-12-28 ENCOUNTER — Ambulatory Visit: Payer: BC Managed Care – PPO | Admitting: Psychology

## 2022-12-29 ENCOUNTER — Ambulatory Visit: Payer: BC Managed Care – PPO | Admitting: Psychology

## 2022-12-29 DIAGNOSIS — F4323 Adjustment disorder with mixed anxiety and depressed mood: Secondary | ICD-10-CM

## 2022-12-29 NOTE — Progress Notes (Signed)
[facility] Counselor Initial Adult Exam  Name: Megan Cain [facility] Date: 12/29/2022 MRN: [id number] DOB: 1961-05-05 PCP: Dois Davenport, MD    Guardian/Payee:  N/A    Paperwork requested: No   Reason for Visit /Presenting Problem: mild depression and anxiety, grief, poor self-esteem  Mental Status Exam: Appearance:   Casual     Behavior:  Appropriate  Motor:  Normal  Speech/Language:   Clear and Coherent  Affect:  Appropriate  Mood:  normal  Thought process:  normal  Thought content:    WNL  Sensory/Perceptual disturbances:    WNL  Orientation:  oriented to person, place, and situation  Attention:  Good  Concentration:  Good  Memory:  WNL  Fund of knowledge:   Good  Insight:    Good  Judgment:   Good  Impulse Control:  Good     Reported Symptoms:  guilt, grief (sadness), depleted sense of self  Risk Assessment: Danger to Self:  No Self-injurious Behavior: No Danger to Others: No Duty to Warn:no Physical Aggression / Violence:No  Access to Firearms a concern: No  Gang Involvement:No  Patient / guardian was educated about steps to take if suicide or homicide risk level increases between visits: n/a While future psychiatric events cannot be accurately predicted, the patient does not currently require acute inpatient psychiatric care and does not currently meet [facility] involuntary commitment criteria.  Substance Abuse History: Current substance abuse: No     Past Psychiatric History:   No previous psychological problems have been observed Outpatient Providers:N/A History of Psych Hospitalization: No  Psychological Testing:  N/A    Abuse History:  Victim of: No.,  N/A    Report needed: No. Victim of Neglect:No. Perpetrator of  N/A   Witness / Exposure to  Domestic Violence: No   Protective Services Involvement: No  Witness to [employer] Violence:  No   Family History:  Family History  Problem Relation Age of Onset   Hypertension Mother    Breast cancer Mother    Heart disease Father    Hyperlipidemia Father    Stroke Father    Leukemia Brother    Breast cancer Maternal Aunt    Diabetes Son        type 1 in both sons   Diabetes Son    Colon cancer Neg Hx    Stomach cancer Neg Hx    Esophageal cancer Neg Hx    Rectal cancer Neg Hx     Living situation: the patient lives with their spouse  Sexual Orientation: Straight  Relationship Status: married  Name of spouse / other:[name] If a parent, number of children / ages:Two boys, ages 48 and 43  Support Systems: spouse  Surveyor, quantity Stress:  No   Income/Employment/Disability: Employment  Financial planner: No   Educational History: Education:  unknown  Oncologist: unknown  Any cultural differences Cain may affect / interfere with treatment:  not applicable   Recreation/Hobbies: reading  Stressors: Marital or family conflict    Strengths: Supportive Relationships  Barriers:  undetermined   Legal History: Pending legal issue / charges: The patient has no significant history of legal issues. 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 shoulder   SQUAMOUS CELL CARCINOMA EXCISION  09/2015   Right ear   thumb surgery Left 1975    Medications: Current Outpatient Medications  Medication Sig Dispense Refill   B Complex Vitamins (B COMPLEX PO) Take 1 tablet by mouth daily.     budesonide-formoterol (SYMBICORT) 160-4.5 MCG/ACT inhaler Inhale 2 puffs into the lungs 2 (two) times daily. (Patient not taking: Reported on 10/29/2022)     Calcium Carbonate (CALTRATE 600 PO) Take 600 mg by mouth 2 (two) times daily.     cholecalciferol (VITAMIN D) 1000 UNITS tablet Take 1,000 Units by mouth daily.     Coenzyme Q10 (CO Q 10 PO) Take 1 tablet by mouth daily.     Continuous Blood Gluc Sensor (DEXCOM G4 SENSOR) MISC by Does not apply route. G7     Cyanocobalamin (VITAMIN B-12 PO) Take 1 tablet by mouth daily.     fish oil-omega-3 fatty acids 1000 MG capsule Take 2 g by mouth daily.     fluticasone (FLONASE) 50 MCG/ACT nasal spray Place into both nostrils daily.     JARDIANCE 25 MG TABS tablet Take 25 mg by mouth daily.     lamoTRIgine (LAMICTAL) 100 MG tablet Take 100 mg by mouth 2 (two) times daily.     LANTUS SOLOSTAR 100 UNIT/ML Solostar Pen Inject 6 Units into the skin 2 (two) times daily.     loratadine (CLARITIN) 10 MG tablet Take 10 mg by mouth daily.     MAGNESIUM PO Take 1 tablet by mouth daily.     montelukast (SINGULAIR) 10 MG tablet Take 10 mg by mouth at bedtime.     Multiple Vitamins-Minerals (MULTIVITAMIN WITH MINERALS) tablet Take 1 tablet by mouth daily.     NOVOLOG 100 UNIT/ML injection 100u/day [place] via pump for 90 days     Probiotic Product (PROBIOTIC DAILY PO) Take 1 tablet by mouth daily.     rosuvastatin (CRESTOR) 5 MG tablet Take 5 mg by mouth daily.     thyroid (ARMOUR) 15 MG tablet Take 15 mg by mouth daily. Takes with 60mg      thyroid (ARMOUR) 60 MG tablet Take 60 mg by mouth daily before breakfast. Takes with 15mg      Turmeric 450 MG CAPS Take by mouth 2 (two)  times daily.     No current facility-administered medications for this visit.  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 She has issues of grief separation and abandonment. Recent diagnosis of type 1 diabetic and insulin dependent. Her sons are diabetic as well. Most recently consumed with the news Cain her father's wife is terminally ill. Father died a few years ago, and Megan Cain toward father's wife. She is angry, bitter and hurt toward this woman, who also treated Megan Cain poorly. Says her father's closest friends also had issues with his wife. Megan Cain knew this woman for almost 30 years and they are very different. They did not really connect well from the start. This woman was very controlling. Megan Cain says "I don't like people to dislike me and her step mother did not like her. Megan Cain Cain family members have a bad perception of her because of her father's wife. In particular, her deceased brother's family had a good relationship with her step-mother because they lived so far away [facility]).  Her parents divorced in 45 when Megan Cain was 48. They had separated twice before. It was a bitter divorce and mother did not want the divorce. Megan Cain has 1 deceased brother who was 3 years older and a sister 4 years younger (in [place], [place]). Brother died of complications from bone marrow transplant. Her mother in law was diagnosed with cancer around same time and passed away. Her brother was best friends with her husband and they worked together. Her husband, father and brother worked in their own business together. 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 Aubrei and Megan Cain married 33 years and together 8 before Cain. They have 2 boys. (Megan Cain age 80 and Megan Cain age 37). Both live in  [place]. Megan Cain lives with girlfriend. Both boys have diabetes. Her marital relationship is "up and down". Relationship with mother: "She was not a nurturing person". Megan Cain became very independent when her parents divorced. She wonders if mother is on spectrum. She says mom was quirky, especially socially. Mom was not affectionate and they did not share interests. Mom had lots of friends, but not with a lot of depth.  Her "grief" hits her "really hard". Lots of loss in her life. Uncle committed suicide, brother's death and mother in law's death. Moneka gets very sad with separation and thinks it was because of parents separations and divorce. When her mother went away to teach, it was devastating for Megan Cain and felt Cain maybe she was to blame.  Her weekly routine: Says she has backed off because they now "butt heads". So goes to see her at [facility] once a week and drives her to appointments. She now goes out to visit to help her do laundry and other tasks.  She states Cain depression runs in her family. She tries to focus on self-care (exercise, socialize). She is invlved in community work. No big hobbies, but likes to read. Rarely drinks (maybe weekly) and occasional weed. Mother was diagnosed with depression, father and his brother both had depression. Saleah's sister is also prone to depression. She takes Lamotrigine for several years. Level of depression is about a 3 of 10. Has some transient anxiety. Sleep and appetite is fine. Does mindfulness and meditation practices.           Diagnoses:  Adjustment disorder with anxiety and  depressed mood  Plan of Care: outpatient psychotherapy Treatment plan/goals: Patient seeks counseling to help manage emotions related to caring for aging mother with dementia, with whom she has a complicated history. She is also experiencing unresolved grief related to numerous losses and is seeking to work through those feelings. She also reports very low  self-esteem. Will explore origins of this esteem issue and develop strategies to resolve/manage. Goal date 3-25. Patient was seen via video Public affairs consultant) platform. She was at home and provider in his home office.  Session Note Patient was seen via video (Caregility) and is aware of the platform limitations. She was at home and provider was in home office. She said Cain Father's Day was her husband's 65th birthday. They went to the family cabin with her 2 sons and husband. Son turns 25 tomorrow. Her son is still having some anxiety at work and is "considering" counseling. She had some ENT surgery and is recovering well. Has not yet seen the orthopedic doctor, but will be scheduling Cain appointment soon. Discussed EFT and EMDR to help with her phobias and mourning. She talked about her tendency to avoid conflict and be a people pleaser. She says Cain she learned to path of least resistance in her FOO from father. Her avoidance is an attempt to dodge the "wrath" of others in response to her. She is even reluctant to express her needs. We need to work on her expressing her needs in personal relationships.                      Garrel Ridgel, PhD  11:40a-12:30p 50 minutes

## 2023-01-11 ENCOUNTER — Ambulatory Visit (INDEPENDENT_AMBULATORY_CARE_PROVIDER_SITE_OTHER): Payer: BC Managed Care – PPO | Admitting: Psychology

## 2023-01-11 DIAGNOSIS — F4323 Adjustment disorder with mixed anxiety and depressed mood: Secondary | ICD-10-CM | POA: Diagnosis not present

## 2023-01-11 NOTE — Progress Notes (Addendum)
[facility] Counselor Initial Adult Exam  Name: Damayah [facility] Date: 01/11/2023 MRN: [id number] DOB: [birth year] PCP: Dois Davenport, MD    Guardian/Payee:  N/A    Paperwork requested: No   Reason for Visit /Presenting Problem: mild depression and anxiety, grief, poor self-esteem  Mental Status Exam: Appearance:   Casual     Behavior:  Appropriate  Motor:  Normal  Speech/Language:   Clear and Coherent  Affect:  Appropriate  Mood:  normal  Thought process:  normal  Thought content:    WNL  Sensory/Perceptual disturbances:    WNL  Orientation:  oriented to person, place, and situation  Attention:  Good  Concentration:  Good  Memory:  WNL  Fund of knowledge:   Good  Insight:    Good  Judgment:   Good  Impulse Control:  Good     Reported Symptoms:  guilt, grief (sadness), depleted sense of self  Risk Assessment: Danger to Self:  No Self-injurious Behavior: No Danger to Others: No Duty to Warn:no Physical Aggression / Violence:No  Access to Firearms a concern: No  Gang Involvement:No  Patient / guardian was educated about steps to take if suicide or homicide risk level increases between visits: n/a While future psychiatric events cannot be accurately predicted, the patient does not currently require acute inpatient psychiatric care and does not currently meet [facility] involuntary commitment criteria.  Substance Abuse History: Current substance abuse: No     Past Psychiatric History:   No previous psychological problems have been observed Outpatient Providers:N/A History of Psych Hospitalization: No  Psychological Testing:  N/A    Abuse History:  Victim of: No.,  N/A    Report needed: No. Victim of Neglect:No. Perpetrator of  N/A    Witness / Exposure to Domestic Violence: No   Protective Services Involvement: No  Witness to [employer] Violence:  No   Family History:  Family History  Problem Relation Age of Onset   Hypertension Mother    Breast cancer Mother    Heart disease Father    Hyperlipidemia Father    Stroke Father    Leukemia Brother    Breast cancer Maternal Aunt    Diabetes Son        type 1 in both sons   Diabetes Son    Colon cancer Neg Hx    Stomach cancer Neg Hx    Esophageal cancer Neg Hx    Rectal cancer Neg Hx     Living situation: the patient lives with their spouse  Sexual Orientation: Straight  Relationship Status: married  Name of spouse / other:[name] If a parent, number of children / ages:Two boys, ages 62 and 34  Support Systems: spouse  Surveyor, quantity Stress:  No   Income/Employment/Disability: Employment  Financial planner: No   Educational History: Education:  unknown  Oncologist: unknown  Any cultural differences that  may affect / interfere with treatment:  not applicable   Recreation/Hobbies: reading  Stressors: Marital or family conflict    Strengths: Supportive Relationships  Barriers:  undetermined   Legal History: Pending legal issue / charges: The patient has no significant history of legal issues. 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 shoulder   SQUAMOUS CELL CARCINOMA EXCISION  09/2015   Right ear   thumb surgery Left 1975    Medications: Current Outpatient Medications  Medication Sig Dispense Refill   B Complex Vitamins (B COMPLEX PO) Take 1 tablet by mouth daily.     budesonide-formoterol (SYMBICORT) 160-4.5 MCG/ACT inhaler Inhale 2 puffs into the lungs 2 (two) times daily. (Patient not taking: Reported on 10/29/2022)     Calcium Carbonate (CALTRATE 600 PO) Take 600 mg by mouth 2 (two) times daily.     cholecalciferol (VITAMIN D) 1000 UNITS tablet Take 1,000 Units by mouth daily.     Coenzyme Q10 (CO Q 10 PO) Take 1 tablet by mouth daily.     Continuous Blood Gluc Sensor (DEXCOM G4 SENSOR) MISC by Does not apply route. G7     Cyanocobalamin (VITAMIN B-12 PO) Take 1 tablet by mouth daily.     fish oil-omega-3 fatty acids 1000 MG capsule Take 2 g by mouth daily.     fluticasone (FLONASE) 50 MCG/ACT nasal spray Place into both nostrils daily.     JARDIANCE 25 MG TABS tablet Take 25 mg by mouth daily.     lamoTRIgine (LAMICTAL) 100 MG tablet Take 100 mg by mouth 2 (two) times daily.     LANTUS SOLOSTAR 100 UNIT/ML Solostar Pen Inject 6 Units into the skin 2 (two) times daily.     loratadine (CLARITIN) 10 MG tablet Take 10 mg by mouth daily.     MAGNESIUM PO Take 1 tablet by mouth daily.     montelukast (SINGULAIR) 10 MG tablet Take 10 mg by mouth at bedtime.     Multiple Vitamins-Minerals (MULTIVITAMIN WITH MINERALS) tablet Take 1 tablet by mouth daily.     NOVOLOG 100 UNIT/ML injection 100u/day [place] via pump for 90 days     Probiotic Product (PROBIOTIC DAILY PO) Take 1 tablet by mouth daily.     rosuvastatin (CRESTOR) 5 MG tablet Take 5 mg by mouth daily.     thyroid (ARMOUR) 15 MG tablet Take 15 mg by mouth daily. Takes with 60mg      thyroid (ARMOUR) 60 MG tablet Take 60 mg by mouth daily before breakfast. Takes with 15mg      Turmeric 450 MG CAPS Take  by mouth 2 (two) times  daily.     No current facility-administered medications for this visit.    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 She has issues of grief separation and abandonment. Recent diagnosis of type 1 diabetic and insulin dependent. Her sons are diabetic as well. Most recently consumed with the news that her father's wife is terminally ill. Father died a few years ago, and Lanaya had a lot of anger and resentment toward father's wife. She is angry, bitter and hurt toward this woman, who also treated [employer] [place] poorly. Says her father's closest friends also had issues with his wife. Tilley knew this woman for almost 30 years and they are very different. They did not really connect well from the start. This woman was very controlling. Jakirra says "I don't like people to dislike me and her step mother did not like her. Taianna feels guilty and fears that family members have a bad perception of her because of her father's wife. In particular, her deceased brother's family had a good relationship with her step-mother because they lived so far away [facility]).  Her parents divorced in 48 when Nelah was 57. They had separated twice before. It was a bitter divorce and mother did not want the divorce. Orpha has 1 deceased brother who was 3 years older and a sister 4 years younger (in [place], [place]). Brother died of complications from bone marrow transplant. Her mother in law was diagnosed with cancer around same time and passed away. Her brother was best friends with her husband and they worked together. Her husband, father and brother worked in their own business together. Her husband  Judie Grieve) now owns the business with [employer]. 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 Waver and Judie Grieve married 33 years and together 8 before that. They have 2 boys. (Dylan age 77 and [place] age  53). Both live in [place]. Valentina Lucks lives with girlfriend. Both boys have diabetes. Her marital relationship is "up and down". Relationship with mother: "She was not a nurturing person". Kristie became very independent when her parents divorced. She wonders if mother is on spectrum. She says mom was quirky, especially socially. Mom was not affectionate and they did not share interests. Mom had lots of friends, but not with a lot of depth.  Her "grief" hits her "really hard". Lots of loss in her life. Uncle committed suicide, brother's death and mother in law's death. Mareena gets very sad with separation and thinks it was because of parents separations and divorce. When her mother went away to teach, it was devastating for Stefanny and felt that maybe she was to blame.  Her weekly routine: Says she has backed off because they now "butt heads". So goes to see her at [facility] once a week and drives her to appointments. She now goes out to visit to help her do laundry and other tasks.  She states that depression runs in her family. She tries to focus on self-care (exercise, socialize). She is invlved in community work. No big hobbies, but likes to read. Rarely drinks (maybe weekly) and occasional weed. Mother was diagnosed with depression, father and his brother both had depression. Nasiah's sister is also prone to depression. She takes Lamotrigine for several years. Level of depression is about a 3 of 10. Has some transient anxiety. Sleep and appetite is fine. Does mindfulness and meditation practices.  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 She is also experiencing unresolved grief related to numerous losses and is seeking to work through those feelings. She also  reports very low self-esteem. Will explore origins of this esteem issue and develop strategies to resolve/manage. Goal date 3-25. Patient agrees to be seen via video Public affairs consultant) platform. Patient understands and agrees to limits of platform. She was at home and provider in his home office.  Session Note She went with sister to visit relatives (cousins) in [place]. It was most time she and sister have spent together in a long time (5 days). Sister has history of "snapping" at [place], but she did not do this during this trip. In spite of stress, they managed to get along. She talked about not being comfortable with expressing affection. Bothers her that she may send wrong message to her children. In her FOO, nobody was affectionate. If people are expressive to her, she feels more comfortable expressing affection back to them. She says she cannot even touch her mother.This has been a challenge in adulthood.  She is seeking counseling for 29 year old son and asked for my help in locating a counselor.                           Garrel Ridgel, PhD 5:10p-6:00p 50 minutes

## 2023-01-20 ENCOUNTER — Ambulatory Visit: Payer: BC Managed Care – PPO | Admitting: Psychology

## 2023-01-20 DIAGNOSIS — F4323 Adjustment disorder with mixed anxiety and depressed mood: Secondary | ICD-10-CM | POA: Diagnosis not present

## 2023-01-20 NOTE — Progress Notes (Signed)
[facility] Counselor Initial Adult Exam  Name: DAILYN REITH Date: 01/20/2023 MRN: [id number] DOB: 1960-11-12 PCP: Dois Davenport, MD    Guardian/Payee:  N/A    Paperwork requested: No   Reason for Visit /Presenting Problem: mild depression and anxiety, grief, poor self-esteem  Mental Status Exam: Appearance:   Casual     Behavior:  Appropriate  Motor:  Normal  Speech/Language:   Clear and Coherent  Affect:  Appropriate  Mood:  normal  Thought process:  normal  Thought content:    WNL  Sensory/Perceptual disturbances:    WNL  Orientation:  oriented to person, place, and situation  Attention:  Good  Concentration:  Good  Memory:  WNL  Fund of knowledge:   Good  Insight:    Good  Judgment:   Good  Impulse Control:  Good     Reported Symptoms:  guilt, grief (sadness), depleted sense of self  Risk Assessment: Danger to Self:  No Self-injurious Behavior: No Danger to Others: No Duty to Warn:no Physical Aggression / Violence:No  Access to Firearms a concern: No  Gang Involvement:No  Patient / guardian was educated about steps to take if suicide or homicide risk level increases between visits: n/a While future psychiatric events cannot be accurately predicted, the patient does not currently require acute inpatient psychiatric care and does not currently meet [facility] involuntary commitment criteria.  Substance Abuse History: Current substance abuse: No     Past Psychiatric History:   No previous psychological problems have been observed Outpatient Providers:N/A History of Psych Hospitalization: No  Psychological Testing:  N/A    Abuse History:  Victim of: No.,  N/A    Report needed: No. Victim of  Neglect:No. Perpetrator of  N/A   Witness / Exposure to Domestic Violence: No   Protective Services Involvement: No  Witness to [employer] Violence:  No   Family History:  Family History  Problem Relation Age of Onset   Hypertension Mother    Breast cancer Mother    Heart disease Father    Hyperlipidemia Father    Stroke Father    Leukemia Brother    Breast cancer Maternal Aunt    Diabetes Son        type 1 in both sons   Diabetes Son    Colon cancer Neg Hx    Stomach cancer Neg Hx    Esophageal cancer Neg Hx    Rectal cancer Neg Hx     Living situation: the patient lives with their spouse  Sexual Orientation: Straight  Relationship Status: married  Name of spouse / other:[name] If a parent, number of children / ages:Two boys, ages 61 and 69  Support Systems: spouse  Financial Stress:  No   Income/Employment/Disability: Employment  Financial planner: No  Educational History: Education:  unknown  Religion/Sprituality/World View: unknown  Any cultural differences that may affect / interfere with treatment:  not applicable   Recreation/Hobbies: reading  Stressors: Marital or family conflict    Strengths: Supportive Relationships  Barriers:  undetermined   Legal History: Pending legal issue / charges: The patient has no significant history of legal issues. 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 shoulder   SQUAMOUS CELL CARCINOMA EXCISION  09/2015   Right ear   thumb surgery Left 1975    Medications: Current Outpatient Medications  Medication Sig Dispense Refill   B Complex Vitamins (B COMPLEX PO) Take 1 tablet by mouth daily.     budesonide-formoterol (SYMBICORT) 160-4.5 MCG/ACT inhaler Inhale 2 puffs into the lungs 2 (two) times daily. (Patient not taking: Reported on 10/29/2022)     Calcium Carbonate (CALTRATE 600 PO) Take 600 mg by mouth 2 (two) times daily.     cholecalciferol (VITAMIN D) 1000 UNITS tablet Take 1,000 Units by mouth daily.     Coenzyme Q10 (CO Q 10 PO) Take 1 tablet by mouth daily.     Continuous Blood Gluc Sensor (DEXCOM G4 SENSOR) MISC by Does not apply route. G7     Cyanocobalamin (VITAMIN B-12 PO) Take 1 tablet by mouth daily.     fish oil-omega-3 fatty acids 1000 MG capsule Take 2 g by mouth daily.     fluticasone (FLONASE) 50 MCG/ACT nasal spray Place into both nostrils daily.     JARDIANCE 25 MG TABS tablet Take 25 mg by mouth daily.     lamoTRIgine (LAMICTAL) 100 MG tablet Take 100 mg by mouth 2 (two) times daily.     LANTUS SOLOSTAR 100 UNIT/ML Solostar Pen Inject 6 Units into the skin 2 (two) times daily.     loratadine (CLARITIN) 10 MG tablet Take 10 mg by mouth daily.     MAGNESIUM PO Take 1 tablet by mouth daily.     montelukast (SINGULAIR) 10 MG tablet Take 10 mg by mouth at bedtime.     Multiple Vitamins-Minerals (MULTIVITAMIN WITH MINERALS) tablet Take 1 tablet by mouth daily.     NOVOLOG 100 UNIT/ML injection 100u/day [place] via pump for 90 days     Probiotic Product (PROBIOTIC DAILY PO) Take 1 tablet by mouth daily.     rosuvastatin (CRESTOR) 5 MG tablet Take 5 mg by mouth daily.     thyroid (ARMOUR) 15 MG tablet Take 15 mg by mouth daily. Takes with 60mg      thyroid (ARMOUR) 60 MG tablet Take 60 mg by mouth daily before breakfast. Takes with  15mg   Turmeric 450 MG CAPS Take by mouth 2 (two) times daily.     No current facility-administered medications for this visit.    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 She has issues of grief separation and abandonment. Recent diagnosis of type 1 diabetic and insulin dependent. Her sons are diabetic as well. Most recently consumed with the news that her father's wife is terminally ill. Father died a few years ago, and Sharilyn had a lot of anger and resentment toward father's wife. She is angry, bitter and hurt toward this woman, who also treated [employer] poorly. Says her father's closest friends also had issues with his wife. Jenasia knew this woman for almost 30 years and they are very different. They did not really connect well from the start. This woman was very controlling. Trudie says "I don't like people to dislike me and her step mother did not like her. Peni feels guilty and fears that family members have a bad perception of her because of her father's wife. In particular, her deceased brother's family had a good relationship with her step-mother because they lived so far away [facility]).  Her parents divorced in 36 when Rhodia was 69. They had separated twice before. It was a bitter divorce and mother did not want the divorce. Sansa has 1 deceased brother who was 3 years older and a sister 4 years younger (in [place], [place]). Brother died of complications from bone marrow transplant. Her mother in law was diagnosed with cancer around same time and passed away. Her brother was best friends with her husband and they worked together. Her husband, father and brother worked in their own business together. Her husband  Judie Grieve) now owns the business with [employer]. 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 Rosetta and Judie Grieve married 33 years and together 8 before that. They have 2  boys. (Dylan age 44 and [place] age 13). Both live in [place]. Valentina Lucks lives with girlfriend. Both boys have diabetes. Her marital relationship is "up and down". Relationship with mother: "She was not a nurturing person". Morgane became very independent when her parents divorced. She wonders if mother is on spectrum. She says mom was quirky, especially socially. Mom was not affectionate and they did not share interests. Mom had lots of friends, but not with a lot of depth.  Her "grief" hits her "really hard". Lots of loss in her life. Uncle committed suicide, brother's death and mother in law's death. Devri gets very sad with separation and thinks it was because of parents separations and divorce. When her mother went away to teach, it was devastating for Winni and felt that maybe she was to blame.  Her weekly routine: Says she has backed off because they now "butt heads". So goes [facility] at [facility] once a week and drives her to appointments. She now goes out to visit to help her do laundry and other tasks.  She states that depression runs in her family. She tries to focus on self-care (exercise, socialize). She is invlved in community work. No big hobbies, but likes to read. Rarely drinks (maybe weekly) and occasional weed. Mother was diagnosed with depression, father and his brother both had depression. Nataya's sister is also prone to depression. She takes Lamotrigine for several years. Level of depression is about a 3 of 10. Has some transient anxiety. Sleep and  appetite is fine. Does mindfulness and meditation practices.           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 She is also experiencing unresolved grief related to numerous losses and is seeking to work  through those feelings. She also reports very low self-esteem. Will explore origins of this esteem issue and develop strategies to resolve/manage. Goal date 3-25. Patient was seen via video Public affairs consultant) platform. She was at home and provider in his home office.  Session Note Patient was seen in the provider's office. She said she is having some sadness issues with regard to their cabin in [place]. She feels her father is there as well as her step mother. She was just there this past Friday night. It takes 12-24 hours for her to start to feel comfortable. The cabin is owned by [employer] and her sibs (sister and deceased brother's kids). We talked about the things she can do to make the cabin environment positive without being disloyal to th family.  She said she is starting to keep a gratitude journal and trying to stay more positive. She does say she found a "new trigger" with her mother. She means that she loses patience and gets frustrated with her mother's inability to integrate information. She is now realizing that her mother is "not the same person" she once was and she cannot help that her cognitions are compromised.  She is doing a much better job at self-care. She is wanting to work on her fear of closed spaces. Talked with a friend about learning breathing and relaxation techniques. She needs an MRI and wants to get herself prepared so she isn't fearful. We also talked about guided imagery and utilizing technology.  Journal: One theme emerges of Cathi interacting with her mother.                               Garrel Ridgel, PhD 11:40a-12:30p 50 minutes

## 2023-02-01 ENCOUNTER — Ambulatory Visit: Payer: BC Managed Care – PPO | Admitting: Psychology

## 2023-02-01 DIAGNOSIS — F4323 Adjustment disorder with mixed anxiety and depressed mood: Secondary | ICD-10-CM

## 2023-02-01 NOTE — Progress Notes (Signed)
[facility] Counselor Initial Adult Exam  Name: Megan Cain [facility] Date: 02/01/2023 MRN: [id number] DOB: 1961/01/09 PCP: Dois Davenport, MD    Guardian/Payee:  N/A    Paperwork requested: No   Reason for Visit /Presenting Problem: mild depression and anxiety, grief, poor self-esteem  Mental Status Exam: Appearance:   Casual     Behavior:  Appropriate  Motor:  Normal  Speech/Language:   Clear and Coherent  Affect:  Appropriate  Mood:  normal  Thought process:  normal  Thought content:    WNL  Sensory/Perceptual disturbances:    WNL  Orientation:  oriented to person, place, and situation  Attention:  Good  Concentration:  Good  Memory:  WNL  Fund of knowledge:   Good  Insight:    Good  Judgment:   Good  Impulse Control:  Good     Reported Symptoms:  guilt, grief (sadness), depleted sense of self  Risk Assessment: Danger to Self:  No Self-injurious Behavior: No Danger to Others: No Duty to Warn:no Physical Aggression / Violence:No  Access to Firearms a concern: No  Gang Involvement:No  Patient / guardian was educated about steps to take if suicide or homicide risk level increases between visits: n/a While future psychiatric events cannot be accurately predicted, the patient does not currently require acute inpatient psychiatric care and does not currently meet [facility] involuntary commitment criteria.  Substance Abuse History: Current substance abuse: No     Past Psychiatric History:   No previous psychological problems have been observed Outpatient Providers:N/A History of Psych Hospitalization: No  Psychological Testing:  N/A    Abuse History:  Victim of: No.,  N/A     Report needed: No. Victim of Neglect:No. Perpetrator of  N/A   Witness / Exposure to Domestic Violence: No   Protective Services Involvement: No  Witness to [employer] Violence:  No   Family History:  Family History  Problem Relation Age of Onset   Hypertension Mother    Breast cancer Mother    Heart disease Father    Hyperlipidemia Father    Stroke Father    Leukemia Brother    Breast cancer Maternal Aunt    Diabetes Son        type 1 in both sons   Diabetes Son    Colon cancer Neg Hx    Stomach cancer Neg Hx    Esophageal cancer Neg Hx    Rectal cancer Neg Hx     Living situation: the patient lives with their spouse  Sexual Orientation: Straight  Relationship Status: married  Name of spouse / other:[name] If a parent, number of children / ages:Two boys, ages 51 and 65  Support Systems:  spouse  Financial Stress:  No   Income/Employment/Disability: Employment  Financial planner: No   Educational History: Education:  unknown  Oncologist: unknown  Any cultural differences that may affect / interfere with treatment:  not applicable   Recreation/Hobbies: reading  Stressors: Marital or family conflict    Strengths: Supportive Relationships  Barriers:  undetermined   Legal History: Pending legal issue / charges: The patient has no significant history of legal issues. 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 shoulder   SQUAMOUS CELL CARCINOMA EXCISION  09/2015   Right ear   thumb surgery Left 1975    Medications: Current Outpatient Medications  Medication Sig Dispense Refill   B Complex Vitamins (B COMPLEX PO) Take 1 tablet by mouth daily.     budesonide-formoterol (SYMBICORT) 160-4.5 MCG/ACT inhaler Inhale 2 puffs into the lungs 2 (two) times daily. (Patient not taking: Reported on 10/29/2022)     Calcium Carbonate (CALTRATE 600 PO) Take 600 mg by mouth 2 (two) times daily.     cholecalciferol (VITAMIN D) 1000 UNITS tablet Take 1,000 Units by mouth daily.     Coenzyme Q10 (CO Q 10 PO) Take 1 tablet by mouth daily.     Continuous Blood Gluc Sensor (DEXCOM G4 SENSOR) MISC by Does not apply route. G7     Cyanocobalamin (VITAMIN B-12 PO) Take 1 tablet by mouth daily.     fish oil-omega-3 fatty acids 1000 MG capsule Take 2 g by mouth daily.     fluticasone (FLONASE) 50 MCG/ACT nasal spray Place into both nostrils daily.     JARDIANCE 25 MG TABS tablet Take 25 mg by mouth daily.     lamoTRIgine (LAMICTAL) 100 MG tablet Take 100 mg by mouth 2 (two) times daily.     LANTUS SOLOSTAR 100 UNIT/ML Solostar Pen Inject 6 Units into the skin 2 (two) times daily.     loratadine (CLARITIN) 10 MG tablet Take 10 mg by mouth daily.     MAGNESIUM PO Take 1 tablet by mouth daily.     montelukast (SINGULAIR) 10 MG tablet Take 10 mg by mouth at bedtime.     Multiple Vitamins-Minerals (MULTIVITAMIN WITH MINERALS) tablet Take 1 tablet by mouth daily.     NOVOLOG 100 UNIT/ML injection 100u/day [place] via pump for 90 days     Probiotic Product (PROBIOTIC DAILY PO) Take 1 tablet by mouth daily.     rosuvastatin (CRESTOR) 5 MG tablet Take 5 mg by mouth daily.     thyroid (ARMOUR) 15 MG tablet Take 15 mg by mouth daily. Takes with 60mg      thyroid (  ARMOUR) 60 MG tablet Take 60 mg by mouth  daily before breakfast. Takes with 15mg      Turmeric 450 MG CAPS Take by mouth 2 (two) times daily.     No current facility-administered medications for this visit.    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 She has issues of grief separation and abandonment. Recent diagnosis of type 1 diabetic and insulin dependent. Her sons are diabetic as well. Most recently consumed with the news that her father's wife is terminally ill. Father died a few years ago, and Betzabeth had a lot of anger and resentment toward father's wife. She is angry, bitter and hurt toward this woman, who also treated [employer] poorly. Says her father's closest friends also had issues with his wife. Sereniti knew this woman for almost 30 years and they are very different. They did not really connect well from the start. This woman was very controlling. Daffney says "I don't like people to dislike me and her step mother did not like her. Sylvester feels guilty and fears that family members have a bad perception of her because of her father's wife. In particular, her deceased brother's family had a good relationship with her step-mother because they lived so far away [facility]).  Her parents divorced in 65 when Leandra was 37. They had separated twice before. It was a bitter divorce and mother did not want the divorce. Tabor has 1 deceased brother who was 3 years older and a sister 4 years younger (in [place], [place]). Brother died of complications from bone marrow transplant. Her mother in law was diagnosed with cancer around same time and passed away. Her brother was best friends with her husband and they worked together. Her husband, father and brother worked in their own business together. Her husband  Judie Grieve) now owns the business with [employer]. 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 Lujuana and Judie Grieve married 33 years and  together 8 before that. They have 2 boys. (Dylan age 23 and [place] age 75). Both live in [place]. Valentina Lucks lives with girlfriend. Both boys have diabetes. Her marital relationship is "up and down". Relationship with mother: "She was not a nurturing person". Collene became very independent when her parents divorced. She wonders if mother is on spectrum. She says mom was quirky, especially socially. Mom was not affectionate and they did not share interests. Mom had lots of friends, but not with a lot of depth.  Her "grief" hits her "really hard". Lots of loss in her life. Uncle committed suicide, brother's death and mother in law's death. Shateka gets very sad with separation and thinks it was because of parents separations and divorce. When her mother went away to teach, it was devastating for Vaneza and felt that maybe she was to blame.  Her weekly routine: Says she has backed off because they now "butt heads". So goes to see her at [facility] once a week and drives her to appointments. She now goes out to visit to help her do laundry and other tasks.  She states that depression runs in her family. She tries to focus on self-care (exercise, socialize). She is invlved in community work. No big hobbies, but likes to read. Rarely drinks (maybe weekly) and occasional weed. Mother was diagnosed with depression, father and his brother both had depression. Lacora's sister is also prone to depression. She  takes Lamotrigine for several years. Level of depression is about a 3 of 10. Has some transient anxiety. Sleep and appetite is fine. Does mindfulness and meditation practices.           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 She is also experiencing unresolved grief related to  numerous losses and is seeking to work through those feelings. She also reports very low self-esteem. Will explore origins of this esteem issue and develop strategies to resolve/manage. Goal date 3-25. Patient agreed to a video Public affairs consultant) platform and understands the limitations of the platform. She was at home and provider in his home office.  Session Note Patients states she is using yoga techniques to help her breath better and help calm her down. She isn't journaling as often as she needs and says it is hard to maintain rituals. She needs needs structure. She tries to write down a to do list or calendar. She needs the accountability. We talked about how to organize and prioritize her tasks.  Leaves for cabin in [place] over weekend. She says her younger (depressed) son is resistant to getting help and it bothers her. She is trying to convince him to reach out                                Garrel Ridgel, PhD 10:40a-11:30p 50 minutes

## 2023-02-10 ENCOUNTER — Ambulatory Visit: Payer: BC Managed Care – PPO | Admitting: Psychology

## 2023-02-10 DIAGNOSIS — F4323 Adjustment disorder with mixed anxiety and depressed mood: Secondary | ICD-10-CM

## 2023-02-10 NOTE — Progress Notes (Signed)
[facility] Counselor Initial Adult Exam  Name: Shyrah Salow [facility] Date: 02/10/2023 MRN: [id number] DOB: 28-Jan-1961 PCP: Dois Davenport, MD    Guardian/Payee:  N/A    Paperwork requested: No   Reason for Visit /Presenting Problem: mild depression and anxiety, grief, poor self-esteem  Mental Status Exam: Appearance:   Casual     Behavior:  Appropriate  Motor:  Normal  Speech/Language:   Clear and Coherent  Affect:  Appropriate  Mood:  normal  Thought process:  normal  Thought content:    WNL  Sensory/Perceptual disturbances:    WNL  Orientation:  oriented to person, place, and situation  Attention:  Good  Concentration:  Good  Memory:  WNL  Fund of knowledge:   Good  Insight:    Good  Judgment:   Good  Impulse Control:  Good     Reported Symptoms:  guilt, grief (sadness), depleted sense of self  Risk Assessment: Danger to Self:  No Self-injurious Behavior: No Danger to Others: No Duty to Warn:no Physical Aggression / Violence:No  Access to Firearms a concern: No  Gang Involvement:No  Patient / guardian was educated about steps to take if suicide or homicide risk level increases between visits: n/a While future psychiatric events cannot be accurately predicted, the patient does not currently require acute inpatient psychiatric care and does not currently meet [facility] involuntary commitment criteria.  Substance Abuse History: Current substance abuse: No     Past Psychiatric History:   No previous psychological problems have been observed Outpatient Providers:N/A History of Psych Hospitalization: No  Psychological Testing:  N/A    Abuse History:  Victim  of: No.,  N/A    Report needed: No. Victim of Neglect:No. Perpetrator of  N/A   Witness / Exposure to Domestic Violence: No   Protective Services Involvement: No  Witness to [employer] Violence:  No   Family History:  Family History  Problem Relation Age of Onset   Hypertension Mother    Breast cancer Mother    Heart disease Father    Hyperlipidemia Father    Stroke Father    Leukemia Brother    Breast cancer Maternal Aunt    Diabetes Son        type 1 in both sons   Diabetes Son    Colon cancer Neg Hx    Stomach cancer Neg Hx    Esophageal cancer Neg Hx    Rectal cancer Neg Hx     Living situation: the patient lives with their spouse  Sexual Orientation: Straight  Relationship Status: married  Name of spouse / other:[name] If a parent, number of children /  ages:Two boys, ages 1 and 62  Support Systems: spouse  Surveyor, quantity Stress:  No   Income/Employment/Disability: Employment  Financial planner: No   Educational History: Education:  unknown  Oncologist: unknown  Any cultural differences that may affect / interfere with treatment:  not applicable   Recreation/Hobbies: reading  Stressors: Marital or family conflict    Strengths: Supportive Relationships  Barriers:  undetermined   Legal History: Pending legal issue / charges: The patient has no significant history of legal issues. 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 shoulder   SQUAMOUS CELL CARCINOMA EXCISION  09/2015   Right ear   thumb surgery Left 1975    Medications: Current Outpatient Medications  Medication Sig Dispense Refill   B Complex Vitamins (B COMPLEX PO) Take 1 tablet by mouth daily.     budesonide-formoterol (SYMBICORT) 160-4.5 MCG/ACT inhaler Inhale 2 puffs into the lungs 2 (two) times daily. (Patient not taking: Reported on 62/18/2024)     Calcium Carbonate (CALTRATE 600 PO) Take 600 mg by mouth 2 (two) times daily.     cholecalciferol (VITAMIN D) 1000 UNITS tablet Take 1,000 Units by mouth daily.     Coenzyme Q10 (CO Q 10 PO) Take 1 tablet by mouth daily.     Continuous Blood Gluc Sensor (DEXCOM G4 SENSOR) MISC by Does not apply route. G7     Cyanocobalamin (VITAMIN B-12 PO) Take 1 tablet by mouth daily.     fish oil-omega-3 fatty acids 1000 MG capsule Take 2 g by mouth daily.     fluticasone (FLONASE) 50 MCG/ACT nasal spray Place into both nostrils daily.     JARDIANCE 25 MG TABS tablet Take 25 mg by mouth daily.     lamoTRIgine (LAMICTAL) 100 MG tablet Take 100 mg by mouth 2 (two) times daily.     LANTUS SOLOSTAR 100 UNIT/ML Solostar Pen Inject 6 Units into the skin 2 (two) times daily.     loratadine (CLARITIN) 10 MG tablet Take 10 mg by mouth daily.     MAGNESIUM PO Take 1 tablet by mouth daily.     montelukast (SINGULAIR) 10 MG tablet Take 10 mg by mouth at bedtime.     Multiple Vitamins-Minerals (MULTIVITAMIN WITH MINERALS) tablet Take 1 tablet by mouth daily.     NOVOLOG 100 UNIT/ML injection 100u/day [place] via pump for 90 days     Probiotic Product (PROBIOTIC DAILY PO) Take 1 tablet by mouth daily.     rosuvastatin (CRESTOR) 5 MG tablet Take 5 mg by mouth daily.     thyroid (ARMOUR) 15 MG tablet Take 15 mg by mouth  daily. Takes with 60mg      thyroid (ARMOUR) 60 MG tablet Take  60 mg by mouth daily before breakfast. Takes with 15mg      Turmeric 450 MG CAPS Take by mouth 2 (two) times daily.     No current facility-administered medications for this visit.    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 She has issues of grief separation and abandonment. Recent diagnosis of type 1 diabetic and insulin dependent. Her sons are diabetic as well. Most recently consumed with the news that her father's wife is terminally ill. Father died a few years ago, and Jissell had a lot of anger and resentment toward father's wife. She is angry, bitter and hurt toward this woman, who also treated [employer] poorly. Says her father's closest friends also had issues with his wife. Venesha knew this woman for almost 30 years and they are very different. They did not really connect well from the start. This woman was very controlling. Anshika says "I don't like people to dislike me and her step mother did not like her. Ward feels guilty and fears that family members have a bad perception of her because of her father's wife. In particular, her deceased brother's family had a good relationship with her step-mother because they lived so far away [facility]).  Her parents divorced in 36 when Preslynn was 28. They had separated twice before. It was a bitter divorce and mother did not want the divorce. Briseida has 1 deceased brother who was 3 years older and a sister 4 years younger (in [place], [place]). Brother died of complications from bone marrow transplant. Her mother in law was diagnosed with cancer around same time and passed away. Her brother was best friends with her husband and they worked together. Her husband, father and brother worked in their own business together. Her husband  Judie Grieve) now owns the business with [employer]. 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 Vicy and Judie Grieve married  33 years and together 8 before that. They have 2 boys. (Dylan age 14 and [place] age 27). Both live in [place]. Valentina Lucks lives with girlfriend. Both boys have diabetes. Her marital relationship is "up and down". Relationship with mother: "She was not a nurturing person". Kashyra became very independent when her parents divorced. She wonders if mother is on spectrum. She says mom was quirky, especially socially. Mom was not affectionate and they did not share interests. Mom had lots of friends, but not with a lot of depth.  Her "grief" hits her "really hard". Lots of loss in her life. Uncle committed suicide, brother's death and mother in law's death. Willo gets very sad with separation and thinks it was because of parents separations and divorce. When her mother went away to teach, it was devastating for Aulii and felt that maybe she was to blame.  Her weekly routine: Says she has backed off because they now "butt heads". So goes to see her at [facility] once a week and drives her to appointments. She now goes out to visit to help her do laundry and other tasks.  She states that depression runs in her family. She tries to focus on self-care (exercise, socialize). She is invlved in community work. No big hobbies, but likes to read. Rarely drinks (maybe weekly) and occasional weed. Mother was diagnosed with depression, father and his brother both had  depression. Anglea's sister is also prone to depression. She takes Lamotrigine for several years. Level of depression is about a 3 of 10. Has some transient anxiety. Sleep and appetite is fine. Does mindfulness and meditation practices.           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 She is also experiencing unresolved grief  related to numerous losses and is seeking to work through those feelings. She also reports very low self-esteem. Will explore origins of this esteem issue and develop strategies to resolve/manage. Goal date 3-25. Patient was seen in provider's office.  Session Note She says that she and her husband went to [facility] and when she is at the house there, she often feels sad with memories. That did not happen this time. She felt better just acknowledging that they are going to change the house to make it more their own and make some important changes. She is still seeking routine in her life and adhering to a "to do" list. She has decided that she is going to tackle 2 difficult tasks a week. She is starting with getting her mother to receive her father's social security. She is still frustrated by her father's wife, whom she feels took advantage of her father financially. She said that there was a Care Planning meeting at [facility] for her mother. Anjenette is now going through the process of changing her care over to a physician at the facility. Mother doesn't really understand, but will be okay with the transfer. She has been thinking about changing her name, but is now thinking she will not change her name.    Will address her claustrophobia at next session. Needs to help plan for her MRI.          Garrel Ridgel, PhD 11:40a-12:30p 50 minutes

## 2023-03-03 ENCOUNTER — Ambulatory Visit: Payer: BC Managed Care – PPO | Admitting: Psychology

## 2023-03-03 DIAGNOSIS — F4323 Adjustment disorder with mixed anxiety and depressed mood: Secondary | ICD-10-CM

## 2023-03-03 NOTE — Progress Notes (Signed)
[facility] Counselor Initial Adult Exam  Name: Megan Cain Date: 03/03/2023 MRN: [id number] DOB: 02-09-1961 PCP: Dois Davenport, MD    Guardian/Payee:  N/A    Paperwork requested: No   Reason for Visit /Presenting Problem: mild depression and anxiety, grief, poor self-esteem  Mental Status Exam: Appearance:   Casual     Behavior:  Appropriate  Motor:  Normal  Speech/Language:   Clear and Coherent  Affect:  Appropriate  Mood:  normal  Thought process:  normal  Thought content:    WNL  Sensory/Perceptual disturbances:    WNL  Orientation:  oriented to person, place, and situation  Attention:  Good  Concentration:  Good  Memory:  WNL  Fund of knowledge:   Good  Insight:    Good  Judgment:   Good  Impulse Control:  Good     Reported Symptoms:  guilt, grief (sadness), depleted sense of self  Risk Assessment: Danger to Self:  No Self-injurious Behavior: No Danger to Others: No Duty to Warn:no Physical Aggression / Violence:No  Access to Firearms a concern: No  Gang Involvement:No  Patient / guardian was educated about steps to take if suicide or homicide risk level increases between visits: n/a While future psychiatric events cannot be accurately predicted, the patient does not currently require acute inpatient psychiatric care and does not currently meet [facility] involuntary commitment criteria.  Substance Abuse History: Current substance abuse: No     Past Psychiatric History:   No previous psychological problems have been observed Outpatient Providers:N/A History of Psych Hospitalization: No  Psychological Testing:  N/A     Abuse History:  Victim of: No.,  N/A    Report needed: No. Victim of Neglect:No. Perpetrator of  N/A   Witness / Exposure to Domestic Violence: No   Protective Services Involvement: No  Witness to [employer] Violence:  No   Family History:  Family History  Problem Relation Age of Onset   Hypertension Mother    Breast cancer Mother    Heart disease Father    Hyperlipidemia Father    Stroke Father    Leukemia Brother    Breast cancer Maternal Aunt    Diabetes Son        type 1 in both sons   Diabetes Son    Colon cancer Neg Hx    Stomach cancer Neg Hx    Esophageal cancer Neg Hx    Rectal cancer Neg Hx     Living situation: the patient lives with their spouse  Sexual Orientation: Straight  Relationship Status: married  Name of spouse / other:[name] If a parent, number of children / ages:Two boys, ages 37 and 39  Support Systems: spouse  Surveyor, quantity Stress:  No   Income/Employment/Disability: Employment  Financial planner: No   Educational History: Education:  unknown  Oncologist: unknown  Any cultural differences that may affect / interfere with treatment:  not applicable   Recreation/Hobbies: reading  Stressors: Marital or family conflict    Strengths: Supportive Relationships  Barriers:  undetermined   Legal History: Pending legal issue / charges: The patient has no significant history of legal issues. 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 shoulder   SQUAMOUS CELL CARCINOMA EXCISION  09/2015   Right ear   thumb surgery Left 1975    Medications: Current Outpatient Medications  Medication Sig Dispense Refill   B Complex Vitamins (B COMPLEX PO) Take 1 tablet by mouth daily.     budesonide-formoterol (SYMBICORT) 160-4.5 MCG/ACT inhaler Inhale 2 puffs into the lungs 2 (two) times daily. (Patient not taking: Reported on 10/29/2022)     Calcium Carbonate (CALTRATE 600 PO) Take 600 mg by mouth 2 (two) times daily.     cholecalciferol (VITAMIN D) 1000 UNITS tablet Take 1,000 Units by mouth daily.     Coenzyme Q10 (CO Q 10 PO) Take 1 tablet by mouth daily.     Continuous Blood Gluc Sensor (DEXCOM G4 SENSOR) MISC by Does not apply route. G7     Cyanocobalamin (VITAMIN B-12 PO) Take 1 tablet by mouth daily.     fish oil-omega-3 fatty acids 1000 MG capsule Take 2 g by mouth daily.     fluticasone (FLONASE) 50 MCG/ACT nasal spray Place into both nostrils daily.     JARDIANCE 25 MG TABS tablet Take 25 mg by mouth daily.     lamoTRIgine (LAMICTAL) 100 MG tablet Take 100 mg by mouth 2 (two) times daily.     LANTUS SOLOSTAR 100 UNIT/ML Solostar Pen Inject 6 Units into the skin 2 (two) times daily.     loratadine (CLARITIN) 10 MG tablet Take 10 mg by mouth daily.     MAGNESIUM PO Take 1 tablet by mouth daily.     montelukast (SINGULAIR) 10 MG tablet Take 10 mg by mouth at bedtime.     Multiple Vitamins-Minerals (MULTIVITAMIN WITH MINERALS) tablet Take 1 tablet by mouth daily.     NOVOLOG 100 UNIT/ML injection 100u/day [place] via pump for 90 days     Probiotic Product (PROBIOTIC DAILY PO) Take 1 tablet by mouth daily.     rosuvastatin (CRESTOR) 5 MG tablet Take 5 mg by  mouth daily.     thyroid (ARMOUR) 15 MG tablet Take 15 mg by mouth daily. Takes with 60mg      thyroid  (ARMOUR) 60 MG tablet Take 60 mg by mouth daily before breakfast. Takes with 15mg      Turmeric 450 MG CAPS Take by mouth 2 (two) times daily.     No current facility-administered medications for this visit.    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 She has issues of grief separation and abandonment. Recent diagnosis of type 1 diabetic and insulin dependent. Her sons are diabetic as well. Most recently consumed with the news that her father's wife is terminally ill. Father died a few years ago, and Keslee had a lot of anger and resentment toward father's wife. She is angry, bitter and hurt toward this woman, who also treated [employer] poorly. Says her father's closest friends also had issues with his wife. Natalyia knew this woman for almost 30 years and they are very different. They did not really connect well from the start. This woman was very controlling. Anela says "I don't like people to dislike me and her step mother did not like her. Nataly feels guilty and fears that family members have a bad perception of her because of her father's wife. In particular, her deceased brother's family had a good relationship with her step-mother because they lived so far away [facility]).  Her parents divorced in 61 when Xylia was 84. They had separated twice before. It was a bitter divorce and mother did not want the divorce. Theodore has 1 deceased brother who was 3 years older and a sister 4 years younger (in [place], [place]). Brother died of complications from bone marrow transplant. Her mother in law was diagnosed with cancer around same time and passed away. Her brother was best friends with her husband and they worked together. Her husband, father and brother worked in their own business together. Her husband  Judie Grieve) now owns the business with [employer]. 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  Suhaila and Judie Grieve married 33 years and together 8 before that. They have 2 boys. (Dylan age 72 and [place] age 21). Both live in [place]. Valentina Lucks lives with girlfriend. Both boys have diabetes. Her marital relationship is "up and down". Relationship with mother: "She was not a nurturing person". Oralia became very independent when her parents divorced. She wonders if mother is on spectrum. She says mom was quirky, especially socially. Mom was not affectionate and they did not share interests. Mom had lots of friends, but not with a lot of depth.  Her "grief" hits her "really hard". Lots of loss in her life. Uncle committed suicide, brother's death and mother in law's death. Cay gets very sad with separation and thinks it was because of parents separations and divorce. When her mother went away to teach, it was devastating for Shauni and felt that maybe she was to blame.  Her weekly routine: Says she has backed off because they now "butt heads". So goes to see her at [facility] once a week and drives her to appointments. She now goes out to visit to help her do laundry and other tasks.  She states that depression runs in her family. She tries to focus on self-care (exercise, socialize). She is invlved in community work. No big hobbies, but likes to read. Rarely drinks (  maybe weekly) and occasional weed. Mother was diagnosed with depression, father and his brother both had depression. Indianna's sister is also prone to depression. She takes Lamotrigine for several years. Level of depression is about a 3 of 10. Has some transient anxiety. Sleep and appetite is fine. Does mindfulness and meditation practices.           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 She is also  experiencing unresolved grief related to numerous losses and is seeking to work through those feelings. She also reports very low self-esteem. Will explore origins of this esteem issue and develop strategies to resolve/manage. Goal date 3-25. Patient was seen in provider's office.  Session Note She says she went to [place] as planned. During that time she had plaster repair at home and it created a huge mess. She has been practicing for her MRI. Has created a situation that is similar to a confined space that replicates the MRI situation. We talked about her practicing imagery to assist in her preparation.  Her mother's dementia is continuing to be difficult for Swannie. She thinks her mother is being manipulative with her at times.  Also, Brieanna is reluctant to ask the facility to help check on her mother. Britny tends to not assert herself. Coached her on talking with her mother's facility about clarifying expectations. Idalina says that her son has quit her job and is going to be joining the family business. She has told him that counseling is non-negotiable to deal with his anxiety and depression.                Garrel Ridgel, PhD 9:40a-10:30a 50 minutes

## 2023-03-17 ENCOUNTER — Ambulatory Visit (INDEPENDENT_AMBULATORY_CARE_PROVIDER_SITE_OTHER): Payer: BC Managed Care – PPO | Admitting: Psychology

## 2023-03-17 DIAGNOSIS — F4323 Adjustment disorder with mixed anxiety and depressed mood: Secondary | ICD-10-CM | POA: Diagnosis not present

## 2023-03-17 NOTE — Progress Notes (Signed)
[facility] Counselor Initial Adult Exam  Name: Megan Cain [facility] Date: 03/17/2023 MRN: [id number] DOB: September 11, 1960 PCP: Megan Davenport, MD    Guardian/Payee:  N/A    Paperwork requested: No   Reason for Visit /Presenting Problem: mild depression and anxiety, grief, poor self-esteem  Mental Status Exam: Appearance:   Casual     Behavior:  Appropriate  Motor:  Normal  Speech/Language:   Clear and Coherent  Affect:  Appropriate  Mood:  normal  Thought process:  normal  Thought content:    WNL  Sensory/Perceptual disturbances:    WNL  Orientation:  oriented to person, place, and situation  Attention:  Good  Concentration:  Good  Memory:  WNL  Fund of knowledge:   Good  Insight:    Good  Judgment:   Good  Impulse Control:  Good     Reported Symptoms:  guilt, grief (sadness), depleted sense of self  Risk Assessment: Danger to Self:  No Self-injurious Behavior: No Danger to Others: No Duty to Warn:no Physical Aggression / Violence:No  Access to Firearms a concern: No  Gang Involvement:No  Patient / guardian was educated about steps to take if suicide or homicide risk level increases between visits: n/a While future psychiatric events cannot be accurately predicted, the patient does not currently require acute inpatient psychiatric care and does not currently meet [facility] involuntary commitment criteria.  Substance Abuse History: Current substance abuse: No     Past Psychiatric History:   No previous psychological problems have been observed Outpatient Providers:N/A History of Psych Hospitalization: No  Psychological Testing:  N/A     Abuse History:  Victim of: No.,  N/A    Report needed: No. Victim of Neglect:No. Perpetrator of  N/A   Witness / Exposure to Domestic Violence: No   Protective Services Involvement: No  Witness to [employer] Violence:  No   Family History:  Family History  Problem Relation Age of Onset   Hypertension Mother    Breast cancer Mother    Heart disease Father    Hyperlipidemia Father    Stroke Father    Leukemia Brother    Breast cancer Maternal Aunt    Diabetes Son        type 1 in both sons   Diabetes Son    Colon cancer Neg Hx    Stomach cancer Neg Hx    Esophageal cancer Neg Hx    Rectal cancer Neg Hx     Living situation: the patient lives with their spouse  Sexual Orientation: Straight  Relationship Status: married  Name of spouse / other:Megan Cain If a parent, number of children / ages:Two boys, ages 85 and 33  Support Systems: spouse  Surveyor, quantity Stress:  No   Income/Employment/Disability: Employment  Financial planner: No   Educational History: Education:  unknown  Oncologist: unknown  Any cultural differences that may affect / interfere with treatment:  not applicable   Recreation/Hobbies: reading  Stressors: Marital or family conflict    Strengths: Supportive Relationships  Barriers:  undetermined   Legal History: Pending legal issue / charges: The patient has no significant history of legal issues. 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 shoulder   SQUAMOUS CELL CARCINOMA EXCISION  09/2015   Right ear   thumb surgery Left 1975    Medications: Current Outpatient Medications  Medication Sig Dispense Refill   B Complex Vitamins (B COMPLEX PO) Take 1 tablet by mouth daily.     budesonide-formoterol (SYMBICORT) 160-4.5 MCG/ACT inhaler Inhale 2 puffs into the lungs 2 (two) times daily. (Patient not taking: Reported on 10/29/2022)     Calcium Carbonate (CALTRATE 600 PO) Take 600 mg by mouth 2 (two) times daily.     cholecalciferol (VITAMIN D) 1000 UNITS tablet Take 1,000 Units by mouth daily.     Coenzyme Q10 (CO Q 10 PO) Take 1 tablet by mouth daily.     Continuous Blood Gluc Sensor (DEXCOM G4 SENSOR) MISC by Does not apply route. G7     Cyanocobalamin (VITAMIN B-12 PO) Take 1 tablet by mouth daily.     fish oil-omega-3 fatty acids 1000 MG capsule Take 2 g by mouth daily.     fluticasone (FLONASE) 50 MCG/ACT nasal spray Place into both nostrils daily.     JARDIANCE 25 MG TABS tablet Take 25 mg by mouth daily.     lamoTRIgine (LAMICTAL) 100 MG tablet Take 100 mg by mouth 2 (two) times daily.     LANTUS SOLOSTAR 100 UNIT/ML Solostar Pen Inject 6 Units into the skin 2 (two) times daily.     loratadine (CLARITIN) 10 MG tablet Take 10 mg by mouth daily.     MAGNESIUM PO Take 1 tablet by mouth daily.     montelukast (SINGULAIR) 10 MG tablet Take 10 mg by mouth at bedtime.     Multiple Vitamins-Minerals (MULTIVITAMIN WITH MINERALS) tablet Take 1 tablet by mouth daily.     NOVOLOG 100 UNIT/ML injection 100u/day [place] via pump for 90 days     Probiotic Product (PROBIOTIC DAILY PO) Take 1 tablet by mouth daily.     rosuvastatin (CRESTOR) 5 MG tablet Take 5 mg by  mouth daily.     thyroid (ARMOUR) 15 MG tablet Take 15 mg by mouth daily. Takes with 60mg      thyroid  (ARMOUR) 60 MG tablet Take 60 mg by mouth daily before breakfast. Takes with 15mg      Turmeric 450 MG CAPS Take by mouth 2 (two) times daily.     No current facility-administered medications for this visit.    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 She has issues of grief separation and abandonment. Recent diagnosis of type 1 diabetic and insulin dependent. Her sons are diabetic as well. Most recently consumed with the news that her father's wife is terminally ill. Father died a few years ago, and Tersia had a lot of anger and resentment toward father's wife. She is angry, bitter and hurt toward this woman, who also treated [employer] poorly. Says her father's closest friends also had issues with his wife. Jeannia knew this woman for almost 30 years and they are very different. They did not really connect well from the start. This woman was very controlling. Mayvis says "I don't like people to dislike me and her step mother did not like her. Brittanie feels guilty and fears that family members have a bad perception of her because of her father's wife. In particular, her deceased brother's family had a good relationship with her step-mother because they lived so far away [facility]).  Her parents divorced in 69 when Anastasiya was 61. They had separated twice before. It was a bitter divorce and mother did not want the divorce. Cloie has 1 deceased brother who was 3 years older and a sister 4 years younger (in [place], [place]). Brother died of complications from bone marrow transplant. Her mother in law was diagnosed with cancer around same time and passed away. Her brother was best friends with her husband and they worked together. Her husband, father and brother worked in their own business together. Her husband  Judie Grieve) now owns the business with [employer]. 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  Valeda and Judie Grieve married 33 years and together 8 before that. They have 2 boys. (Dylan age 13 and [place] age 51). Both live in [place]. Valentina Lucks lives with girlfriend. Both boys have diabetes. Her marital relationship is "up and down". Relationship with mother: "She was not a nurturing person". Analuiza became very independent when her parents divorced. She wonders if mother is on spectrum. She says mom was quirky, especially socially. Mom was not affectionate and they did not share interests. Mom had lots of friends, but not with a lot of depth.  Her "grief" hits her "really hard". Lots of loss in her life. Uncle committed suicide, brother's death and mother in law's death. Deetra gets very sad with separation and thinks it was because of parents separations and divorce. When her mother went away to teach, it was devastating for Esmee and felt that maybe she was to blame.  Her weekly routine: Says she has backed off because they now "butt heads". So goes to see her at [facility] once a week and drives her to appointments. She now goes out to visit to help her do laundry and other tasks.  She states that depression runs in her family. She tries to focus on self-care (exercise, socialize). She is invlved in community work. No big hobbies, but likes to read. Rarely drinks (  maybe weekly) and occasional weed. Mother was diagnosed with depression, father and his brother both had depression. Claramae's sister is also prone to depression. She takes Lamotrigine for several years. Level of depression is about a 3 of 10. Has some transient anxiety. Sleep and appetite is fine. Does mindfulness and meditation practices.           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 She is also  experiencing unresolved grief related to numerous losses and is seeking to work through those feelings. She also reports very low self-esteem. Will explore origins of this esteem issue and develop strategies to resolve/manage. Goal date 3-25. Patient was seen in provider's office.  Session Note: She still has not talked to her mother's facility about expectations, but she plans to very soon.  Her son quit his job and he has lined up with his father to work in the family business. Kellyn was surprised that he made this decision. She talked about her desire, when she was in her twenties, to join the business, but got the message from her father that he was not interested. This was hurtful and she went to work elsewhere. Her brother ("the golden child") moved back home to take family business about 8 years after Judene left the business. Her father gave her brother 10-20% of the business. Beautiful's husband went to work in the business as well. Around father's retirement, the business faltered and they brought in a new company head who fired her brother. Her husband, brother and father started a new company. Her brother got Leukemia, was cured with bone marrow transplant and then got fatal lung infection and eventually died at age 40. Husband Arlys John took over as head of company.  Shannikia was looking at the company perhaps selling it or dismantle. Now that her son is in business, they have to decide the future.  Suggested that she talk with her husband about expectations for the business.                  Garrel Ridgel, PhD 2:09p-3:00p 51 minutes

## 2023-03-31 ENCOUNTER — Ambulatory Visit (INDEPENDENT_AMBULATORY_CARE_PROVIDER_SITE_OTHER): Payer: BC Managed Care – PPO | Admitting: Psychology

## 2023-03-31 DIAGNOSIS — F4323 Adjustment disorder with mixed anxiety and depressed mood: Secondary | ICD-10-CM

## 2023-03-31 NOTE — Progress Notes (Signed)
[facility] Counselor Initial Adult Exam  Name: Megan Cain [facility] Date: 03/31/2023 MRN: [id number] DOB: March 28, 1961 PCP: Dois Davenport, MD    Guardian/Payee:  N/A    Paperwork requested: No   Reason for Visit /Presenting Problem: mild depression and anxiety, grief, poor self-esteem  Mental Status Exam: Appearance:   Casual     Behavior:  Appropriate  Motor:  Normal  Speech/Language:   Clear and Coherent  Affect:  Appropriate  Mood:  normal  Thought process:  normal  Thought content:    WNL  Sensory/Perceptual disturbances:    WNL  Orientation:  oriented to person, place, and situation  Attention:  Good  Concentration:  Good  Memory:  WNL  Fund of knowledge:   Good  Insight:    Good  Judgment:   Good  Impulse Control:  Good     Reported Symptoms:  guilt, grief (sadness), depleted sense of self  Risk Assessment: Danger to Self:  No Self-injurious Behavior: No Danger to Others: No Duty to Warn:no Physical Aggression / Violence:No  Access to Firearms a concern: No  Gang Involvement:No  Patient / guardian was educated about steps to take if suicide or homicide risk level increases between visits: n/a While future psychiatric events cannot be accurately predicted, the patient does not currently require acute inpatient psychiatric care and does not currently meet [facility] involuntary commitment criteria.  Substance Abuse History: Current substance abuse: No     Past Psychiatric History:   No previous psychological problems have been observed Outpatient Providers:N/A History of Psych Hospitalization: No   Psychological Testing:  N/A    Abuse History:  Victim of: No.,  N/A    Report needed: No. Victim of Neglect:No. Perpetrator of  N/A   Witness / Exposure to Domestic Violence: No   Protective Services Involvement: No  Witness to [employer] Violence:  No   Family History:  Family History  Problem Relation Age of Onset   Hypertension Mother    Breast cancer Mother    Heart disease Father    Hyperlipidemia Father    Stroke Father    Leukemia Brother    Breast cancer Maternal Aunt    Diabetes Son        type 1 in both sons   Diabetes Son    Colon cancer Neg Hx    Stomach cancer Neg Hx    Esophageal cancer Neg Hx    Rectal cancer Neg Hx  Living situation: the patient lives with their spouse  Sexual Orientation: Straight  Relationship Status: married  Name of spouse / other:[name] If a parent, number of children / ages:Two boys, ages 13 and 30  Support Systems: spouse  Surveyor, quantity Stress:  No   Income/Employment/Disability: Employment  Financial planner: No   Educational History: Education:  unknown  Oncologist: unknown  Any cultural differences that may affect / interfere with treatment:  not applicable   Recreation/Hobbies: reading  Stressors: Marital or family conflict    Strengths: Supportive Relationships  Barriers:  undetermined   Legal History: Pending legal issue / charges: The patient has no significant history of legal issues. 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 shoulder   SQUAMOUS CELL CARCINOMA EXCISION  09/2015   Right ear   thumb surgery Left 1975    Medications: Current Outpatient Medications  Medication Sig Dispense Refill   B Complex Vitamins (B COMPLEX PO) Take 1 tablet by mouth daily.     budesonide-formoterol (SYMBICORT) 160-4.5 MCG/ACT inhaler Inhale 2 puffs into the lungs 2 (two) times daily. (Patient not taking: Reported on 10/29/2022)     Calcium Carbonate (CALTRATE 600 PO) Take 600 mg by mouth 2 (two) times daily.     cholecalciferol (VITAMIN D) 1000 UNITS tablet Take 1,000 Units by mouth daily.     Coenzyme Q10 (CO Q 10 PO) Take 1 tablet by mouth daily.     Continuous Blood Gluc Sensor (DEXCOM G4 SENSOR) MISC by Does not apply route. G7     Cyanocobalamin (VITAMIN B-12 PO) Take 1 tablet by mouth daily.     fish oil-omega-3 fatty acids 1000 MG capsule Take 2 g by mouth daily.     fluticasone (FLONASE) 50 MCG/ACT nasal spray Place into both nostrils daily.     JARDIANCE 25 MG TABS tablet Take 25 mg by mouth daily.     lamoTRIgine (LAMICTAL) 100 MG tablet Take 100 mg by mouth 2 (two) times daily.     LANTUS SOLOSTAR 100 UNIT/ML Solostar Pen Inject 6 Units into the skin 2 (two) times daily.     loratadine (CLARITIN) 10 MG tablet Take 10 mg by mouth daily.     MAGNESIUM PO Take 1 tablet by mouth daily.     montelukast (SINGULAIR) 10 MG tablet Take 10 mg by mouth at bedtime.     Multiple Vitamins-Minerals (MULTIVITAMIN WITH MINERALS) tablet Take 1 tablet by mouth daily.     NOVOLOG 100 UNIT/ML injection 100u/day [place] via pump for 90 days     Probiotic Product (PROBIOTIC DAILY PO) Take 1 tablet by mouth daily.  rosuvastatin (CRESTOR) 5 MG tablet Take 5 mg by mouth daily.     thyroid (ARMOUR) 15 MG tablet Take 15 mg by mouth daily.  Takes with 60mg      thyroid (ARMOUR) 60 MG tablet Take 60 mg by mouth daily before breakfast. Takes with 15mg      Turmeric 450 MG CAPS Take by mouth 2 (two) times daily.     No current facility-administered medications for this visit.    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 She has issues of grief separation and abandonment. Recent diagnosis of type 1 diabetic and insulin dependent. Her sons are diabetic as well. Most recently consumed with the news that her father's wife is terminally ill. Father died a few years ago, and Jennilee had a lot of anger and resentment toward father's wife. She is angry, bitter and hurt toward this woman, who also treated [employer] poorly. Says her father's closest friends also had issues with his wife. Cathy knew this woman for almost 30 years and they are very different. They did not really connect well from the start. This woman was very controlling. Ivelise says "I don't like people to dislike me and her step mother did not like her. Danyah feels guilty and fears that family members have a bad perception of her because of her father's wife. In particular, her deceased brother's family had a good relationship with her step-mother because they lived so far away [facility]).  Her parents divorced in 61 when Marnee was 55. They had separated twice before. It was a bitter divorce and mother did not want the divorce. Saliyah has 1 deceased brother who was 3 years older and a sister 4 years younger (in [place], [place]). Brother died of complications from bone marrow transplant. Her mother in law was diagnosed with cancer around same time and passed away. Her brother was best friends with her husband and they worked together. Her husband, father and brother worked in their own business together. Her husband  Judie Grieve) now owns the business with [employer].  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 Altie and Judie Grieve married 33 years and together 8 before that. They have 2 boys. (Dylan age 8 and [place] age 50). Both live in [place]. Valentina Lucks lives with girlfriend. Both boys have diabetes. Her marital relationship is "up and down". Relationship with mother: "She was not a nurturing person". Shareta became very independent when her parents divorced. She wonders if mother is on spectrum. She says mom was quirky, especially socially. Mom was not affectionate and they did not share interests. Mom had lots of friends, but not with a lot of depth.  Her "grief" hits her "really hard". Lots of loss in her life. Uncle committed suicide, brother's death and mother in law's death. Arilene gets very sad with separation and thinks it was because of parents separations and divorce. When her mother went away to teach, it was devastating for Mariaceleste and felt that maybe she was to blame.  Her weekly routine: Says she has backed off because they now "butt heads". So goes to see her at [facility] once a week and drives her to appointments. She now goes out to visit to help her do laundry and other tasks.  She states that depression runs in her family. She tries to focus on self-care (exercise, socialize). She is invlved in community work.  No big hobbies, but likes to read. Rarely drinks (maybe weekly) and occasional weed. Mother was diagnosed with depression, father and his brother both had depression. Bristal's sister is also prone to depression. She takes Lamotrigine for several years. Level of depression is about a 3 of 10. Has some transient anxiety. Sleep and appetite is fine. Does mindfulness and meditation practices.           Diagnoses:  Adjustment disorder with anxiety and depressed mood  Plan of Care: outpatient psychotherapy Treatment plan/goals: Patient seeks counseling to help manage emotions related to caring for aging mother with dementia, with whom she has a complicated  history. She is also experiencing unresolved grief related to numerous losses and is seeking to work through those feelings. She also reports very low self-esteem. Will explore origins of this esteem issue and develop strategies to resolve/manage. Goal date 3-25. Patient was seen in provider's office.  Session Note: She went to her nephew's wedding in [place] and it was a nice event.  She is feeling ready to commit to making the MRI appointment. She is working on a strategy to manage, but has not defined specifics. We talked about some strategies to manage the procedure.  She talked to her husband about the fact that she had never legally changed her name. She read that if she changes her name, she will get a new social security number. She always intended to change to her married name and "hasn't gotten around to it" in almost 33 years. She is thinking now that she will not change her name.  She did follow up from last session and asked about the policies at [facility] (specifically about showers).    Son starts with family business on Monday. Her son got into counseling for first session, but doesn't have second appointment for another month.  Garrel Ridgel, PhD 1:10p-2:00p 50 minutes

## 2023-04-05 ENCOUNTER — Other Ambulatory Visit: Payer: Self-pay | Admitting: Orthopedic Surgery

## 2023-04-05 DIAGNOSIS — M25511 Pain in right shoulder: Secondary | ICD-10-CM

## 2023-04-12 ENCOUNTER — Ambulatory Visit (INDEPENDENT_AMBULATORY_CARE_PROVIDER_SITE_OTHER): Payer: BC Managed Care – PPO | Admitting: Psychology

## 2023-04-12 DIAGNOSIS — F4323 Adjustment disorder with mixed anxiety and depressed mood: Secondary | ICD-10-CM | POA: Diagnosis not present

## 2023-04-12 NOTE — Progress Notes (Signed)
[facility] Counselor Initial Adult Exam  Name: Megan Cain Date: 04/12/2023 MRN: [id number] DOB: 1961/05/21 PCP: Dois Davenport, MD    Guardian/Payee:  N/A    Paperwork requested: No   Reason for Visit /Presenting Problem: mild depression and anxiety, grief, poor self-esteem  Mental Status Exam: Appearance:   Casual     Behavior:  Appropriate  Motor:  Normal  Speech/Language:   Clear and Coherent  Affect:  Appropriate  Mood:  normal  Thought process:  normal  Thought content:    WNL  Sensory/Perceptual disturbances:    WNL  Orientation:  oriented to person, place, and situation  Attention:  Good  Concentration:  Good  Memory:  WNL  Fund of knowledge:   Good  Insight:    Good  Judgment:   Good  Impulse Control:  Good     Reported Symptoms:  guilt, grief (sadness), depleted sense of self  Risk Assessment: Danger to Self:  No Self-injurious Behavior: No Danger to Others: No Duty to Warn:no Physical Aggression / Violence:No  Access to Firearms a concern: No  Gang Involvement:No  Patient / guardian was educated about steps to take if suicide or homicide risk level increases between visits: n/a While future psychiatric events cannot be accurately predicted, the patient does not currently require acute inpatient psychiatric care and does not currently meet [facility] involuntary commitment criteria.  Substance Abuse History: Current substance abuse: No     Past Psychiatric History:   No previous psychological problems have been observed Outpatient  Providers:N/A History of Psych Hospitalization: No  Psychological Testing:  N/A    Abuse History:  Victim of: No.,  N/A    Report needed: No. Victim of Neglect:No. Perpetrator of  N/A   Witness / Exposure to Domestic Violence: No   Protective Services Involvement: No  Witness to [employer] Violence:  No   Family History:  Family History  Problem Relation Age of Onset   Hypertension Mother    Breast cancer Mother    Heart disease Father    Hyperlipidemia Father    Stroke Father    Leukemia Brother    Breast cancer Maternal Aunt    Diabetes Son        type 1 in both sons   Diabetes Son    Colon cancer Neg Hx    Stomach cancer Neg Hx  Esophageal cancer Neg Hx    Rectal cancer Neg Hx     Living situation: the patient lives with their spouse  Sexual Orientation: Straight  Relationship Status: married  Name of spouse / other:[name] If a parent, number of children / ages:Two boys, ages 79 and 23  Support Systems: spouse  Surveyor, quantity Stress:  No   Income/Employment/Disability: Employment  Financial planner: No   Educational History: Education:  unknown  Oncologist: unknown  Any cultural differences that may affect / interfere with treatment:  not applicable   Recreation/Hobbies: reading  Stressors: Marital or family conflict    Strengths: Supportive Relationships  Barriers:  undetermined   Legal History: Pending legal issue / charges: The patient has no significant history of legal issues. 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 shoulder   SQUAMOUS CELL CARCINOMA EXCISION  09/2015   Right ear   thumb surgery Left 1975    Medications: Current Outpatient Medications  Medication Sig Dispense Refill   B Complex Vitamins (B COMPLEX PO) Take 1 tablet by mouth daily.     budesonide-formoterol (SYMBICORT) 160-4.5 MCG/ACT inhaler Inhale 2 puffs into the lungs 2 (two) times daily. (Patient not taking: Reported on 10/29/2022)     Calcium Carbonate (CALTRATE 600 PO) Take 600 mg by mouth 2 (two) times daily.     cholecalciferol (VITAMIN D) 1000 UNITS tablet Take 1,000 Units by mouth daily.     Coenzyme Q10 (CO Q 10 PO) Take 1 tablet by mouth daily.     Continuous Blood Gluc Sensor (DEXCOM G4 SENSOR) MISC by Does not apply route. G7     Cyanocobalamin (VITAMIN B-12 PO) Take 1 tablet by mouth daily.     fish oil-omega-3 fatty acids 1000 MG capsule Take 2 g by mouth daily.     fluticasone (FLONASE) 50 MCG/ACT nasal spray Place into both nostrils daily.     JARDIANCE 25 MG TABS tablet Take 25 mg by mouth daily.     lamoTRIgine (LAMICTAL) 100 MG tablet Take 100 mg by mouth 2 (two) times daily.     LANTUS SOLOSTAR 100 UNIT/ML Solostar Pen Inject 6 Units into the skin 2 (two) times daily.     loratadine (CLARITIN) 10 MG tablet Take 10 mg by mouth daily.     MAGNESIUM PO Take 1 tablet by mouth daily.     montelukast (SINGULAIR) 10 MG tablet Take 10 mg by mouth at bedtime.     Multiple Vitamins-Minerals (MULTIVITAMIN WITH MINERALS) tablet Take 1 tablet by mouth daily.     NOVOLOG 100 UNIT/ML injection 100u/day [place] via pump for 90 days  Probiotic Product (PROBIOTIC DAILY PO) Take 1 tablet by mouth daily.     rosuvastatin (CRESTOR) 5 MG tablet Take 5 mg by mouth daily.     thyroid  (ARMOUR) 15 MG tablet Take 15 mg by mouth daily. Takes with 60mg      thyroid (ARMOUR) 60 MG tablet Take 60 mg by mouth daily before breakfast. Takes with 15mg      Turmeric 450 MG CAPS Take by mouth 2 (two) times daily.     No current facility-administered medications for this visit.    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 She has issues of grief separation and abandonment. Recent diagnosis of type 1 diabetic and insulin dependent. Her sons are diabetic as well. Most recently consumed with the news that her father's wife is terminally ill. Father died a few years ago, and Nekeya had a lot of anger and resentment toward father's wife. She is angry, bitter and hurt toward this woman, who also treated [employer] poorly. Says her father's closest friends also had issues with his wife. Juneau knew this woman for almost 30 years and they are very different. They did not really connect well from the start. This woman was very controlling. Michiah says "I don't like people to dislike me and her step mother did not like her. Norabelle feels guilty and fears that family members have a bad perception of her because of her father's wife. In particular, her deceased brother's family had a good relationship with her step-mother because they lived so far away [facility]).  Her parents divorced in 15 when Karishma was 8. They had separated twice before. It was a bitter divorce and mother did not want the divorce. Danaly has 1 deceased brother who was 3 years older and a sister 4 years younger (in [place], [place]). Brother died of complications from bone marrow transplant. Her mother in law was diagnosed with cancer around same time and passed away. Her brother was best friends with her husband and they worked together. Her husband, father and brother worked in their own business together. Her  husband  Judie Grieve) now owns the business with [employer]. 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 Adelia and Judie Grieve married 33 years and together 8 before that. They have 2 boys. (Dylan age 82 and [place] age 26). Both live in [place]. Valentina Lucks lives with girlfriend. Both boys have diabetes. Her marital relationship is "up and down". Relationship with mother: "She was not a nurturing person". Addisen became very independent when her parents divorced. She wonders if mother is on spectrum. She says mom was quirky, especially socially. Mom was not affectionate and they did not share interests. Mom had lots of friends, but not with a lot of depth.  Her "grief" hits her "really hard". Lots of loss in her life. Uncle committed suicide, brother's death and mother in law's death. Amoy gets very sad with separation and thinks it was because of parents separations and divorce. When her mother went away to teach, it was devastating for Khiley and felt that maybe she was to blame.  Her weekly routine: Says she has backed off because they now "butt heads". So goes to see her at [facility] once a week and drives her to appointments. She now goes out to visit to help her do laundry and other tasks.  She states that depression runs in her  family. She tries to focus on self-care (exercise, socialize). She is invlved in community work. No big hobbies, but likes to read. Rarely drinks (maybe weekly) and occasional weed. Mother was diagnosed with depression, father and his brother both had depression. Orene's sister is also prone to depression. She takes Lamotrigine for several years. Level of depression is about a 3 of 10. Has some transient anxiety. Sleep and appetite is fine. Does mindfulness and meditation practices.           Diagnoses:  Adjustment disorder with anxiety and depressed mood  Plan of Care: outpatient psychotherapy Treatment plan/goals: Patient seeks counseling to help manage emotions related to caring for aging  mother with dementia, with whom she has a complicated history. She is also experiencing unresolved grief related to numerous losses and is seeking to work through those feelings. She also reports very low self-esteem. Will explore origins of this esteem issue and develop strategies to resolve/manage. Goal date 3-25. Patient was seen in provider's office.  Session Note: She and husband had a great weekend in [place]. She said the weekend highlighted for her how she manages very contradictory experiences. This weekend there were terrible [place] in the [place] and people were suffering while she was enjoying the music festival. We talked about how she grieves and what is healthy versus destructive patterns of pain.  She said her shoulder is doing much better with far less pain. She is now thinking of cancelling the MRI. She still, however, wants to work on her claustrophobia. Emotions have been stable and she feels they are getting along well.           Garrel Ridgel, PhD 11:40a-12:30p 50 minutes

## 2023-04-14 ENCOUNTER — Ambulatory Visit: Payer: BC Managed Care – PPO | Admitting: Psychology

## 2023-04-27 ENCOUNTER — Ambulatory Visit (INDEPENDENT_AMBULATORY_CARE_PROVIDER_SITE_OTHER): Payer: BC Managed Care – PPO | Admitting: Psychology

## 2023-04-27 DIAGNOSIS — F4323 Adjustment disorder with mixed anxiety and depressed mood: Secondary | ICD-10-CM | POA: Diagnosis not present

## 2023-04-27 NOTE — Progress Notes (Signed)
[facility] Counselor Initial Adult Exam  Name: Megan Cain [facility] Date: 04/27/2023 MRN: [id number] DOB: Mar 31, 1961 PCP: Megan Davenport, MD    Guardian/Payee:  N/A    Paperwork requested: No   Reason for Visit /Presenting Problem: mild depression and anxiety, grief, poor self-esteem  Mental Status Exam: Appearance:   Casual     Behavior:  Appropriate  Motor:  Normal  Speech/Language:   Clear and Coherent  Affect:  Appropriate  Mood:  normal  Thought process:  normal  Thought content:    WNL  Sensory/Perceptual disturbances:    WNL  Orientation:  oriented to person, place, and situation  Attention:  Good  Concentration:  Good  Memory:  WNL  Fund of knowledge:   Good  Insight:    Good  Judgment:   Good  Impulse Control:  Good     Reported Symptoms:  guilt, grief (sadness), depleted sense of self  Risk Assessment: Danger to Self:  No Self-injurious Behavior: No Danger to Others: No Duty to Warn:no Physical Aggression / Violence:No  Access to Firearms a concern: No  Gang Involvement:No  Patient / guardian was educated about steps to take if suicide or homicide risk level increases between visits: n/a While future psychiatric events cannot be accurately predicted, the patient does not currently require acute inpatient psychiatric care and does not currently meet [facility] involuntary commitment criteria.  Substance Abuse History: Current substance abuse: No     Past Psychiatric History:   No previous psychological problems have been  observed Outpatient Providers:N/A History of Psych Hospitalization: No  Psychological Testing:  N/A    Abuse History:  Victim of: No.,  N/A    Report needed: No. Victim of Neglect:No. Perpetrator of  N/A   Witness / Exposure to Domestic Violence: No   Protective Services Involvement: No  Witness to [employer] Violence:  No   Family History:  Family History  Problem Relation Age of Onset   Hypertension Mother    Breast cancer Mother    Heart disease Father    Hyperlipidemia Father    Stroke Father    Leukemia Brother    Breast cancer Maternal Aunt    Diabetes Son        type 1 in both sons   Diabetes Son  Colon cancer Neg Hx    Stomach cancer Neg Hx    Esophageal cancer Neg Hx    Rectal cancer Neg Hx     Living situation: the patient lives with their spouse  Sexual Orientation: Straight  Relationship Status: married  Name of spouse / other:Megan Cain If a parent, number of children / ages:Two boys, ages 40 and 19  Support Systems: spouse  Surveyor, quantity Stress:  No   Income/Employment/Disability: Employment  Financial planner: No   Educational History: Education:  unknown  Oncologist: unknown  Any cultural differences that may affect / interfere with treatment:  not applicable   Recreation/Hobbies: reading  Stressors: Marital or family conflict    Strengths: Supportive Relationships  Barriers:  undetermined   Legal History: Pending legal issue / charges: The patient has no significant history of legal issues. 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 shoulder   SQUAMOUS CELL CARCINOMA EXCISION  09/2015   Right ear   thumb surgery Left 1975    Medications: Current Outpatient Medications  Medication Sig Dispense Refill   B Complex Vitamins (B COMPLEX PO) Take 1 tablet by mouth daily.     budesonide-formoterol (SYMBICORT) 160-4.5 MCG/ACT inhaler Inhale 2 puffs into the lungs 2 (two) times daily. (Patient not taking: Reported on 10/29/2022)     Calcium Carbonate (CALTRATE 600 PO) Take 600 mg by mouth 2 (two) times daily.     cholecalciferol (VITAMIN D) 1000 UNITS tablet Take 1,000 Units by mouth daily.     Coenzyme Q10 (CO Q 10 PO) Take 1 tablet by mouth daily.     Continuous Blood Gluc Sensor (DEXCOM G4 SENSOR) MISC by Does not apply route. G7     Cyanocobalamin (VITAMIN B-12 PO) Take 1 tablet by mouth daily.     fish oil-omega-3 fatty acids 1000 MG capsule Take 2 g by mouth daily.     fluticasone (FLONASE) 50 MCG/ACT nasal spray Place into both nostrils daily.     JARDIANCE 25 MG TABS tablet Take 25 mg by mouth daily.     lamoTRIgine (LAMICTAL) 100 MG tablet Take 100 mg by mouth 2 (two) times daily.     LANTUS SOLOSTAR 100 UNIT/ML Solostar Pen Inject 6 Units into the skin 2 (two) times daily.     loratadine (CLARITIN) 10 MG tablet Take 10 mg by mouth daily.     MAGNESIUM PO Take 1 tablet by mouth daily.     montelukast (SINGULAIR) 10 MG tablet Take 10 mg by mouth at bedtime.     Multiple Vitamins-Minerals (MULTIVITAMIN WITH MINERALS) tablet Take 1 tablet by mouth daily.  NOVOLOG 100 UNIT/ML injection 100u/day Megan Cain via pump for 90 days     Probiotic Product (PROBIOTIC DAILY PO) Take 1 tablet by mouth daily.     rosuvastatin (CRESTOR) 5 MG tablet Take 5 mg by mouth  daily.     thyroid (ARMOUR) 15 MG tablet Take 15 mg by mouth daily. Takes with 60mg      thyroid (ARMOUR) 60 MG tablet Take 60 mg by mouth daily before breakfast. Takes with 15mg      Turmeric 450 MG CAPS Take by mouth 2 (two) times daily.     No current facility-administered medications for this visit.    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 She has issues of grief separation and abandonment. Recent diagnosis of type 1 diabetic and insulin dependent. Her sons are diabetic as well. Most recently consumed with the news that her father's wife is terminally ill. Father died a few years ago, and Megan Cain had a lot of anger and resentment toward father's wife. She is angry, bitter and hurt toward this woman, who also treated Megan Cain poorly. Says her father's closest friends also had issues with his wife. Megan Cain knew this woman for almost 30 years and they are very different. They did not really connect well from the start. This woman was very controlling. Megan Cain says "I don't like people to dislike me and her step mother did not like her. Megan Cain feels guilty and fears that family members have a bad perception of her because of her father's wife. In particular, her deceased brother's family had a good relationship with her step-mother because they lived so far away [facility]).  Her parents divorced in 50 when Megan Cain was 48. They had separated twice before. It was a bitter divorce and mother did not want the divorce. Megan Cain has 1 deceased brother who was 3 years older and a sister 4 years younger (in [place], [place]). Brother died of complications from bone marrow transplant. Her mother in law was diagnosed with cancer around same time and passed away. Her brother was best friends with her husband and they worked together. Her husband, father and brother worked in their own  business together. 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 Sherley and Megan Cain married Cain years and together 8 before that. They have 2 boys. (Megan Cain and Megan Cain). Both live in [place]. Megan Cain lives with girlfriend. Both boys have diabetes. Her marital relationship is "up and down". Relationship with mother: "She was not a nurturing person". Megan Cain became very independent when her parents divorced. She wonders if mother is on spectrum. She says mom was quirky, especially socially. Mom was not affectionate and they did not share interests. Mom had lots of friends, but not with a lot of depth.  Her "grief" hits her "really hard". Lots of loss in her life. Uncle committed suicide, brother's death and mother in law's death. Hildagard gets very sad with separation and thinks it was because of parents separations and divorce. When her mother went away to teach, it was devastating for Luzmaria and felt that maybe she was to blame.  Her weekly routine: Says she has backed off because they now "butt heads". So goes to see her at [facility] once a week and drives her to appointments. She now goes out to visit to  help her do laundry and other tasks.  She states that depression runs in her family. She tries to focus on self-care (exercise, socialize). She is invlved in community work. No big hobbies, but likes to read. Rarely drinks (maybe weekly) and occasional weed. Mother was diagnosed with depression, father and his brother both had depression. Irisa's sister is also prone to depression. She takes Lamotrigine for several years. Level of depression is about a 3 of 10. Has some transient anxiety. Sleep and appetite is fine. Does mindfulness and meditation practices.           Diagnoses:  Adjustment disorder with anxiety and depressed mood  Plan of Care: outpatient psychotherapy Treatment plan/goals: Patient seeks counseling to help manage emotions  related to caring for aging mother with dementia, with whom she has a complicated history. She is also experiencing unresolved grief related to numerous losses and is seeking to work through those feelings. She also reports very low self-esteem. Will explore origins of this esteem issue and develop strategies to resolve/manage. Goal date 3-25. Patient agreed to a video Public affairs consultant) session and is aware of the limitations of this platform. Patient is at home and provider is in home office.   Session Note: She and husband went to their cabin for a few days and had a nice relaxing weekend. She described feeling very grateful for having this property in her family. This week is 4 years since her father's death. She does report being more "scattered" lately. She brought up memory loss to her doctor when she had her annual exam. She has been involved in some volunteer activity, which is gratifying.  She says that she and her husband are going to meet friends/family at [facility]. She talked about a friend/ employee who is in legal trouble and she and husband are involved in helping him out. This is stressful, especially or her husband. Her son is still doing well at the family company. Other than scattered, she is feeling more emotionally stable.            Garrel Ridgel, PhD 5:10p-6:00p 50 minutes

## 2023-04-28 ENCOUNTER — Ambulatory Visit: Payer: BC Managed Care – PPO | Admitting: Psychology

## 2023-05-10 ENCOUNTER — Ambulatory Visit: Payer: BC Managed Care – PPO | Admitting: Psychology

## 2023-05-14 HISTORY — PX: TRIGGER FINGER RELEASE: SHX641

## 2023-05-26 ENCOUNTER — Ambulatory Visit: Payer: BC Managed Care – PPO | Admitting: Psychology

## 2023-05-26 DIAGNOSIS — F4323 Adjustment disorder with mixed anxiety and depressed mood: Secondary | ICD-10-CM | POA: Diagnosis not present

## 2023-05-26 NOTE — Progress Notes (Addendum)
[facility] Counselor Initial Adult Exam  Name: Megan Cain Date: 05/26/2023 MRN: [id number] DOB: 1961/05/20 PCP: Dois Davenport, MD    Guardian/Payee:  N/A    Paperwork requested: No   Reason for Visit /Presenting Problem: mild depression and anxiety, grief, poor self-esteem  Mental Status Exam: Appearance:   Casual     Behavior:  Appropriate  Motor:  Normal  Speech/Language:   Clear and Coherent  Affect:  Appropriate  Mood:  normal  Thought process:  normal  Thought content:    WNL  Sensory/Perceptual disturbances:    WNL  Orientation:  oriented to person, place, and situation  Attention:  Good  Concentration:  Good  Memory:  WNL  Fund of knowledge:   Good  Insight:    Good  Judgment:   Good  Impulse Control:  Good     Reported Symptoms:  guilt, grief (sadness), depleted sense of self  Risk Assessment: Danger to Self:  No Self-injurious Behavior: No Danger to Others: No Duty to Warn:no Physical Aggression / Violence:No  Access to Firearms a concern: No  Gang Involvement:No  Patient / guardian was educated about steps to take if suicide or homicide risk level increases between visits: n/a While future psychiatric events cannot be accurately predicted, the patient does not currently require acute inpatient psychiatric care and does not currently meet [facility] involuntary commitment criteria.  Substance Abuse History: Current substance abuse: No     Past Psychiatric History:   No previous psychological problems have been  observed Outpatient Providers:N/A History of Psych Hospitalization: No  Psychological Testing:  N/A    Abuse History:  Victim of: No.,  N/A    Report needed: No. Victim of Neglect:No. Perpetrator of  N/A   Witness / Exposure to Domestic Violence: No   Protective Services Involvement: No  Witness to [employer] Violence:  No   Family History:  Family History  Problem Relation Age of Onset   Hypertension Mother    Breast cancer Mother    Heart disease Father    Hyperlipidemia Father    Stroke Father    Leukemia Brother    Breast cancer Maternal Aunt    Diabetes Son        type 1 in both sons   Diabetes Son  Colon cancer Neg Hx    Stomach cancer Neg Hx    Esophageal cancer Neg Hx    Rectal cancer Neg Hx     Living situation: the patient lives with their spouse  Sexual Orientation: Straight  Relationship Status: married  Name of spouse / other:[name] If a parent, number of children / ages:Two boys, ages 84 and 45  Support Systems: spouse  Surveyor, quantity Stress:  No   Income/Employment/Disability: Employment  Financial planner: No   Educational History: Education:  unknown  Oncologist: unknown  Any cultural differences that may affect / interfere with treatment:  not applicable   Recreation/Hobbies: reading  Stressors: Marital or family conflict    Strengths: Supportive Relationships  Barriers:  undetermined   Legal History: Pending legal issue / charges: The patient has no significant history of legal issues. 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 shoulder   SQUAMOUS CELL CARCINOMA EXCISION  09/2015   Right ear   thumb surgery Left 1975    Medications: Current Outpatient Medications  Medication Sig Dispense Refill   B Complex Vitamins (B COMPLEX PO) Take 1 tablet by mouth daily.     budesonide-formoterol (SYMBICORT) 160-4.5 MCG/ACT inhaler Inhale 2 puffs into the lungs 2 (two) times daily. (Patient not taking: Reported on 10/29/2022)     Calcium Carbonate (CALTRATE 600 PO) Take 600 mg by mouth 2 (two) times daily.     cholecalciferol (VITAMIN D) 1000 UNITS tablet Take 1,000 Units by mouth daily.     Coenzyme Q10 (CO Q 10 PO) Take 1 tablet by mouth daily.     Continuous Blood Gluc Sensor (DEXCOM G4 SENSOR) MISC by Does not apply route. G7     Cyanocobalamin (VITAMIN B-12 PO) Take 1 tablet by mouth daily.     fish oil-omega-3 fatty acids 1000 MG capsule Take 2 g by mouth daily.     fluticasone (FLONASE) 50 MCG/ACT nasal spray Place into both nostrils daily.     JARDIANCE 25 MG TABS tablet Take 25 mg by mouth daily.     lamoTRIgine (LAMICTAL) 100 MG tablet Take 100 mg by mouth 2 (two) times daily.     LANTUS SOLOSTAR 100 UNIT/ML Solostar Pen Inject 6 Units into the skin 2 (two) times daily.     loratadine (CLARITIN) 10 MG tablet Take 10 mg by mouth daily.     MAGNESIUM PO Take 1 tablet by mouth daily.     montelukast (SINGULAIR) 10 MG tablet Take 10 mg by mouth at bedtime.     Multiple Vitamins-Minerals (MULTIVITAMIN WITH MINERALS) tablet Take 1 tablet by mouth daily.  NOVOLOG 100 UNIT/ML injection 100u/day [place] via pump for 90 days     Probiotic Product (PROBIOTIC DAILY PO) Take 1 tablet by mouth daily.     rosuvastatin (CRESTOR) 5 MG tablet Take 5 mg by mouth  daily.     thyroid (ARMOUR) 15 MG tablet Take 15 mg by mouth daily. Takes with 60mg      thyroid (ARMOUR) 60 MG tablet Take 60 mg by mouth daily before breakfast. Takes with 15mg      Turmeric 450 MG CAPS Take by mouth 2 (two) times daily.     No current facility-administered medications for this visit.    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 She has issues of grief separation and abandonment. Recent diagnosis of type 1 diabetic and insulin dependent. Her sons are diabetic as well. Most recently consumed with the news that her father's wife is terminally ill. Father died a few years ago, and Megan Cain had a lot of anger and resentment toward father's wife. She is angry, bitter and hurt toward this woman, who also treated Megan Cain poorly. Says her father's closest friends also had issues with his wife. Megan Cain knew this woman for almost 30 years and they are very different. They did not really connect well from the start. This woman was very controlling. Megan Cain says "I don't like people to dislike me and her step mother did not like her. Megan Cain feels guilty and fears that family members have a bad perception of her because of her father's wife. In particular, her deceased brother's family had a good relationship with her step-mother because they lived so far away [facility]).  Her parents divorced in 88 when Megan Cain was 63. They had separated twice before. It was a bitter divorce and mother did not want the divorce. Megan Cain has 1 deceased brother who was 3 years older and a sister 4 years younger (in [place], [place]). Brother died of complications from bone marrow transplant. Her mother in law was diagnosed with cancer around same time and passed away. Her brother was best friends with her husband and they worked together. Her husband, father and brother worked in their own  business together. 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 Tynita and Judie Grieve married 33 years and together 8 before that. They have 2 boys. (Dylan age 18 and [place] age 41). Both live in [place]. Valentina Lucks lives with girlfriend. Both boys have diabetes. Her marital relationship is "up and down". Relationship with mother: "She was not a nurturing person". Tresa became very independent when her parents divorced. She wonders if mother is on spectrum. She says mom was quirky, especially socially. Mom was not affectionate and they did not share interests. Mom had lots of friends, but not with a lot of depth.  Her "grief" hits her "really hard". Lots of loss in her life. Uncle committed suicide, brother's death and mother in law's death. Laliah gets very sad with separation and thinks it was because of parents separations and divorce. When her mother went away to teach, it was devastating for Deshona and felt that maybe she was to blame.  Her weekly routine: Says she has backed off because they now "butt heads". So goes to see her at [facility] once a week and drives her to appointments. She now goes out to visit to  help her do laundry and other tasks.  She states that depression runs in her family. She tries to focus on self-care (exercise, socialize). She is invlved in community work. No big hobbies, but likes to read. Rarely drinks (maybe weekly) and occasional weed. Mother was diagnosed with depression, father and his brother both had depression. Shanecia's sister is also prone to depression. She takes Lamotrigine for several years. Level of depression is about a 3 of 10. Has some transient anxiety. Sleep and appetite is fine. Does mindfulness and meditation practices.           Diagnoses:  Adjustment disorder with anxiety and depressed mood  Plan of Care: outpatient psychotherapy Treatment plan/goals: Patient seeks counseling to help manage emotions  related to caring for aging mother with dementia, with whom she has a complicated history. She is also experiencing unresolved grief related to numerous losses and is seeking to work through those feelings. She also reports very low self-esteem. Will explore origins of this esteem issue and develop strategies to resolve/manage. Goal date 3-25. Patient agreed to a video Public affairs consultant) session and is aware of the limitations of this platform. Patient is at home and provider is in home office.   Session Note: She and husband have been going to the cabin, but it is now winterized. She says she has been in a depressive funk, but is doing better now. It helps her "doing things with other people". She feels in a rut in that she is less likely to reach out these days. She tries to avoid doing things alone. She is retiring off some boards and is looking to fill that time.  States that her son is doing a good job at work which is a huge relief for her.  She says that she is working with sister in Social worker to consider a family gathering for the T-Giving holiday. Discussed best options.                Garrel Ridgel, PhD 1:10p-2:00p 50 minutes

## 2023-06-09 ENCOUNTER — Ambulatory Visit (INDEPENDENT_AMBULATORY_CARE_PROVIDER_SITE_OTHER): Payer: BC Managed Care – PPO

## 2023-06-09 ENCOUNTER — Ambulatory Visit: Payer: BC Managed Care – PPO | Admitting: Psychology

## 2023-06-29 ENCOUNTER — Ambulatory Visit: Payer: BC Managed Care – PPO | Admitting: Psychology

## 2023-06-29 DIAGNOSIS — F4323 Adjustment disorder with mixed anxiety and depressed mood: Secondary | ICD-10-CM

## 2023-06-29 NOTE — Progress Notes (Signed)
[facility] Counselor Initial Adult Exam  Name: Megan Cain [facility] Date: 06/29/2023 MRN: [id number] DOB: 1961-05-04 PCP: Dois Davenport, MD    Guardian/Payee:  N/A    Paperwork requested: No   Reason for Visit /Presenting Problem: mild depression and anxiety, grief, poor self-esteem  Mental Status Exam: Appearance:   Casual     Behavior:  Appropriate  Motor:  Normal  Speech/Language:   Clear and Coherent  Affect:  Appropriate  Mood:  normal  Thought process:  normal  Thought content:    WNL  Sensory/Perceptual disturbances:    WNL  Orientation:  oriented to person, place, and situation  Attention:  Good  Concentration:  Good  Memory:  WNL  Fund of knowledge:   Good  Insight:    Good  Judgment:   Good  Impulse Control:  Good     Reported Symptoms:  guilt, grief (sadness), depleted sense of self  Risk Assessment: Danger to Self:  No Self-injurious Behavior: No Danger to Others: No Duty to Warn:no Physical Aggression / Violence:No  Access to Firearms a concern: No  Gang Involvement:No  Patient / guardian was educated about steps to take if suicide or homicide risk level increases between visits: n/a While future psychiatric events cannot be accurately predicted, the patient does not currently require acute inpatient psychiatric care and does not currently meet [facility] involuntary commitment criteria.  Substance Abuse History: Current substance abuse: No     Past Psychiatric History:   No previous psychological  problems have been observed Outpatient Providers:N/A History of Psych Hospitalization: No  Psychological Testing:  N/A    Abuse History:  Victim of: No.,  N/A    Report needed: No. Victim of Neglect:No. Perpetrator of  N/A   Witness / Exposure to Domestic Violence: No   Protective Services Involvement: No  Witness to [employer] Violence:  No   Family History:  Family History  Problem Relation Age of Onset   Hypertension Mother    Breast cancer Mother    Heart disease Father    Hyperlipidemia Father    Stroke Father    Leukemia Brother    Breast cancer Maternal Aunt    Diabetes Son  type 1 in both sons   Diabetes Son    Colon cancer Neg Hx    Stomach cancer Neg Hx    Esophageal cancer Neg Hx    Rectal cancer Neg Hx     Living situation: the patient lives with their spouse  Sexual Orientation: Straight  Relationship Status: married  Name of spouse / other:[name] If a parent, number of children / ages:Two boys, ages 36 and 81  Support Systems: spouse  Surveyor, quantity Stress:  No   Income/Employment/Disability: Employment  Financial planner: No   Educational History: Education:  unknown  Oncologist: unknown  Any cultural differences that may affect / interfere with treatment:  not applicable   Recreation/Hobbies: reading  Stressors: Marital or family conflict    Strengths: Supportive Relationships  Barriers:  undetermined   Legal History: Pending legal issue / charges: The patient has no significant history of legal issues. 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 shoulder   SQUAMOUS CELL CARCINOMA EXCISION  09/2015   Right ear   thumb surgery Left 1975    Medications: Current Outpatient Medications  Medication Sig Dispense Refill   B Complex Vitamins (B COMPLEX PO) Take 1 tablet by mouth daily.     budesonide-formoterol (SYMBICORT) 160-4.5 MCG/ACT inhaler Inhale 2 puffs into the lungs 2 (two) times daily. (Patient not taking: Reported on 10/29/2022)     Calcium Carbonate (CALTRATE 600 PO) Take 600 mg by mouth 2 (two) times daily.     cholecalciferol (VITAMIN D) 1000 UNITS tablet Take 1,000 Units by mouth daily.     Coenzyme Q10 (CO Q 10 PO) Take 1 tablet by mouth daily.     Continuous Blood Gluc Sensor (DEXCOM G4 SENSOR) MISC by Does not apply route. G7     Cyanocobalamin (VITAMIN B-12 PO) Take 1 tablet by mouth daily.     fish oil-omega-3 fatty acids 1000 MG capsule Take 2 g by mouth daily.     fluticasone (FLONASE) 50 MCG/ACT nasal spray Place into both nostrils daily.     JARDIANCE 25 MG TABS tablet Take 25 mg by mouth daily.     lamoTRIgine (LAMICTAL) 100 MG tablet Take 100 mg by mouth 2 (two) times daily.     LANTUS SOLOSTAR 100 UNIT/ML Solostar Pen Inject 6 Units into the skin 2 (two) times daily.     loratadine (CLARITIN) 10 MG tablet Take 10 mg by mouth daily.     MAGNESIUM PO Take 1 tablet by mouth daily.     montelukast (SINGULAIR) 10 MG tablet Take 10 mg by mouth at bedtime.     Multiple  Vitamins-Minerals (MULTIVITAMIN WITH MINERALS) tablet Take 1 tablet by mouth daily.     NOVOLOG 100 UNIT/ML injection 100u/day [place] via pump for 90 days     Probiotic Product (PROBIOTIC DAILY PO) Take 1 tablet by mouth daily.     rosuvastatin (CRESTOR) 5 MG tablet  Take 5 mg by mouth daily.     thyroid (ARMOUR) 15 MG tablet Take 15 mg by mouth daily. Takes with 60mg      thyroid (ARMOUR) 60 MG tablet Take 60 mg by mouth daily before breakfast. Takes with 15mg      Turmeric 450 MG CAPS Take by mouth 2 (two) times daily.     No current facility-administered medications for this visit.    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 She has issues of grief separation and abandonment. Recent diagnosis of type 1 diabetic and insulin dependent. Her sons are diabetic as well. Most recently consumed with the news that her father's wife is terminally ill. Father died a few years ago, and Abel had a lot of anger and resentment toward father's wife. She is angry, bitter and hurt toward this woman, who also treated [place] poorly. Says her father's closest friends also had issues with his wife. Lilyian knew this woman for almost 30 years and they are very different. They did not really connect well from the start. This woman was very controlling. Vanida says "I don't like people to dislike me and her step mother did not like her. Eleah feels guilty and fears that family members have a bad perception of her because of her father's wife. In particular, her deceased brother's family had a good relationship with her step-mother because they lived so far away [facility]).  Her parents divorced in 56 when Mirely was 72. They had separated twice before. It was a bitter divorce and mother did not want the divorce. Madilyne has 1 deceased brother who was 3 years older and a sister 4 years younger (in [place], [place]). Brother died of complications from bone marrow transplant. Her mother in law was diagnosed with cancer around same time and passed away. Her brother was best friends with her husband and they worked together. Her husband, father and brother worked  in their own business together. Her husband  Judie Grieve) now owns the business with [employer]. 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 Cassee and Judie Grieve married 33 years and together 8 before that. They have 2 boys. (Dylan age 51 and [place] age 64). Both live in [place]. Valentina Lucks lives with girlfriend. Both boys have diabetes. Her marital relationship is "up and down". Relationship with mother: "She was not a nurturing person". Leannie became very independent when her parents divorced. She wonders if mother is on spectrum. She says mom was quirky, especially socially. Mom was not affectionate and they did not share interests. Mom had lots of friends, but not with a lot of depth.  Her "grief" hits her "really hard". Lots of loss in her life. Uncle committed suicide, brother's death and mother in law's death. Akshadha gets very sad with separation and thinks it was because of parents separations and divorce. When her mother went away to teach, it was devastating for Walaa and felt that maybe she was to blame.  Her weekly routine: Says she has backed off because they now "butt heads". So [facility] at [facility]  once a week and drives her to appointments. She now goes out to visit to help her do laundry and other tasks.  She states that depression runs in her family. She tries to focus on self-care (exercise, socialize). She is invlved in community work. No big hobbies, but likes to read. Rarely drinks (maybe weekly) and occasional weed. Mother was diagnosed with depression, father and his brother both had depression. Tawnee's sister is also prone to depression. She takes Lamotrigine for several years. Level of depression is about a 3 of 10. Has some transient anxiety. Sleep and appetite is fine. Does mindfulness and meditation practices.           Diagnoses:  Adjustment disorder with anxiety and depressed mood  Plan of Care: outpatient psychotherapy Treatment plan/goals: Patient seeks counseling to help manage  emotions related to caring for aging mother with dementia, with whom she has a complicated history. She is also experiencing unresolved grief related to numerous losses and is seeking to work through those feelings. She also reports very low self-esteem. Will explore origins of this esteem issue and develop strategies to resolve/manage. Goal date 3-25. Patient agreed to a video Public affairs consultant) session and is aware of the limitations of this platform. Patient is at home and provider is in home office.   Session Note: She says she does not make a "big deal" for the holidays. She finds it too stressful and prefers to "keep it low key". She says that her Thanksgiving ended up well and she worked it out with her sisters. She says the "bad thing" is how she treated her mother. She said that her mother has significantly slipped with her hygiene and she has been confronting about her. T-Giving day, she kept calling mother to shower and she would get assurance from her she was complying. In reality, her mother was not showering or caring for herself.  She says she has had some mood swings, but is stable now. About a week ago she started having some depression (in waves). No specific trigger identified. She thinks there may be a correlation between not taking supplements and getting depressed. She also had 2 surgeries on hands. Her grief is still a frequent experience.                   Garrel Ridgel, PhD 11:40a-12:30p 50 minutes

## 2023-07-12 ENCOUNTER — Ambulatory Visit (INDEPENDENT_AMBULATORY_CARE_PROVIDER_SITE_OTHER): Payer: BC Managed Care – PPO

## 2023-07-19 ENCOUNTER — Ambulatory Visit: Payer: BC Managed Care – PPO | Admitting: Psychology

## 2023-07-19 DIAGNOSIS — F4323 Adjustment disorder with mixed anxiety and depressed mood: Secondary | ICD-10-CM

## 2023-07-19 NOTE — Progress Notes (Signed)
 [facility] Counselor Initial Adult Exam  Name: Megan Cain Date: 07/19/2023 MRN: [id number] DOB: [birth year] PCP: [name], MD    Guardian/Payee:  N/A    Paperwork requested: No   Reason for Visit /Presenting Problem: mild depression and anxiety, grief, poor self-esteem  Mental Status Exam: Appearance:   Casual     Behavior:  Appropriate  Motor:  Normal  Speech/Language:   Clear and Coherent  Affect:  Appropriate  Mood:  normal  Thought process:  normal  Thought content:    WNL  Sensory/Perceptual disturbances:    WNL  Orientation:  oriented to person, place, and situation  Attention:  Good  Concentration:  Good  Memory:  WNL  Fund of knowledge:   Good  Insight:    Good  Judgment:   Good  Impulse Control:  Good     Reported Symptoms:  guilt, grief (sadness), depleted sense of self  Risk Assessment: Danger to Self:  No Self-injurious Behavior: No Danger to Others: No Duty to Warn:no Physical Aggression / Violence:No  Access to Firearms a concern: No  Gang Involvement:No  Patient / guardian was educated about steps to take if suicide or homicide risk level increases between visits: n/a While future psychiatric events cannot be accurately predicted, the patient does not currently require acute inpatient psychiatric care and does not currently meet [place]  involuntary commitment criteria.  Substance Abuse History: Current substance abuse: No     Past Psychiatric History:    No previous psychological problems have been observed Outpatient Providers:N/A History of Psych Hospitalization: No  Psychological Testing:  N/A    Abuse History:  Victim of: No.,  N/A    Report needed: No. Victim of Neglect:No. Perpetrator of  N/A   Witness / Exposure to Domestic Violence: No   Protective Services Involvement: No  Witness to [employer] Violence:  No   Family History:  Family History  Problem Relation Age of Onset   Hypertension Mother    Breast cancer Mother    Heart disease Father    Hyperlipidemia Father    Stroke Father  Leukemia Brother    Breast cancer Maternal Aunt    Diabetes Son        type 1 in both sons   Diabetes Son    Colon cancer Neg Hx    Stomach cancer Neg Hx    Esophageal cancer Neg Hx    Rectal cancer Neg Hx     Living situation: the patient lives with their spouse  Sexual Orientation: Straight  Relationship Status: married  Name of spouse / other:[name] If a parent, number of children / ages:Two boys, ages 68 and 26  Support Systems: spouse  Surveyor, Quantity Stress:  No   Income/Employment/Disability: Employment  Financial Planner: No   Educational History: Education:  unknown  Oncologist: unknown  Any cultural differences that may affect / interfere with treatment:  not applicable   Recreation/Hobbies: reading  Stressors: Marital or family conflict    Strengths: Supportive Relationships  Barriers:  undetermined   Legal History: Pending legal issue / charges: The patient has no significant history of legal issues. 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 shoulder   SQUAMOUS CELL CARCINOMA EXCISION  09/2015   Right ear   thumb surgery Left 1975    Medications: Current Outpatient Medications  Medication Sig Dispense Refill   B Complex Vitamins (B COMPLEX PO) Take 1 tablet by mouth daily.     budesonide-formoterol (SYMBICORT) 160-4.5 MCG/ACT inhaler Inhale 2 puffs into the lungs 2 (two) times daily. (Patient not taking: Reported on 10/29/2022)     Calcium Carbonate (CALTRATE 600 PO) Take 600 mg by mouth 2 (two) times daily.     cholecalciferol (VITAMIN D ) 1000 UNITS tablet Take 1,000 Units by mouth daily.     Coenzyme Q10 (CO Q 10 PO) Take 1 tablet by mouth daily.     Continuous Blood Gluc Sensor (DEXCOM G4 SENSOR) MISC by Does not apply route. G7     Cyanocobalamin (VITAMIN B-12 PO) Take 1 tablet by mouth daily.     fish oil-omega-3 fatty acids 1000 MG capsule Take 2 g by mouth daily.     fluticasone (FLONASE) 50 MCG/ACT nasal spray Place into both nostrils daily.     JARDIANCE 25 MG TABS tablet Take 25 mg by mouth daily.     lamoTRIgine  (LAMICTAL ) 100 MG tablet Take 100 mg by mouth 2 (two) times daily.     LANTUS SOLOSTAR 100 UNIT/ML Solostar Pen Inject 6 Units into the skin 2 (two) times daily.     loratadine (CLARITIN) 10 MG tablet Take 10 mg by mouth daily.     MAGNESIUM PO Take 1 tablet by mouth daily.  montelukast (SINGULAIR) 10 MG tablet Take 10 mg by mouth at bedtime.     Multiple Vitamins-Minerals (MULTIVITAMIN WITH MINERALS) tablet Take 1 tablet by mouth daily.     NOVOLOG 100 UNIT/ML injection 100u/day [place] via pump for 90 days     Probiotic Product (PROBIOTIC DAILY PO) Take 1 tablet by mouth daily.      rosuvastatin (CRESTOR) 5 MG tablet Take 5 mg by mouth daily.     thyroid  (ARMOUR) 15 MG tablet Take 15 mg by mouth daily. Takes with 60mg      thyroid  (ARMOUR) 60 MG tablet Take 60 mg by mouth daily before breakfast. Takes with 15mg      Turmeric 450 MG CAPS Take by mouth 2 (two) times daily.     No current facility-administered medications for this visit.    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 She has issues of grief separation and abandonment. Recent diagnosis of type 1 diabetic and insulin  dependent. Her sons are diabetic as well. Most recently consumed with the news that her father's wife is terminally ill. Father died a few years ago, and Leimomi had a lot of anger and resentment toward father's wife. She is angry, bitter and hurt toward this woman, who also treated [place] poorly. Says her father's closest friends also had issues with his wife. Mylynn knew this woman for almost 30 years and they are very different. They did not really connect well from the start. This woman was very controlling. Atina says I don't like people to dislike me and her step mother did not like her. Tenaya feels guilty and fears that family members have a bad perception of her because of her father's wife. In particular, her deceased brother's family had a good relationship with her step-mother because they lived so far away ([place] ).  Her parents divorced in 32 when Elizaveta was 52. They had separated twice before. It was a bitter divorce and mother did not want the divorce. Triniti has 1 deceased brother who was 3 years older and a sister 4 years younger (in [place], [place]). Brother died of complications from bone marrow transplant. Her mother in law was diagnosed with cancer around same time and passed away. Her brother was best friends with her husband and they worked together. Her  husband, father and brother worked in their own business together. Her husband  Rodman) now owns the business with [place]. 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 Eilene and Dorise married 33 years and together 8 before that. They have 2 boys. (Dylan age 65 and Griffin age 34). Both live in [place]. Signa lives with girlfriend. Both boys have diabetes. Her marital relationship is up and down. Relationship with mother: She was not a nurturing person. Zarea became very independent when her parents divorced. She wonders if mother is on spectrum. She says mom was quirky, especially socially. Mom was not affectionate and they did not share interests. Mom had lots of friends, but not with a lot of depth.  Her grief hits her really hard. Lots of loss in her life. Uncle committed suicide, brother's death and mother in law's death. Jetaun gets very sad with separation and thinks it was because of parents separations and divorce. When her mother went away to teach, it was devastating for Jerene and felt that maybe she was to blame.  Her weekly routine: Says  she has backed off because they now butt heads. So goes to see her at [facility] once a week and drives her to appointments. She now goes out to visit to help her do laundry and other tasks.  She states that depression runs in her family. She tries to focus on self-care (exercise, socialize). She is invlved in community work. No big hobbies, but likes to read. Rarely drinks (maybe weekly) and occasional weed. Mother was diagnosed with depression, father and his brother both had depression. Jaryah's sister is also prone to depression. She takes Lamotrigine  for several years. Level of depression is about a 3 of 10. Has some transient anxiety. Sleep and appetite is fine. Does mindfulness and meditation practices.           Diagnoses:  Adjustment disorder with anxiety and depressed mood  Plan of Care: outpatient psychotherapy Treatment plan/goals: Patient  seeks counseling to help manage emotions related to caring for aging mother with dementia, with whom she has a complicated history. She is also experiencing unresolved grief related to numerous losses and is seeking to work through those feelings. She also reports very low self-esteem. Will explore origins of this esteem issue and develop strategies to resolve/manage. Goal date 3-25. Patient agreed to a video Public Affairs Consultant) session and is aware of the limitations of this platform. Patient is at home and provider is in home office.   Session Note: She states that she did well through the holidays. She and husband both got Covid 19. Her grief was not extreme and says she didn't go over edge with her memories. She still struggles with the deterioration of her mother's abilities. We discussed her desire to improve self-care like her nutrition and exercise. She will work on specific plans to help insure follow-through. Part of her self-care is to set boundaries and not accept too many volunteer projects.  Towanda is still trying to deal with her mother's facility about monitoring her mother's hygiene. She has been there 2 years and there are still unresolved issues. She is going to tell facility that their lack of response has been damaging.                        CONI ALM KERNS, PhD 11:40a-12:30p 50 minutes

## 2023-08-11 ENCOUNTER — Ambulatory Visit: Payer: BC Managed Care – PPO | Admitting: Psychology

## 2023-08-11 DIAGNOSIS — F4323 Adjustment disorder with mixed anxiety and depressed mood: Secondary | ICD-10-CM

## 2023-08-11 NOTE — Progress Notes (Signed)
[facility] Counselor Initial Adult Exam  Name: Megan Cain Date: 08/11/2023 MRN: [id number] DOB: Feb 27, 1961 PCP: Dois Davenport, MD    Guardian/Payee:  N/A    Paperwork requested: No   Reason for Visit /Presenting Problem: mild depression and anxiety, grief, poor self-esteem  Mental Status Exam: Appearance:   Casual     Behavior:  Appropriate  Motor:  Normal  Speech/Language:   Clear and Coherent  Affect:  Appropriate  Mood:  normal  Thought process:  normal  Thought content:    WNL  Sensory/Perceptual disturbances:    WNL  Orientation:  oriented to person, place, and situation  Attention:  Good  Concentration:  Good  Memory:  WNL  Fund of knowledge:   Good  Insight:    Good  Judgment:   Good  Impulse Control:  Good     Reported Symptoms:  guilt, grief (sadness), depleted sense of self  Risk Assessment: Danger to Self:  No Self-injurious Behavior: No Danger to Others: No Duty to Warn:no Physical Aggression / Violence:No  Access to Firearms a concern: No  Gang Involvement:No  Patient / guardian was educated about steps to take if suicide or homicide risk level increases between visits: n/a While future psychiatric events cannot be accurately predicted, the patient does not currently require acute inpatient psychiatric care and does not currently meet [facility] involuntary commitment criteria.  Substance Abuse History: Current substance abuse: No     Past Psychiatric History:    No previous psychological problems have been observed Outpatient Providers:N/A History of Psych Hospitalization: No  Psychological Testing:  N/A    Abuse History:  Victim of: No.,  N/A    Report needed: No. Victim of Neglect:No. Perpetrator of  N/A   Witness / Exposure to Domestic Violence: No   Protective Services Involvement: No  Witness to [employer] Violence:  No   Family History:  Family History  Problem Relation Age of Onset   Hypertension Mother    Breast cancer Mother    Heart disease Father    Hyperlipidemia Father    Stroke Father  Leukemia Brother    Breast cancer Maternal Aunt    Diabetes Son        type 1 in both sons   Diabetes Son    Colon cancer Neg Hx    Stomach cancer Neg Hx    Esophageal cancer Neg Hx    Rectal cancer Neg Hx     Living situation: the patient lives with their spouse  Sexual Orientation: Straight  Relationship Status: married  Name of spouse / other:[name] If a parent, number of children / ages:Two boys, ages 53 and 103  Support Systems: spouse  Surveyor, quantity Stress:  No   Income/Employment/Disability: Employment  Financial planner: No   Educational History: Education:  unknown  Oncologist: unknown  Any cultural differences that may affect / interfere with treatment:  not applicable   Recreation/Hobbies: reading  Stressors: Marital or family conflict    Strengths: Supportive Relationships  Barriers:  undetermined   Legal History: Pending legal issue / charges: The patient has no significant history of legal issues. 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 shoulder   SQUAMOUS CELL CARCINOMA EXCISION  09/2015   Right ear   thumb surgery Left 1975    Medications: Current Outpatient Medications  Medication Sig Dispense Refill   B Complex Vitamins (B COMPLEX PO) Take 1 tablet by mouth daily.     budesonide-formoterol (SYMBICORT) 160-4.5 MCG/ACT inhaler Inhale 2 puffs into the lungs 2 (two) times daily. (Patient not taking: Reported on 10/29/2022)     Calcium Carbonate (CALTRATE 600 PO) Take 600 mg by mouth 2 (two) times daily.     cholecalciferol (VITAMIN D) 1000 UNITS tablet Take 1,000 Units by mouth daily.     Coenzyme Q10 (CO Q 10 PO) Take 1 tablet by mouth daily.     Continuous Blood Gluc Sensor (DEXCOM G4 SENSOR) MISC by Does not apply route. G7     Cyanocobalamin (VITAMIN B-12 PO) Take 1 tablet by mouth daily.     fish oil-omega-3 fatty acids 1000 MG capsule Take 2 g by mouth daily.     fluticasone (FLONASE) 50 MCG/ACT nasal spray Place into both nostrils daily.     JARDIANCE 25 MG TABS tablet Take 25 mg by mouth daily.     lamoTRIgine (LAMICTAL) 100 MG tablet Take 100 mg by mouth 2 (two) times daily.     LANTUS SOLOSTAR 100 UNIT/ML Solostar Pen Inject 6 Units into the skin 2 (two) times daily.     loratadine (CLARITIN) 10 MG tablet Take 10 mg by mouth daily.     MAGNESIUM PO Take 1 tablet by mouth daily.  montelukast (SINGULAIR) 10 MG tablet Take 10 mg by mouth at bedtime.     Multiple Vitamins-Minerals (MULTIVITAMIN WITH MINERALS) tablet Take 1 tablet by mouth daily.     NOVOLOG 100 UNIT/ML injection 100u/day [place] via pump for 90 days     Probiotic Product (PROBIOTIC DAILY PO) Take 1 tablet by mouth daily.      rosuvastatin (CRESTOR) 5 MG tablet Take 5 mg by mouth daily.     thyroid (ARMOUR) 15 MG tablet Take 15 mg by mouth daily. Takes with 60mg      thyroid (ARMOUR) 60 MG tablet Take 60 mg by mouth daily before breakfast. Takes with 15mg      Turmeric 450 MG CAPS Take by mouth 2 (two) times daily.     No current facility-administered medications for this visit.    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 She has issues of grief separation and abandonment. Recent diagnosis of type 1 diabetic and insulin dependent. Her sons are diabetic as well. Most recently consumed with the news that her father's wife is terminally ill. Father died a few years ago, and Ayde had a lot of anger and resentment toward father's wife. She is angry, bitter and hurt toward this woman, who also treated [place] poorly. Says her father's closest friends also had issues with his wife. Zawadi knew this woman for almost 30 years and they are very different. They did not really connect well from the start. This woman was very controlling. Ronit says "I don't like people to dislike me and her step mother did not like her. Sherita feels guilty and fears that family members have a bad perception of her because of her father's wife. In particular, her deceased brother's family had a good relationship with her step-mother because they lived so far away [facility]).  Her parents divorced in 65 when Lacrisha was 12. They had separated twice before. It was a bitter divorce and mother did not want the divorce. Cambelle has 1 deceased brother who was 3 years older and a sister 4 years younger (in [place], [place]). Brother died of complications from bone marrow transplant. Her mother in law was diagnosed with cancer around same time and passed away. Her brother was best friends with her husband and they worked together. Her  husband, father and brother worked in their own business together. Her husband  Judie Grieve) now owns the business with [employer] [place]. 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 Kegan and Judie Grieve married 33 years and together 8 before that. They have 2 boys. (Dylan age 62 and [place] age 60). Both live in [place]. Valentina Lucks lives with girlfriend. Both boys have diabetes. Her marital relationship is "up and down". Relationship with mother: "She was not a nurturing person". Yaqueline became very independent when her parents divorced. She wonders if mother is on spectrum. She says mom was quirky, especially socially. Mom was not affectionate and they did not share interests. Mom had lots of friends, but not with a lot of depth.  Her "grief" hits her "really hard". Lots of loss in her life. Uncle committed suicide, brother's death and mother in law's death. Damita gets very sad with separation and thinks it was because of parents separations and divorce. When her mother went away to teach, it was devastating for Lulubelle and felt that maybe she was to blame.  Her weekly routine: Says  she has backed off because they now "butt heads". So [facility] at [facility] once a week and drives her to appointments. She now goes out to visit to help her do laundry and other tasks.  She states that depression runs in her family. She tries to focus on self-care (exercise, socialize). She is invlved in community work. No big hobbies, but likes to read. Rarely drinks (maybe weekly) and occasional weed. Mother was diagnosed with depression, father and his brother both had depression. Timesha's sister is also prone to depression. She takes Lamotrigine for several years. Level of depression is about a 3 of 10. Has some transient anxiety. Sleep and appetite is fine. Does mindfulness and meditation practices.           Diagnoses:  Adjustment disorder with anxiety and depressed mood  Plan of Care: outpatient psychotherapy Treatment plan/goals: Patient  seeks counseling to help manage emotions related to caring for aging mother with dementia, with whom she has a complicated history. She is also experiencing unresolved grief related to numerous losses and is seeking to work through those feelings. She also reports very low self-esteem. Will explore origins of this esteem issue and develop strategies to resolve/manage. Goal date 3-25. Patient agreed to be seen in provider's office.   Session Note: Letta says that she thinks she made some progress with her mother's care at the facility. Khalani had a meeting with them and was told they have a better strategy to improve her mother's hygiene. She feels better about the situation, but says it is still not ideal. She says that the recovery in her hands is not going especially well. The discomfort has been upsetting in a number of ways. She talked about her ADD and how it is disruptive in her life. Hard to follow directions, misplaces things and is easily overwhelmed and fatigued. She doesn't feel like herself these days. Also says she is "in and out of feeling depressed", but less so during past weeks. We talked about getting evaluated for ADHD and consider medicine. Referred her to [facility]. If needed we can have her evaluated for ADD in our office.                        Garrel Ridgel, PhD 10:40a-11:30a 50 minutes

## 2023-08-12 ENCOUNTER — Telehealth (INDEPENDENT_AMBULATORY_CARE_PROVIDER_SITE_OTHER): Payer: Self-pay | Admitting: Otolaryngology

## 2023-08-12 NOTE — Telephone Encounter (Signed)
LVM to confirm appt & location [id number] afm

## 2023-08-13 ENCOUNTER — Ambulatory Visit (INDEPENDENT_AMBULATORY_CARE_PROVIDER_SITE_OTHER): Payer: BC Managed Care – PPO | Admitting: Otolaryngology

## 2023-08-13 VITALS — BP 155/88 | HR 60 | Ht 63.0 in | Wt 148.0 lb

## 2023-08-13 DIAGNOSIS — J310 Chronic rhinitis: Secondary | ICD-10-CM | POA: Diagnosis not present

## 2023-08-13 DIAGNOSIS — R0981 Nasal congestion: Secondary | ICD-10-CM | POA: Diagnosis not present

## 2023-08-13 DIAGNOSIS — J343 Hypertrophy of nasal turbinates: Secondary | ICD-10-CM

## 2023-08-14 DIAGNOSIS — J310 Chronic rhinitis: Secondary | ICD-10-CM | POA: Insufficient documentation

## 2023-08-14 DIAGNOSIS — J343 Hypertrophy of nasal turbinates: Secondary | ICD-10-CM | POA: Insufficient documentation

## 2023-08-14 NOTE — Progress Notes (Signed)
Patient ID: Megan Cain, female   DOB: [birth year], 63 y.o.   MRN: [id number]  Follow-up: Chronic nasal obstruction  HPI: The patient is a 63 year old female who returns today for her follow-up evaluation.  The patient was previously seen for chronic nasal obstruction.  She underwent septoplasty and turbinate reduction surgery in June 2024.  According to the patient, her nasal breathing has significantly improved.  She has noted only mild nasal congestion occasionally.  She denies any facial pain or fever.  Exam: General: Communicates without difficulty, well nourished, no acute distress. Head: Normocephalic, no evidence injury, no tenderness, facial buttresses intact without stepoff. Face/sinus: No tenderness to palpation and percussion. Facial movement is normal and symmetric. Eyes: PERRL, EOMI. No scleral icterus, conjunctivae clear. Neuro: CN II exam reveals vision grossly intact.  No nystagmus at any point of gaze. Ears: Auricles well formed without lesions.  Ear canals are intact without mass or lesion.  No erythema or edema is appreciated.  The TMs are intact without fluid. Nose: External evaluation reveals normal support and skin without lesions.  Dorsum is intact.  Anterior rhinoscopy reveals congested mucosa over anterior aspect of inferior turbinates and intact septum.  No purulence noted. Oral:  Oral cavity and oropharynx are intact, symmetric, without erythema or edema.  Mucosa is moist without lesions. Neck: Full range of motion without pain.  There is no significant lymphadenopathy.  No masses palpable.  Thyroid bed within normal limits to palpation.  Parotid glands and submandibular glands equal bilaterally without mass.  Trachea is midline. Neuro:  CN 2-12 grossly intact.   Assessment: 1.  Chronic rhinitis with mild nasal mucosal congestion. 2.  Her septum and turbinates are well-healed.  Her nasal passageways are patent bilaterally.  Plan: 1.  The physical exam findings are  reviewed with the patient. 2.  Flonase nasal spray and nasal saline irrigation as needed. 3.  The patient is encouraged to call with any questions or concerns.

## 2023-09-01 ENCOUNTER — Ambulatory Visit: Payer: BC Managed Care – PPO | Admitting: Psychology

## 2023-09-07 ENCOUNTER — Ambulatory Visit (INDEPENDENT_AMBULATORY_CARE_PROVIDER_SITE_OTHER): Payer: BC Managed Care – PPO | Admitting: Behavioral Health

## 2023-09-07 ENCOUNTER — Other Ambulatory Visit: Payer: Self-pay | Admitting: Medical Genetics

## 2023-09-07 ENCOUNTER — Encounter: Payer: Self-pay | Admitting: Behavioral Health

## 2023-09-07 VITALS — BP 127/80 | HR 79 | Ht 63.0 in | Wt 146.0 lb

## 2023-09-07 DIAGNOSIS — F331 Major depressive disorder, recurrent, moderate: Secondary | ICD-10-CM | POA: Diagnosis not present

## 2023-09-07 DIAGNOSIS — F900 Attention-deficit hyperactivity disorder, predominantly inattentive type: Secondary | ICD-10-CM

## 2023-09-07 DIAGNOSIS — F411 Generalized anxiety disorder: Secondary | ICD-10-CM

## 2023-09-07 MED ORDER — AMPHETAMINE-DEXTROAMPHETAMINE 10 MG PO TABS: 10.0000 mg | ORAL_TABLET | Freq: Every day | ORAL | 0 refills | Status: DC

## 2023-09-07 NOTE — Progress Notes (Signed)
 [facility] MD/PA/NP Initial Note  09/07/2023 5:27 PM Megan Cain  MRN:  [id number]  Chief Complaint:  Chief Complaint   Anxiety; Depression; ADHD; Establish Care; Patient Education     HPI:  "Megan Cain", 63 year old female presents to this office for initial visit and to establish care.  Collateral information should be considered reliable.  Patient states that she is here today to discuss possible ADHD diagnosis also her long history of anxiety and depression.  Patient states that she has struggled with attention and focus problems for a very long time and she remembers struggling all throughout school.  She did start and stop masters degree and never went back to complete.  Says that she has problems staying on track with chores or task.  Has a hard time completing projects.  Reports having a hard time watching a movie, working or reading.  During today's visit she was occasionally distracted by movement outside of the office.  Her pH Q9 score was 10.  Her MDQ was negative but did endorse frequent irritability, racing thoughts, and lack of concentration.  Says that she does sleep 7 to 8 hours per night but does have to get up in the middle of the night to urinate.  She does believe that her lack of attention and frustration adds to her depressive symptoms.  She has been married for 32 years and lives in [place] with husband.  She does not work a regular job but is regularly engaged with Theme park manager.  Denies any history of mania, no psychosis, no auditory or visual hallucinations or delirium.  Denies SI or HI.  Past psychiatric medication trials: Zoloft Lamictal   Visit Diagnosis:    ICD-10-CM   1. Generalized anxiety disorder  F41.1     2. Major depressive disorder, recurrent episode, moderate (HCC)  F33.1     3. 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 shoulder   SQUAMOUS CELL CARCINOMA EXCISION  09/2015   Right ear   thumb surgery Left 1975    Family Psychiatric History: see chart  Family History:  Family History  Problem Relation Age of Onset   Dementia Mother    Hypertension Mother    Breast cancer Mother    Dementia Father    Heart disease Father    Hyperlipidemia Father    Stroke Father    Depression Sister    Anxiety disorder Sister    Leukemia Brother    Breast cancer Maternal Aunt    Suicidality Paternal Uncle  Diabetes Son        type 1 in both sons   Diabetes Son    Colon cancer Neg Hx    Stomach cancer Neg Hx    Esophageal cancer Neg Hx    Rectal cancer Neg Hx     Social History:  Social History   Socioeconomic History   Marital status: Married    Spouse name: Judie Grieve   Number of children: 2   Years of education: Not on file   Highest education level: Bachelor's degree (e.g., BA, AB, BS)  Occupational History   Occupation: Retired  Tobacco Use   Smoking status: Never   Smokeless tobacco: Never  Vaping Use   Vaping status: Never Used  Substance and Sexual Activity   Alcohol use: Yes    Alcohol/week: 1.0 standard drink of alcohol    Types: 1 Cans of beer per week    Comment: one drink per week   Drug use: No   Sexual activity:  Yes    Partners: Male    Birth control/protection: Post-menopausal  Other Topics Concern   Not on file  Social History Narrative   Lives in [place] with husband.    Social Drivers of Corporate investment banker Strain: Not on file  Food Insecurity: Not on file  Transportation Needs: Not on file  Physical Activity: Not on file  Stress: Not on file  Social Connections: Not on file    Allergies:  Allergies  Allergen Reactions   Lisinopril Other (See Comments) and Cough    cough    Metabolic Disorder Labs: No results found for: "HGBA1C", "MPG" No results found for: "PROLACTIN" No results found for: "CHOL", "TRIG", "HDL", "CHOLHDL", "VLDL", "LDLCALC" No results found for: "TSH"  Therapeutic Level Labs: No results found for: "LITHIUM" No results found for: "VALPROATE" No results found for: "CBMZ"  Current Medications: Current Outpatient Medications  Medication Sig Dispense Refill   amphetamine-dextroamphetamine (ADDERALL) 10 MG tablet Take 1 tablet (10 mg total) by mouth daily with breakfast. 30 tablet 0   B Complex Vitamins (B COMPLEX PO) Take 1 tablet by mouth daily.     budesonide-formoterol (SYMBICORT) 160-4.5 MCG/ACT inhaler Inhale 2 puffs into the lungs 2 (two) times daily. (Patient not taking: Reported on 10/29/2022)     Calcium Carbonate (CALTRATE 600 PO) Take 600 mg by mouth 2 (two) times daily.     cholecalciferol (VITAMIN D) 1000 UNITS tablet Take 1,000 Units by mouth daily.     Coenzyme Q10 (CO Q 10 PO) Take 1 tablet by mouth daily.     Continuous Blood Gluc Sensor (DEXCOM G4 SENSOR) MISC by Does not apply route. G7     Cyanocobalamin (VITAMIN B-12 PO) Take 1 tablet by mouth daily.     fish oil-omega-3 fatty acids 1000 MG capsule Take 2 g by mouth daily.     fluticasone (FLONASE) 50 MCG/ACT nasal spray Place into both nostrils daily.     JARDIANCE 25 MG TABS tablet Take 25 mg by mouth daily.     lamoTRIgine (LAMICTAL) 100 MG tablet Take 100 mg by mouth 2  (two) times daily.     LANTUS SOLOSTAR 100 UNIT/ML Solostar Pen Inject 6 Units into the skin 2 (two) times daily.     loratadine (CLARITIN) 10 MG tablet Take 10 mg by mouth daily.     MAGNESIUM PO Take 1 tablet by mouth daily.     montelukast (SINGULAIR) 10 MG tablet Take 10 mg by mouth at  bedtime.     Multiple Vitamins-Minerals (MULTIVITAMIN WITH MINERALS) tablet Take 1 tablet by mouth daily.     NOVOLOG 100 UNIT/ML injection 100u/day [place] via pump for 90 days     Probiotic Product (PROBIOTIC DAILY PO) Take 1 tablet by mouth daily.     rosuvastatin (CRESTOR) 5 MG tablet Take 5 mg by mouth daily.     thyroid (ARMOUR) 15 MG tablet Take 15 mg by mouth daily. Takes with 60mg      thyroid (ARMOUR) 60 MG tablet Take 60 mg by mouth daily before breakfast. Takes with 15mg      Turmeric 450 MG CAPS Take by mouth 2 (two) times daily.     No current facility-administered medications for this visit.    Medication Side Effects: none  Orders placed this visit:  No orders of the defined types were placed in this encounter.   Psychiatric Specialty Exam:  Review of Systems  Constitutional: Negative.   Allergic/Immunologic: Negative.   Neurological: Negative.   Psychiatric/Behavioral:  Positive for decreased concentration and dysphoric mood.     Blood pressure 127/80, pulse 79, height 5\' 3"  (1.6 m), weight 146 lb (66.2 kg), last menstrual period 02/21/2015.Body mass index is 25.86 kg/m.  General Appearance: Casual, Neat, and Well Groomed  Eye Contact:  Good  Speech:  Clear and Coherent  Volume:  Normal  Mood:  Dysphoric  Affect:  Congruent and Depressed  Thought Process:  Coherent  Orientation:  Full (Time, Place, and Person)  Thought Content: Logical   Suicidal Thoughts:  No  Homicidal Thoughts:  No  Memory:  WNL  Judgement:  Good  Insight:  Good  Psychomotor Activity:  Normal  Concentration:  Concentration: Fair  Recall:  Good  Fund of Knowledge: Good  Language: Good  Assets:  Desire  for Improvement  ADL's:  Intact  Cognition: WNL  Prognosis:  Good   Screenings:  PHQ2-9    Flowsheet Row Office Visit from 09/07/2023 in [facility]  PHQ-2 Total Score 2  PHQ-9 Total Score 10      Flowsheet Row ED from 03/08/2022 in [facility] at [facility]  C-SSRS RISK CATEGORY No Risk       Receiving Psychotherapy: No   Treatment Plan/Recommendations:  Greater than 50% of 60 min face to face time with patient was spent on counseling and coordination of care. We discussed her long-term problems with anxiety and depression stemming back to her early 30s.  We talked about her concerns today with attention and focus problems.  She has been having difficulty staying on task, completing projects, which she reports is intertwined with her depressive symptoms.  We talked about family dynamics, past plan of care and previous medication trials.  Conducted adult self-report screening questionnaire today.  Scores are as follows: Part A-33 Part B-9 This screening tool can be indicated if of ADHD if scores on either part A or part B are 19 or higher.   We agreed today to: Will start Adderall 10 mg daily after breakfast Will continue Lamictal 100 mg daily Will report side effects or worsening symptoms promptly It was recommended that patient monitor weights and blood pressure consistently Will follow-up to this office in 4 weeks to reassess Provided emergency contact information Discussed potential benefits, risks, and side effects of stimulants with patient to include increased heart rate, palpitations, insomnia, increased anxiety, increased irritability, or decreased appetite.  Instructed patient to contact office if experiencing any significant tolerability issues.  Reviewed PDMP   Joan Flores, NP

## 2023-09-09 ENCOUNTER — Encounter: Payer: Self-pay | Admitting: Obstetrics and Gynecology

## 2023-09-09 DIAGNOSIS — Z780 Asymptomatic menopausal state: Secondary | ICD-10-CM

## 2023-09-09 DIAGNOSIS — M8580 Other specified disorders of bone density and structure, unspecified site: Secondary | ICD-10-CM

## 2023-09-20 ENCOUNTER — Ambulatory Visit (HOSPITAL_BASED_OUTPATIENT_CLINIC_OR_DEPARTMENT_OTHER)
Admission: RE | Admit: 2023-09-20 | Discharge: 2023-09-20 | Disposition: A | Discharge status: Home / Self Care | Payer: BC Managed Care – PPO | Source: Ambulatory Visit | Attending: Obstetrics and Gynecology | Admitting: Obstetrics and Gynecology

## 2023-09-20 ENCOUNTER — Encounter: Payer: Self-pay | Admitting: Obstetrics and Gynecology

## 2023-09-20 DIAGNOSIS — M8580 Other specified disorders of bone density and structure, unspecified site: Secondary | ICD-10-CM | POA: Insufficient documentation

## 2023-09-20 DIAGNOSIS — Z780 Asymptomatic menopausal state: Secondary | ICD-10-CM | POA: Insufficient documentation

## 2023-09-22 ENCOUNTER — Ambulatory Visit: Payer: BC Managed Care – PPO | Admitting: Psychology

## 2023-09-22 DIAGNOSIS — F4323 Adjustment disorder with mixed anxiety and depressed mood: Secondary | ICD-10-CM

## 2023-09-22 DIAGNOSIS — F411 Generalized anxiety disorder: Secondary | ICD-10-CM

## 2023-09-22 NOTE — Progress Notes (Signed)
 [facility] Counselor Initial Adult Exam  Name: Megan Cain [facility] Date: 09/22/2023 MRN: [id number] DOB: 1960/08/04 PCP: Dois Davenport, MD    Guardian/Payee:  N/A    Paperwork requested: No   Reason for Visit /Presenting Problem: mild depression and anxiety, grief, poor self-esteem  Mental Status Exam: Appearance:   Casual     Behavior:  Appropriate  Motor:  Normal  Speech/Language:   Clear and Coherent  Affect:  Appropriate  Mood:  normal  Thought process:  normal  Thought content:    WNL  Sensory/Perceptual disturbances:    WNL  Orientation:  oriented to person, place, and situation  Attention:  Good  Concentration:  Good  Memory:  WNL  Fund of knowledge:   Good  Insight:    Good  Judgment:   Good  Impulse Control:  Good     Reported Symptoms:  guilt, grief (sadness), depleted sense of self  Risk Assessment: Danger to Self:  No Self-injurious Behavior: No Danger to Others: No Duty to Warn:no Physical Aggression / Violence:No  Access to Firearms a concern: No  Gang Involvement:No  Patient / guardian was educated about steps to take if suicide or homicide risk level increases between visits: n/a While future psychiatric events cannot be accurately predicted, the patient does not currently require acute inpatient psychiatric care and does not currently meet [facility] involuntary commitment criteria.  Substance Abuse History: Current substance abuse: No      Past Psychiatric History:   No previous psychological problems have been observed Outpatient Providers:N/A History of Psych Hospitalization: No  Psychological Testing:  N/A    Abuse History:  Victim of: No.,  N/A    Report needed: No. Victim of Neglect:No. Perpetrator of  N/A   Witness / Exposure to Domestic Violence: No   Protective Services Involvement: No  Witness to [employer] Violence:  No   Family History:  Family History  Problem Relation Age of Onset   Dementia Mother    Hypertension Mother    Breast cancer  Mother    Dementia Father    Heart disease Father    Hyperlipidemia Father    Stroke Father    Depression Sister    Anxiety disorder Sister    Leukemia Brother    Breast cancer Maternal Aunt    Suicidality Paternal Uncle    Diabetes Son        type 1 in both sons   Diabetes Son    Colon cancer Neg Hx    Stomach cancer Neg Hx    Esophageal cancer Neg Hx    Rectal cancer Neg Hx     Living situation: the patient lives with their spouse  Sexual Orientation: Straight  Relationship Status: married  Name of spouse / other:[name] If a parent, number of children / ages:Two boys, ages 52 and 34  Support Systems: spouse  Surveyor, quantity Stress:  No   Income/Employment/Disability: Employment  Financial planner: No   Educational History: Education:  unknown  Oncologist: unknown  Any cultural differences that may affect / interfere with treatment:  not applicable   Recreation/Hobbies: reading  Stressors: Marital or family conflict    Strengths: Supportive Relationships  Barriers:  undetermined   Legal History: Pending legal issue / charges: The patient has no significant history of legal issues. 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 shoulder   SQUAMOUS CELL CARCINOMA EXCISION  09/2015   Right ear   thumb surgery Left 1975    Medications: Current Outpatient Medications  Medication Sig Dispense Refill   amphetamine-dextroamphetamine (ADDERALL) 10 MG tablet Take 1 tablet (10 mg total) by mouth daily with breakfast. 30 tablet 0   B Complex Vitamins (B COMPLEX PO) Take 1 tablet by mouth daily.     budesonide-formoterol (SYMBICORT) 160-4.5 MCG/ACT inhaler Inhale 2 puffs into the lungs 2 (two) times daily. (Patient not taking: Reported on 10/29/2022)     Calcium Carbonate (CALTRATE 600 PO) Take 600 mg by mouth 2 (two) times daily.     cholecalciferol (VITAMIN D) 1000 UNITS tablet Take 1,000 Units by mouth daily.     Coenzyme Q10 (CO Q 10 PO) Take 1 tablet by mouth daily.     Continuous Blood Gluc Sensor (DEXCOM G4 SENSOR) MISC by Does not apply route. G7     Cyanocobalamin (VITAMIN B-12 PO) Take 1 tablet by mouth daily.     fish oil-omega-3 fatty acids 1000 MG capsule Take 2 g by mouth daily.     fluticasone (FLONASE) 50 MCG/ACT nasal spray Place into both nostrils daily.     JARDIANCE 25 MG TABS tablet Take 25 mg by mouth daily.  lamoTRIgine (LAMICTAL) 100 MG tablet Take 100 mg by mouth 2 (two) times daily.     LANTUS SOLOSTAR 100 UNIT/ML Solostar Pen Inject 6 Units into the skin 2 (two) times daily.     loratadine (CLARITIN) 10 MG tablet Take 10 mg by mouth daily.     MAGNESIUM PO Take 1 tablet by mouth daily.     montelukast (SINGULAIR) 10 MG tablet  Take 10 mg by mouth at bedtime.     Multiple Vitamins-Minerals (MULTIVITAMIN WITH MINERALS) tablet Take 1 tablet by mouth daily.     NOVOLOG 100 UNIT/ML injection 100u/day [place] via pump for 90 days     Probiotic Product (PROBIOTIC DAILY PO) Take 1 tablet by mouth daily.     rosuvastatin (CRESTOR) 5 MG tablet Take 5 mg by mouth daily.     thyroid (ARMOUR) 15 MG tablet Take 15 mg by mouth daily. Takes with 60mg      thyroid (ARMOUR) 60 MG tablet Take 60 mg by mouth daily before breakfast. Takes with 15mg      Turmeric 450 MG CAPS Take by mouth 2 (two) times daily.     No current facility-administered medications for this visit.    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 She has issues of grief separation and abandonment. Recent diagnosis of type 1 diabetic and insulin dependent. Her sons are diabetic as well. Most recently consumed with the news that her father's wife is terminally ill. Father died a few years ago, and Tamiya had a lot of anger and resentment toward father's wife. She is angry, bitter and hurt toward this woman, who also treated [employer] poorly. Says her father's closest friends also had issues with his wife. Getsemani knew this woman for almost 30 years and they are very different. They did not really connect well from the start. This woman was very controlling. Kimberl says "I don't like people to dislike me and her step mother did not like her. Anuoluwapo feels guilty and fears that family members have a bad perception of her because of her father's wife. In particular, her deceased brother's family had a good relationship with her step-mother because they lived so far away [facility]).  Her parents divorced in 6 when Shatisha was 65. They had separated twice before. It was a bitter divorce and mother did not want the divorce. Rosea has 1 deceased brother who was 3  years older and a sister 4 years younger (in [place], [place]). Brother died of complications from bone marrow transplant. Her mother in law was diagnosed with cancer around same time and passed away. Her brother was best friends with her husband and they worked together. Her husband, father and brother worked in their own business together. Her husband  Judie Grieve) now owns the business with [employer]. 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 Iasha and Judie Grieve married 33 years and together 8 before that. They have 2 boys. (Dylan age 80 and [place] age 55). Both live in [place]. Valentina Lucks lives with girlfriend. Both boys have diabetes. Her marital relationship is "up and down". Relationship with mother: "She was not a nurturing person". Kerston became very independent when her parents divorced. She wonders if mother is on spectrum. She says mom was quirky, especially socially. Mom was not affectionate and they did not share interests. Mom had lots of friends, but not with a lot of  depth.  Her "grief" hits her "really hard". Lots of loss in her life. Uncle committed suicide, brother's death and mother in law's death. Shunta gets very sad with separation and thinks it was because of parents separations and divorce. When her mother went away to teach, it was devastating for Oriel and felt that maybe she was to blame.  Her weekly routine: Says she has backed off because they now "butt heads". So goes to see [facility] at [facility] once a week and drives her to appointments. She now goes out to visit to help her do laundry and other tasks.  She states that depression runs in her family. She tries to focus on self-care (exercise, socialize). She is invlved in community work. No big hobbies, but likes to read. Rarely drinks (maybe weekly) and occasional weed. Mother was diagnosed with depression, father and his brother both had depression. Matison's sister is also prone to depression. She takes Lamotrigine for several years. Level of  depression is about a 3 of 10. Has some transient anxiety. Sleep and appetite is fine. Does mindfulness and meditation practices.           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 She is also experiencing unresolved grief related to numerous losses and is seeking to work through those feelings. She also reports very low self-esteem. Will explore origins of this esteem issue and develop strategies to resolve/manage. Goal date 3-25. Patient agreed to be seen in provider's office.   Session Note: Khilee says she followed up from last session and went to [facility] and got Adderall for ADHD. Has been on it 2 weeks and says it was initially helpful, but thinks she needs to have dose adjusted upward. Is not sleeping well at night, but that was before medicine as well. Will see Drs. Office again in 2 weeks. She says that she is very disappointed that she has another problem with her hands that is a result of her last surgery. 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 Will need physical therapy stretching and strengthening exercises and might need some surgery. In addition to this situation, she is having some "issues" related to her mother. The ongoing issue with her mother has been hygiene (she resists taking showers). Denver says that she told mother she has to distance from the situation of struggling with her to take a shower. Oliviana thinks that her mother is being manipulative. Lavaya says "this is my relationship with my mother and this is sad". She doesn't feel the deep love for her mother. She struggles to have loving feelings toward her mom. Talked about how to manage mother and her feelings toward her.                            Garrel Ridgel, PhD 10:40a-11:30a 50 minutes

## 2023-10-04 ENCOUNTER — Ambulatory Visit (INDEPENDENT_AMBULATORY_CARE_PROVIDER_SITE_OTHER): Admitting: Behavioral Health

## 2023-10-04 ENCOUNTER — Encounter: Payer: Self-pay | Admitting: Behavioral Health

## 2023-10-04 DIAGNOSIS — F900 Attention-deficit hyperactivity disorder, predominantly inattentive type: Secondary | ICD-10-CM | POA: Diagnosis not present

## 2023-10-04 DIAGNOSIS — F411 Generalized anxiety disorder: Secondary | ICD-10-CM | POA: Diagnosis not present

## 2023-10-04 DIAGNOSIS — F331 Major depressive disorder, recurrent, moderate: Secondary | ICD-10-CM

## 2023-10-04 MED ORDER — LAMOTRIGINE 100 MG PO TABS: 100.0000 mg | ORAL_TABLET | Freq: Two times a day (BID) | ORAL | 1 refills | Status: DC

## 2023-10-04 MED ORDER — AMPHETAMINE-DEXTROAMPHETAMINE 20 MG PO TABS: 20.0000 mg | ORAL_TABLET | Freq: Every day | ORAL | 0 refills | Status: DC

## 2023-10-04 NOTE — Progress Notes (Signed)
 [facility]  Patient ID: Megan Cain,  MRN: [id number]  PCP: Dois Davenport, MD  Date of Evaluation: 10/04/2023 Time spent:30 minutes  Chief Complaint:  Chief Complaint   Depression; Anxiety; Follow-up; ADHD; Medication Problem; Patient Education    HISTORY/CURRENT STATUS: HPI  "Megan Cain", 63 year old female presents to this office for follow up and medication management.  Collateral information should be considered reliable. Says that she does noticed about 40% improvement with attention and focus but still struggling with some depression. She would like to consider increasing her Adderall this visit but does not want to add any other medication at this time until we fine tune the Adderall. Rates depression at 4/10 and anxiety at 3/10. Sleep has been ok but inconsistent.  Denies any history of mania, no psychosis, no auditory or visual hallucinations or delirium.  Denies SI or HI.   Past psychiatric medication trials: Zoloft Lamictal Individual Medical History/ Review of Systems: Changes? :No   Allergies: Lisinopril  Current Medications:  Current Outpatient Medications:    amphetamine-dextroamphetamine (ADDERALL) 20 MG tablet, Take 1 tablet (20 mg total) by mouth daily., Disp: 30 tablet, Rfl: 0   amphetamine-dextroamphetamine (ADDERALL) 10 MG tablet, Take 1 tablet (10 mg total) by mouth daily with breakfast., Disp: 30 tablet, Rfl: 0   B Complex Vitamins (B COMPLEX PO), Take 1 tablet by mouth daily., Disp: , Rfl:    budesonide-formoterol (SYMBICORT) 160-4.5 MCG/ACT inhaler, Inhale 2 puffs into the lungs 2 (two) times daily. (Patient not taking: Reported on 10/29/2022), Disp: , Rfl:    Calcium Carbonate (CALTRATE 600 PO), Take 600 mg by mouth 2 (two) times daily., Disp: , Rfl:    cholecalciferol (VITAMIN D) 1000 UNITS tablet, Take 1,000 Units by mouth daily., Disp: , Rfl:    Coenzyme Q10 (CO Q 10 PO), Take 1 tablet by mouth daily., Disp: , Rfl:    Continuous  Blood Gluc Sensor (DEXCOM G4 SENSOR) MISC, by Does not apply route. G7, Disp: , Rfl:    Cyanocobalamin (VITAMIN B-12 PO), Take 1 tablet by mouth daily., Disp: , Rfl:    fish oil-omega-3 fatty acids 1000 MG capsule, Take 2 g by mouth daily., Disp: , Rfl:    fluticasone (FLONASE) 50 MCG/ACT nasal spray, Place into both nostrils daily., Disp: , Rfl:    JARDIANCE 25 MG TABS tablet, Take 25 mg by mouth daily., Disp: , Rfl:    lamoTRIgine (LAMICTAL) 100 MG tablet, Take 1 tablet (100 mg total) by mouth 2 (two) times daily., Disp: 90 tablet, Rfl: 1   LANTUS SOLOSTAR 100 UNIT/ML Solostar Pen, Inject 6 Units into the skin 2 (two) times daily., Disp: , Rfl:    loratadine (CLARITIN) 10 MG tablet, Take 10 mg by mouth daily., Disp: , Rfl:    MAGNESIUM PO, Take 1 tablet by mouth daily., Disp: , Rfl:    montelukast (SINGULAIR) 10 MG tablet, Take 10 mg by mouth at bedtime., Disp: , Rfl:    Multiple Vitamins-Minerals (MULTIVITAMIN WITH MINERALS) tablet, Take 1 tablet by mouth daily., Disp: , Rfl:    NOVOLOG 100 UNIT/ML injection, 100u/day [place] via pump for 90 days, Disp: , Rfl:    Probiotic Product (PROBIOTIC DAILY PO), Take 1 tablet by mouth daily., Disp: , Rfl:    rosuvastatin (CRESTOR) 5 MG tablet, Take 5 mg by mouth daily., Disp: , Rfl:    thyroid (ARMOUR) 15 MG tablet, Take 15 mg by mouth daily. Takes with 60mg , Disp: , Rfl:    thyroid (ARMOUR)  60 MG tablet, Take 60 mg by mouth daily before breakfast. Takes with 15mg , Disp: , Rfl:    Turmeric 450 MG CAPS, Take by mouth 2 (two) times daily., Disp: , Rfl:  Medication Side Effects: none  Family Medical/ Social History: Changes? No  MENTAL HEALTH EXAM:  Last menstrual period 02/21/2015.There is no height or weight on file to calculate BMI.  General Appearance: Casual, Neat, and Well Groomed  Eye Contact:  Good  Speech:  Clear and Coherent  Volume:  Normal  Mood:  NA  Affect:  Appropriate  Thought Process:  Coherent  Orientation:  Full (Time, Place, and  Person)  Thought Content: Logical   Suicidal Thoughts:  No  Homicidal Thoughts:  No  Memory:  WNL  Judgement:  Good  Insight:  Good  Psychomotor Activity:  Normal  Concentration:  Concentration: Good  Recall:  Good  Fund of Knowledge: Good  Language: Good  Assets:  Desire for Improvement  ADL's:  Intact  Cognition: WNL  Prognosis:  Good    DIAGNOSES:    ICD-10-CM   1. Generalized anxiety disorder  F41.1 lamoTRIgine (LAMICTAL) 100 MG tablet    2. Attention deficit hyperactivity disorder (ADHD), predominantly inattentive type  F90.0 amphetamine-dextroamphetamine (ADDERALL) 20 MG tablet    3. Major depressive disorder, recurrent episode, moderate (HCC)  F33.1 lamoTRIgine (LAMICTAL) 100 MG tablet      Receiving Psychotherapy: yes   RECOMMENDATIONS:   Greater than 50% of 30  min face to face time with patient was spent on counseling and coordination of care. Discussed her moderate improvement with Adderall but no where she would like to be. Reviewed medication and discussed possible side effects.     We agreed today to:  To increase Adderall to 20  mg daily after breakfast Will continue Lamictal 100 mg daily Will report side effects or worsening symptoms promptly It was recommended that patient monitor weights and blood pressure consistently Will follow-up to this office in 8 weeks to reassess Provided emergency contact information Discussed potential benefits, risks, and side effects of stimulants with patient to include increased heart rate, palpitations, insomnia, increased anxiety, increased irritability, or decreased appetite.  Instructed patient to contact office if experiencing any significant tolerability issues.    Reviewed PDMP    Joan Flores, NP

## 2023-10-05 ENCOUNTER — Ambulatory Visit: Payer: BC Managed Care – PPO | Admitting: Behavioral Health

## 2023-10-13 ENCOUNTER — Telehealth: Payer: Self-pay | Admitting: Behavioral Health

## 2023-10-13 ENCOUNTER — Ambulatory Visit: Payer: BC Managed Care – PPO | Admitting: Psychology

## 2023-10-13 DIAGNOSIS — F900 Attention-deficit hyperactivity disorder, predominantly inattentive type: Secondary | ICD-10-CM

## 2023-10-13 DIAGNOSIS — F4323 Adjustment disorder with mixed anxiety and depressed mood: Secondary | ICD-10-CM

## 2023-10-13 NOTE — Progress Notes (Addendum)
 [name] Counselor Initial Adult Exam  Name: Megan Cain Date: 10/13/2023 MRN: [id number] DOB: 01/24/1961 PCP: Megan Ivan, MD    Guardian/Payee:  N/A    Paperwork requested: No   Reason for Visit /Presenting Problem: mild depression and anxiety, grief, poor self-esteem  Mental Status Exam: Appearance:   Casual     Behavior:  Appropriate  Motor:  Normal  Speech/Language:   Clear and Coherent  Affect:  Appropriate  Mood:  normal  Thought process:  normal  Thought content:    WNL  Sensory/Perceptual disturbances:    WNL  Orientation:  oriented to person, place, and situation  Attention:  Good  Concentration:  Good  Memory:  WNL  Fund of knowledge:   Good  Insight:    Good  Judgment:   Good  Impulse Control:  Good     Reported Symptoms:  guilt, grief (sadness), depleted sense of self  Risk Assessment: Danger to Self:  No Self-injurious Behavior: No Danger to Others: No Duty to Warn:no Physical Aggression / Violence:No  Access to Firearms a concern: No  Gang Involvement:No  Patient / guardian was educated about steps to take if suicide or homicide risk level increases between visits: n/a While future psychiatric events cannot be accurately predicted, the patient does not currently require acute inpatient psychiatric care and does not currently meet [facility]  involuntary commitment criteria.  Substance Abuse  History: Current substance abuse: No     Past Psychiatric History:   No previous psychological problems have been observed Outpatient Providers:N/A History of Psych Hospitalization: No  Psychological Testing:  N/A    Abuse History:  Victim of: No.,  N/A    Report needed: No. Victim of Neglect:No. Perpetrator of  N/A   Witness / Exposure to Domestic Violence: No   Protective Services Involvement: No  Witness to [employer] Violence:  No   Family History:  Family History  Problem Relation Age of  Onset   Dementia Mother    Hypertension Mother    Breast cancer Mother    Dementia Father    Heart disease Father    Hyperlipidemia Father    Stroke Father    Depression Sister    Anxiety disorder Sister    Leukemia Brother    Breast cancer Maternal Aunt    Suicidality Paternal Uncle    Diabetes Son        type 1 in both sons   Diabetes Son    Colon cancer Neg Hx    Stomach cancer Neg Hx    Esophageal cancer Neg Hx    Rectal cancer Neg Hx     Living situation: the patient lives with their spouse  Sexual Orientation: Straight  Relationship Status: married  Name of spouse / other:Megan Cain If a parent, number of children / ages:Two boys, ages 27 and 34  Support Systems: spouse  Surveyor, quantity Stress:  No   Income/Employment/Disability: Employment  Financial planner: No   Educational History: Education:  unknown  Oncologist: unknown  Any cultural differences that may affect / interfere with treatment:  not applicable   Recreation/Hobbies: reading  Stressors: Marital or family conflict    Strengths: Supportive Relationships  Barriers:  undetermined   Legal History: Pending legal issue / charges: The patient has no significant history of legal issues. 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 shoulder   SQUAMOUS CELL CARCINOMA EXCISION  09/2015   Right ear   thumb surgery Left 1975    Medications: Current Outpatient Medications  Medication Sig Dispense Refill   amphetamine -dextroamphetamine  (ADDERALL) 10 MG tablet Take 1 tablet (10 mg total) by mouth daily with breakfast. 30 tablet 0   amphetamine -dextroamphetamine  (ADDERALL) 20 MG tablet Take 1 tablet (20 mg total) by mouth daily. 30 tablet 0   B Complex Vitamins (B COMPLEX PO) Take 1 tablet by mouth daily.     budesonide-formoterol (SYMBICORT) 160-4.5 MCG/ACT inhaler Inhale 2 puffs into the lungs 2 (two) times daily. (Patient not taking: Reported on 10/29/2022)     Calcium Carbonate (CALTRATE 600 PO) Take 600 mg by mouth 2 (two) times daily.     cholecalciferol (VITAMIN D ) 1000 UNITS tablet Take 1,000 Units by mouth daily.     Coenzyme Q10 (CO Q 10 PO) Take 1 tablet by mouth daily.     Continuous Blood Gluc Sensor (DEXCOM G4 SENSOR) MISC by Does not apply route. G7     Cyanocobalamin (VITAMIN B-12 PO) Take 1 tablet by mouth daily.     fish oil-omega-3 fatty acids 1000 MG capsule Take 2 g by  mouth daily.     fluticasone (FLONASE) 50 MCG/ACT nasal spray Place into both nostrils daily.     JARDIANCE 25 MG TABS tablet Take 25 mg by mouth daily.     lamoTRIgine  (LAMICTAL ) 100 MG tablet Take 1 tablet (100 mg total) by mouth 2 (two) times daily. 90 tablet 1   LANTUS SOLOSTAR 100 UNIT/ML Solostar Pen Inject 6 Units into the skin  2 (two) times daily.     loratadine (CLARITIN) 10 MG tablet Take 10 mg by mouth daily.     MAGNESIUM PO Take 1 tablet by mouth daily.     montelukast (SINGULAIR) 10 MG tablet Take 10 mg by mouth at bedtime.     Multiple Vitamins-Minerals (MULTIVITAMIN WITH MINERALS) tablet Take 1 tablet by mouth daily.     NOVOLOG 100 UNIT/ML injection 100u/day [place] via pump for 90 days     Probiotic Product (PROBIOTIC DAILY PO) Take 1 tablet by mouth daily.     rosuvastatin (CRESTOR) 5 MG tablet Take 5 mg by mouth daily.     thyroid  (ARMOUR) 15 MG tablet Take 15 mg by mouth daily. Takes with 60mg      thyroid  (ARMOUR) 60 MG tablet Take 60 mg by mouth daily before breakfast. Takes with 15mg      Turmeric 450 MG CAPS Take by mouth 2 (two) times daily.     No current facility-administered medications for this visit.    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 She has issues of grief separation and abandonment. Recent diagnosis of type 1 diabetic and insulin  dependent. Her sons are diabetic as well. Most recently consumed with the news that her father's Cain is terminally ill. Father died a few years ago, and Megan Cain. She is angry, bitter and hurt toward this Cain, who also treated Megan Cain. Cain her father's closest friends also had issues with his Cain. Megan Cain for almost 30 years and they are very different. They did not really connect well from the start. This Cain was very controlling. Megan Cain "I don't like people to dislike me and her step mother did not like her. Megan Cain and fears that family members have a bad perception of her because of her father's Cain. In particular, her deceased brother's family had a good relationship with her step-mother because they lived so far away ([place] ).  Her  parents divorced in 2 when Megan Cain was 3. They had separated twice before. It was a bitter divorce and mother did not want the divorce. Megan Cain has 1 deceased brother who was 3 years older and a sister 4 years younger (in Megan Cain, Megan Cain). Brother died of complications from bone marrow transplant. Her mother in law was diagnosed with cancer around same time and passed away. Her brother was best friends with her husband and they worked together. Her husband, father and brother worked in their own business together. Her husband  Geraldean Klein) now owns the business with Megan Cain. 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 Jamyrah and Geraldean Klein married 33 years and together 8 before that. They have 2 boys. (Dylan age 58 and [place] age 33). Both live in [place]. Manfred Seed lives with girlfriend. Both boys have diabetes. Her marital relationship is "up and down". Relationship with mother: "She was not a nurturing person". Toral became very  independent when her parents divorced. She wonders if mother is on spectrum. She Cain mom was quirky, especially socially. Mom was not affectionate and they did not share interests. Mom had lots of friends, but not with a lot of depth.  Her "grief" hits her "really hard". Lots of loss in her life. Uncle committed suicide, brother's death and mother in law's death. Darnella gets very sad with separation and thinks it was because of parents separations and divorce. When her mother went away to teach, it was devastating for Fanisha and felt that maybe she was to blame.  Her weekly routine: Cain she has backed off because they now "butt heads". So goes to see her at [facility] once a week and drives her to appointments. She now goes out to visit to help her do laundry and other tasks.  She states that depression runs in her family. She tries to focus on self-care (exercise, socialize). She is invlved in community work. No big hobbies, but likes to read. Rarely drinks (maybe weekly) and occasional weed.  Mother was diagnosed with depression, father and his brother both had depression. Patsy's sister is also prone to depression. She takes Lamotrigine  for several years. Level of depression is about a 3 of 10. Has some transient anxiety. Sleep and appetite is fine. Does mindfulness and meditation practices.           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 She is also experiencing unresolved grief related to numerous losses and is seeking to work through those feelings. She also reports very low self-esteem. Will explore origins of this esteem issue and develop strategies to resolve/manage. Goal date 12-25. Patient agreed to be seen in provider's office.   Session Note: Naomee Cain that her hand issue remains. 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 At last session, she had talked about some frustrations with her mom and had decided not to bring her along for manicure/pedicure. This is because mother is mostly unaware of what she is missing or not. She is reducing involvement, worry, anger and expectations with regard to her mom's hygiene. She is working to determine how to assist mom with hygiene without getting overly invested and upset  with her level of cooperation. She did increase her ADHD medication dose and thinks it is interfering with her sleep. She called her doctor and told him that she will need to likely make an adjustment. May end up with a different medicine.                             Jola Nash, PhD 2:15p-3:00p 50 minutes

## 2023-10-13 NOTE — Telephone Encounter (Signed)
 Pt is currently on 20 mg Adderall. She reports she is having difficulty sleeping that she feels like is related to Adderall, though has reported sleep issues prior to starting it. She takes Adderall first thing in the morning. She does have caffeine after lunch some days. Also reports reading from an iPad before bed. She feels like the current Adderall dose is addressing her focus. She is also on Lamictal 100 mg.

## 2023-10-13 NOTE — Telephone Encounter (Signed)
 Pt lvm that the adderall dose is working. However she is having trouble sleeping. She thinks that her mediciine needs to be changed. Please call her at [phone]

## 2023-10-13 NOTE — Telephone Encounter (Signed)
 First step is cutting out the caffeine after lunch time. Half life is 9-12 hours. The Adderall could be complicating sleep. She may get use to the medication more the longer she takes. Its up to her but the only other solution is reducing the dose of Adderall.

## 2023-10-15 NOTE — Telephone Encounter (Signed)
 LVM that I would send a MyChart message with Brian's response and I did.

## 2023-10-20 ENCOUNTER — Other Ambulatory Visit

## 2023-10-20 DIAGNOSIS — Z006 Encounter for examination for normal comparison and control in clinical research program: Secondary | ICD-10-CM

## 2023-10-26 LAB — GENECONNECT MOLECULAR SCREEN: Genetic Analysis Overall Interpretation: NEGATIVE

## 2023-11-01 ENCOUNTER — Ambulatory Visit: Payer: BC Managed Care – PPO | Admitting: Obstetrics and Gynecology

## 2023-11-03 ENCOUNTER — Ambulatory Visit: Payer: BC Managed Care – PPO | Admitting: Psychology

## 2023-11-03 DIAGNOSIS — F4323 Adjustment disorder with mixed anxiety and depressed mood: Secondary | ICD-10-CM | POA: Diagnosis not present

## 2023-11-03 DIAGNOSIS — F900 Attention-deficit hyperactivity disorder, predominantly inattentive type: Secondary | ICD-10-CM

## 2023-11-03 NOTE — Progress Notes (Signed)
 [facility] Counselor Initial Adult Exam  Name: Megan Cain Date: 11/03/2023 MRN: [id number] DOB: [birth year] PCP: Allene Ivan, MD    Guardian/Payee:  N/A    Paperwork requested: No   Reason for Visit /Presenting Problem: mild depression and anxiety, grief, poor self-esteem  Mental Status Exam: Appearance:   Casual     Behavior:  Appropriate  Motor:  Normal  Speech/Language:   Clear and Coherent  Affect:  Appropriate  Mood:  normal  Thought process:  normal  Thought content:    WNL  Sensory/Perceptual disturbances:    WNL  Orientation:  oriented to person, place, and situation  Attention:  Good  Concentration:  Good  Memory:  WNL  Fund of knowledge:   Good  Insight:    Good  Judgment:   Good  Impulse Control:  Good     Reported Symptoms:  guilt, grief (sadness), depleted sense of self  Risk Assessment: Danger to Self:  No Self-injurious Behavior: No Danger to Others: No Duty to Warn:no Physical Aggression / Violence:No  Access to Firearms a concern: No  Gang Involvement:No  Patient / guardian was educated about steps to take if suicide or homicide risk level increases between visits: n/a While future psychiatric events cannot be accurately predicted, the patient does not currently require acute inpatient psychiatric care and does not currently meet [place]  involuntary commitment  criteria.  Substance Abuse History: Current substance abuse: No     Past Psychiatric History:   No previous psychological problems have been observed Outpatient Providers:N/A History of Psych Hospitalization: No  Psychological Testing:  N/A    Abuse History:  Victim of: No.,  N/A    Report needed: No. Victim of Neglect:No. Perpetrator of  N/A   Witness / Exposure to Domestic Violence: No   Protective Services Involvement: No  Witness to [employer]  Violence:  No   Family History:  Family History  Problem Relation Age of Onset   Dementia Mother    Hypertension Mother    Breast cancer Mother    Dementia Father    Heart disease Father    Hyperlipidemia Father    Stroke Father    Depression Sister    Anxiety disorder Sister    Leukemia Brother    Breast cancer Maternal Aunt    Suicidality Paternal Uncle    Diabetes Son        type 1 in both sons   Diabetes Son    Colon cancer Neg Hx    Stomach cancer Neg Hx    Esophageal cancer Neg Hx    Rectal cancer Neg Hx     Living situation: the patient lives with their spouse  Sexual Orientation: Straight  Relationship Status: married  Name of spouse / other:[name] If a parent, number of children / ages:Two boys, ages 39 and 60  Support Systems: spouse  Surveyor, quantity Stress:  No   Income/Employment/Disability: Employment  Financial planner: No   Educational History: Education:  unknown  Oncologist: unknown  Any cultural differences that may affect / interfere with treatment:  not applicable   Recreation/Hobbies: reading  Stressors: Marital or family conflict    Strengths: Supportive Relationships  Barriers:  undetermined   Legal History: Pending legal issue / charges: The patient has no significant history of legal issues. 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 shoulder   SQUAMOUS CELL CARCINOMA EXCISION  09/2015   Right ear   thumb surgery Left 1975    Medications: Current Outpatient Medications  Medication Sig Dispense Refill   amphetamine -dextroamphetamine  (ADDERALL) 10 MG tablet Take 1 tablet (10 mg total) by mouth daily with breakfast. 30 tablet 0   amphetamine -dextroamphetamine  (ADDERALL) 20 MG tablet Take 1 tablet (20 mg total) by mouth daily. 30 tablet 0   B Complex Vitamins (B COMPLEX PO) Take 1 tablet by mouth daily.     budesonide-formoterol (SYMBICORT) 160-4.5 MCG/ACT inhaler Inhale 2 puffs into the lungs 2 (two) times daily. (Patient not taking: Reported on 10/29/2022)     Calcium Carbonate (CALTRATE 600 PO) Take 600 mg by mouth 2 (two) times daily.     cholecalciferol (VITAMIN D ) 1000 UNITS tablet Take 1,000 Units by mouth daily.     Coenzyme Q10 (CO Q 10 PO) Take 1 tablet by mouth daily.     Continuous Blood Gluc Sensor (DEXCOM G4 SENSOR) MISC by Does not apply route. G7     Cyanocobalamin (VITAMIN B-12 PO) Take 1 tablet by mouth daily.  fish oil-omega-3 fatty acids 1000 MG capsule Take 2 g by mouth daily.     fluticasone (FLONASE) 50 MCG/ACT nasal spray Place into both nostrils daily.     JARDIANCE 25 MG TABS tablet Take 25 mg by mouth daily.     lamoTRIgine  (LAMICTAL ) 100 MG tablet Take 1 tablet (100 mg total) by mouth 2 (two) times daily. 90 tablet 1   LANTUS SOLOSTAR 100 UNIT/ML Solostar Pen  Inject 6 Units into the skin 2 (two) times daily.     loratadine (CLARITIN) 10 MG tablet Take 10 mg by mouth daily.     MAGNESIUM PO Take 1 tablet by mouth daily.     montelukast (SINGULAIR) 10 MG tablet Take 10 mg by mouth at bedtime.     Multiple Vitamins-Minerals (MULTIVITAMIN WITH MINERALS) tablet Take 1 tablet by mouth daily.     NOVOLOG 100 UNIT/ML injection 100u/day Megan Cain via pump for 90 days     Probiotic Product (PROBIOTIC DAILY PO) Take 1 tablet by mouth daily.     rosuvastatin (CRESTOR) 5 MG tablet Take 5 mg by mouth daily.     thyroid  (ARMOUR) 15 MG tablet Take 15 mg by mouth daily. Takes with 60mg      thyroid  (ARMOUR) 60 MG tablet Take 60 mg by mouth daily before breakfast. Takes with 15mg      Turmeric 450 MG CAPS Take by mouth 2 (two) times daily.     No current facility-administered medications for this visit.    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 She has issues of grief separation and abandonment. Recent diagnosis of type 1 diabetic and insulin  dependent. Her sons are diabetic as well. Most recently consumed with the news that her father's wife is terminally ill. Father died a few years ago, and Megan Cain had a lot of anger and resentment toward father's wife. She is angry, bitter and hurt toward this woman, who also treated Megan Cain poorly. Says her father's closest friends also had issues with his wife. Megan Cain knew this woman for almost 30 years and they are very different. They did not really connect well from the start. This woman was very controlling. Megan Cain says "I don't like people to dislike me and her step mother did not like her. Megan Cain feels guilty and fears that family members have a bad perception of her because of her father's wife. In particular, her deceased brother's family had a good relationship with her step-mother because they lived so  far away ([place] ).  Her parents divorced in 26 when Megan Cain was 89. They had separated twice before. It was a bitter divorce and mother did not want the divorce. Megan Cain has 1 deceased brother who was 3 years older and a sister 4 years younger (in [place], [place]). Brother died of complications from bone marrow transplant. Her mother in law was diagnosed with cancer around same time and passed away. Her brother was best friends with her husband and they worked together. Her husband, father and brother worked in their own business together. 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 Eryn and Megan Cain married 33 years and together 8 before that. They have 2 boys. (Megan Cain age 27 and Megan Cain age 2). Both live in [place]. Megan Cain lives with girlfriend. Both boys have diabetes. Her marital relationship is "up and down". Relationship  with mother: "She was not a nurturing person". Nohealani became very independent when her parents divorced. She wonders if mother is on spectrum. She says mom was quirky, especially socially. Mom was not affectionate and they did not share interests. Mom had lots of friends, but not with a lot of depth.  Her "grief" hits her "really hard". Lots of loss in her life. Uncle committed suicide, brother's death and mother in law's death. Isidora gets very sad with separation and thinks it was because of parents separations and divorce. When her mother went away to teach, it was devastating for Briele and felt that maybe she was to blame.  Her weekly routine: Says she has backed off because they now "butt heads". So goes [facility] see [facility] at [facility] once a week and drives her to appointments. She now goes out to visit to help her do laundry and other tasks.  She states that depression runs in her family. She tries to focus on self-care (exercise, socialize). She is invlved in community work. No big hobbies, but likes to read. Rarely drinks (maybe  weekly) and occasional weed. Mother was diagnosed with depression, father and his brother both had depression. Alvena's sister is also prone to depression. She takes Lamotrigine  for several years. Level of depression is about a 3 of 10. Has some transient anxiety. Sleep and appetite is fine. Does mindfulness and meditation practices.           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 She is also experiencing unresolved grief related to numerous losses and is seeking to work through those feelings. She also reports very low self-esteem. Will explore origins of this esteem issue and develop strategies to resolve/manage. Goal date 3-25. Patient agreed to be seen in provider's office.   Session Note: Julea was exercising with a friend and trainer. She was running and says her legs felt weak. She fell and hit her eye. She has had similar situations in the past and fears she will start to become more apprehensive with physical exertion. Not sure if her ADHD meds were a factor. She feels that the meds have been helpful. Mother's birthday is this weekend and Shiloh plans to take her out. Her triggers for depression are when she procrastinates and feels socially isolated.                                     Jola Nash, PhD 10:40a-11:30a 50 minutes

## 2023-11-05 ENCOUNTER — Telehealth: Payer: Self-pay | Admitting: Behavioral Health

## 2023-11-05 NOTE — Telephone Encounter (Signed)
 LVM to [facility]

## 2023-11-05 NOTE — Telephone Encounter (Signed)
 Next visit is 11/29/23. Ronniesha states that she is on Adderall 20 mg and its causing high blood pressure and restless sleep. Please call her at [phone].  Pharmacy is:  [facility] [address] [facility] [employer] [place], [place] - [address]   Phone: [phone]  Fax: [phone]

## 2023-11-05 NOTE — Telephone Encounter (Signed)
 Pt had called back and I was not available. I returned the call and got VM. I left VM per DPR that stimulants do have the potential to cause increased BP and that she needs to take as early in the day as possible to avoid sleep disturbance.

## 2023-11-09 ENCOUNTER — Other Ambulatory Visit: Payer: Self-pay | Admitting: Obstetrics and Gynecology

## 2023-11-09 DIAGNOSIS — Z1231 Encounter for screening mammogram for malignant neoplasm of breast: Secondary | ICD-10-CM

## 2023-11-11 ENCOUNTER — Ambulatory Visit: Admitting: Obstetrics and Gynecology

## 2023-11-15 ENCOUNTER — Ambulatory Visit: Admitting: Behavioral Health

## 2023-11-24 ENCOUNTER — Ambulatory Visit: Admitting: Behavioral Health

## 2023-11-24 ENCOUNTER — Encounter: Payer: Self-pay | Admitting: Behavioral Health

## 2023-11-24 ENCOUNTER — Ambulatory Visit: Payer: BC Managed Care – PPO | Admitting: Psychology

## 2023-11-24 DIAGNOSIS — F331 Major depressive disorder, recurrent, moderate: Secondary | ICD-10-CM

## 2023-11-24 DIAGNOSIS — F900 Attention-deficit hyperactivity disorder, predominantly inattentive type: Secondary | ICD-10-CM | POA: Diagnosis not present

## 2023-11-24 DIAGNOSIS — F4323 Adjustment disorder with mixed anxiety and depressed mood: Secondary | ICD-10-CM | POA: Diagnosis not present

## 2023-11-24 DIAGNOSIS — F411 Generalized anxiety disorder: Secondary | ICD-10-CM | POA: Diagnosis not present

## 2023-11-24 MED ORDER — AMPHETAMINE-DEXTROAMPHETAMINE 30 MG PO TABS: 30.0000 mg | ORAL_TABLET | Freq: Every day | ORAL | 0 refills | Status: DC

## 2023-11-24 NOTE — Progress Notes (Signed)
 [facility]  Patient ID: Megan Cain,  MRN: [id number]  PCP: Allene Ivan, MD  Date of Evaluation: 11/24/2023 Time spent:30 minutes  Chief Complaint:  Chief Complaint   Depression; Anxiety; ADHD; Follow-up; Medication Refill; Patient Education; Medication Problem     HISTORY/CURRENT STATUS: HPI "Megan Cain", 63 year old female presents to this office for follow up and medication management.  Collateral information should be considered reliable. Having some problems with BP. PCP aware. She says that she did not check BP as instructed when prescribed Adderall initial visit. Says that the medication was working well except for some crash around 3 pm. She also had very dry mouth and tongue with was nuisance but she used lozenges. She wanted to discuss medication options today and says that she will keep log of BP over the next few weeks.   Rates depression at 4/10 and anxiety at 3/10. Sleep has been ok but inconsistent.  Denies any history of mania, no psychosis, no auditory or visual hallucinations or delirium.  Denies SI or HI.   Past psychiatric medication trials: Zoloft Lamictal  Individual Medical History/ Review of Systems: Changes? :No   Allergies: Lisinopril  Current Medications:  Current Outpatient Medications:    amphetamine -dextroamphetamine  (ADDERALL) 30 MG tablet, Take 1 tablet by mouth daily., Disp: 30 tablet, Rfl: 0   amphetamine -dextroamphetamine  (ADDERALL) 10 MG tablet, Take 1 tablet (10 mg total) by mouth daily with breakfast., Disp: 30 tablet, Rfl: 0   amphetamine -dextroamphetamine  (ADDERALL) 20 MG tablet, Take 1 tablet (20 mg total) by mouth daily., Disp: 30 tablet, Rfl: 0   B Complex Vitamins (B COMPLEX PO), Take 1 tablet by mouth daily., Disp: , Rfl:    budesonide-formoterol (SYMBICORT) 160-4.5 MCG/ACT inhaler, Inhale 2 puffs into the lungs 2 (two) times daily. (Patient not taking: Reported on 10/29/2022), Disp: , Rfl:    Calcium Carbonate  (CALTRATE 600 PO), Take 600 mg by mouth 2 (two) times daily., Disp: , Rfl:    cholecalciferol (VITAMIN D ) 1000 UNITS tablet, Take 1,000 Units by mouth daily., Disp: , Rfl:    Coenzyme Q10 (CO Q 10 PO), Take 1 tablet by mouth daily., Disp: , Rfl:    Continuous Blood Gluc Sensor (DEXCOM G4 SENSOR) MISC, by Does not apply route. G7, Disp: , Rfl:    Cyanocobalamin (VITAMIN B-12 PO), Take 1 tablet by mouth daily., Disp: , Rfl:    fish oil-omega-3 fatty acids 1000 MG capsule, Take 2 g by mouth daily., Disp: , Rfl:    fluticasone (FLONASE) 50 MCG/ACT nasal spray, Place into both nostrils daily., Disp: , Rfl:    JARDIANCE 25 MG TABS tablet, Take 25 mg by mouth daily., Disp: , Rfl:    lamoTRIgine  (LAMICTAL ) 100 MG tablet, Take 1 tablet (100 mg total) by mouth 2 (two) times daily., Disp: 90 tablet, Rfl: 1   LANTUS SOLOSTAR 100 UNIT/ML Solostar Pen, Inject 6 Units into the skin 2 (two) times daily., Disp: , Rfl:    loratadine (CLARITIN) 10 MG tablet, Take 10 mg by mouth daily., Disp: , Rfl:    MAGNESIUM PO, Take 1 tablet by mouth daily., Disp: , Rfl:    montelukast (SINGULAIR) 10 MG tablet, Take 10 mg by mouth at bedtime., Disp: , Rfl:    Multiple Vitamins-Minerals (MULTIVITAMIN WITH MINERALS) tablet, Take 1 tablet by mouth daily., Disp: , Rfl:    NOVOLOG 100 UNIT/ML injection, 100u/day Belt via pump for 90 days, Disp: , Rfl:    Probiotic Product (PROBIOTIC DAILY PO), Take 1 tablet  by mouth daily., Disp: , Rfl:    rosuvastatin (CRESTOR) 5 MG tablet, Take 5 mg by mouth daily., Disp: , Rfl:    thyroid  (ARMOUR) 15 MG tablet, Take 15 mg by mouth daily. Takes with 60mg , Disp: , Rfl:    thyroid  (ARMOUR) 60 MG tablet, Take 60 mg by mouth daily before breakfast. Takes with 15mg , Disp: , Rfl:    Turmeric 450 MG CAPS, Take by mouth 2 (two) times daily., Disp: , Rfl:  Medication Side Effects: none  Family Medical/ Social History: Changes? No  MENTAL HEALTH EXAM:  Last menstrual period 02/21/2015.There is no height  or weight on file to calculate BMI.  General Appearance: Casual, Neat, and Well Groomed  Eye Contact:  Good  Speech:  Clear and Coherent  Volume:  Normal  Mood:  Anxious  Affect:  Appropriate  Thought Process:  Coherent  Orientation:  Full (Time, Place, and Person)  Thought Content: Logical   Suicidal Thoughts:  No  Homicidal Thoughts:  No  Memory:  WNL  Judgement:  Good  Insight:  Good  Psychomotor Activity:  Normal  Concentration:  Concentration: Good  Recall:  Good  Fund of Knowledge: Good  Language: Good  Assets:  Desire for Improvement  ADL's:  Intact  Cognition: WNL  Prognosis:  Good    DIAGNOSES:    ICD-10-CM   1. Attention deficit hyperactivity disorder (ADHD), predominantly inattentive type  F90.0 amphetamine -dextroamphetamine  (ADDERALL) 30 MG tablet    2. Generalized anxiety disorder  F41.1     3. Major depressive disorder, recurrent episode, moderate (HCC)  F33.1       Receiving Psychotherapy: No    RECOMMENDATIONS:  Greater than 50% of 30  min face to face time with patient was spent on counseling and coordination of care. Discussed her concerns about BP. She is following up with PCP about the problems. She does not want to stop taking Adderall due to the reported benefit. She acknowledges never keeping log to see if Adderall had significant cause of elevated readings. She agrees to check regularly and keep log to present to PCP.  We discussed stimulant medication and alternatives.    We agreed today to:   To increase Adderall to 30  mg daily after breakfast Pt will check BP twice daily for two weeks. Will continue Lamictal  100 mg daily Will report side effects or worsening symptoms promptly It was recommended that patient monitor weights and blood pressure consistently Will follow-up to this office in 4  weeks to reassess Provided emergency contact information Discussed potential benefits, risks, and side effects of stimulants with patient to include  increased heart rate, palpitations, insomnia, increased anxiety, increased irritability, or decreased appetite.  Instructed patient to contact office if experiencing any significant tolerability issues.    Reviewed PDMP     Lincoln Renshaw, NP

## 2023-11-24 NOTE — Progress Notes (Addendum)
 [facility] Counselor Initial Adult Exam  Name: Megan Cain [facility] Date: 11/24/2023 MRN: [id number] DOB: 1960/09/11 PCP: Allene Ivan, MD    Guardian/Payee:  N/A    Paperwork requested: No   Reason for Visit /Presenting Problem: mild depression and anxiety, grief, poor self-esteem  Mental Status Exam: Appearance:   Casual     Behavior:  Appropriate  Motor:  Normal  Speech/Language:   Clear and Coherent  Affect:  Appropriate  Mood:  normal  Thought process:  normal  Thought content:    WNL  Sensory/Perceptual disturbances:    WNL  Orientation:  oriented to person, place, and situation  Attention:  Good  Concentration:  Good  Memory:  WNL  Fund of knowledge:   Good  Insight:    Good  Judgment:   Good  Impulse Control:  Good     Reported Symptoms:  guilt, grief (sadness), depleted sense of self  Risk Assessment: Danger to Self:  No Self-injurious Behavior: No Danger to Others: No Duty to Warn:no Physical Aggression / Violence:No  Access to Firearms a concern: No  Gang Involvement:No  Patient / guardian was educated about steps to take if suicide or homicide risk level increases between visits: n/a While future psychiatric events cannot be accurately predicted, the patient does not currently require acute inpatient psychiatric care and does not currently meet [facility]   involuntary commitment criteria.  Substance Abuse History: Current substance abuse: No     Past Psychiatric History:   No previous psychological problems have been observed Outpatient Providers:N/A History of Psych Hospitalization: No  Psychological Testing: N/A   Abuse History:  Victim of: No., N/A   Report needed: No. Victim of Neglect:No. Perpetrator of N/A  Witness / Exposure to Domestic Violence: No  Protective Services Involvement: No  Witness to [employer] Violence:  No   Family History:  Family History  Problem Relation Age of Onset   Dementia Mother    Hypertension Mother    Breast cancer Mother    Dementia Father    Heart disease Father    Hyperlipidemia Father    Stroke Father    Depression Sister    Anxiety disorder Sister    Leukemia Brother    Breast cancer Maternal Aunt    Suicidality Paternal Uncle    Diabetes Son        type 1 in both sons   Diabetes Son    Colon cancer Neg Hx    Stomach cancer Neg Hx    Esophageal cancer Neg Hx    Rectal cancer Neg Hx     Living situation: the patient lives with their spouse  Sexual Orientation: Straight  Relationship Status: married  Name of spouse / other:[name] If a parent, number of children / ages:Two boys, ages 64 and 18  Support Systems: spouse  Surveyor, quantity Stress:  No   Income/Employment/Disability: Employment  Financial planner: No   Educational History: Education: unknown  Oncologist: unknown  Any cultural differences that may affect / interfere with treatment:  not applicable   Recreation/Hobbies: reading  Stressors: Marital or family conflict    Strengths: Supportive Relationships  Barriers:  undetermined   Legal History: Pending legal issue / charges: The patient has no significant history of legal issues. 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 shoulder   SQUAMOUS CELL CARCINOMA EXCISION  09/2015   Right ear   thumb surgery Left 1975    Medications: Current Outpatient Medications  Medication Sig Dispense Refill   amphetamine -dextroamphetamine  (ADDERALL) 10 MG tablet Take 1 tablet (10 mg total) by mouth daily with breakfast. 30 tablet 0   amphetamine -dextroamphetamine  (ADDERALL) 20 MG tablet Take 1 tablet (20 mg total) by mouth daily. 30 tablet 0   amphetamine -dextroamphetamine  (ADDERALL) 30 MG tablet Take 1 tablet by mouth daily. 30 tablet 0   B Complex Vitamins (B COMPLEX PO) Take 1 tablet by mouth daily.     budesonide-formoterol (SYMBICORT) 160-4.5 MCG/ACT inhaler Inhale 2 puffs into the lungs 2 (two) times daily. (Patient not taking: Reported on 10/29/2022)     Calcium Carbonate (CALTRATE 600 PO) Take 600 mg by mouth 2 (two) times daily.     cholecalciferol (VITAMIN D ) 1000 UNITS tablet Take 1,000 Units by mouth daily.     Coenzyme Q10 (CO Q 10 PO) Take 1 tablet by mouth daily.     Continuous Blood Gluc Sensor (DEXCOM G4  SENSOR) MISC by Does not apply route. G7     Cyanocobalamin (VITAMIN B-12 PO) Take 1 tablet by mouth daily.     fish oil-omega-3 fatty acids 1000 MG capsule Take 2 g by mouth daily.     fluticasone (FLONASE) 50 MCG/ACT nasal spray Place into both nostrils daily.     JARDIANCE 25 MG TABS tablet Take 25 mg by mouth daily.     lamoTRIgine  (LAMICTAL ) 100 MG tablet  Take 1 tablet (100 mg total) by mouth 2 (two) times daily. 90 tablet 1   LANTUS SOLOSTAR 100 UNIT/ML Solostar Pen Inject 6 Units into the skin 2 (two) times daily.     loratadine (CLARITIN) 10 MG tablet Take 10 mg by mouth daily.     MAGNESIUM PO Take 1 tablet by mouth daily.     montelukast (SINGULAIR) 10 MG tablet Take 10 mg by mouth at bedtime.     Multiple Vitamins-Minerals (MULTIVITAMIN WITH MINERALS) tablet Take 1 tablet by mouth daily.     NOVOLOG 100 UNIT/ML injection 100u/day [place] via pump for 90 days     Probiotic Product (PROBIOTIC DAILY PO) Take 1 tablet by mouth daily.     rosuvastatin (CRESTOR) 5 MG tablet Take 5 mg by mouth daily.     thyroid  (ARMOUR) 15 MG tablet Take 15 mg by mouth daily. Takes with 60mg      thyroid  (ARMOUR) 60 MG tablet Take 60 mg by mouth daily before breakfast. Takes with 15mg      Turmeric 450 MG CAPS Take by mouth 2 (two) times daily.     No current facility-administered medications for this visit.    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 She has issues of grief separation and abandonment. Recent diagnosis of type 1 diabetic and insulin  dependent. Her sons are diabetic as well. Most recently consumed with the news that her father's wife is terminally ill. Father died a few years ago, and Megan Cain had a lot of anger and resentment toward father's wife. She is angry, bitter and hurt toward this Cain, who also treated Megan Cain. Says her father's closest friends also had issues with his wife. Megan Cain for almost 30 years and they are very different. They did not really connect well from the start. This Cain was very controlling. Megan Cain says "I don't like people to dislike me and her step mother did not like her. Megan Cain feels guilty and fears that family members have a bad perception of her because of her father's  wife. In particular, her deceased brother's family had a good relationship with her step-mother because they lived so far away ([place] ).  Her parents divorced in 24 when Megan Cain. They had separated twice before. It was a bitter divorce and mother did not want the divorce. Megan Cain has 1 deceased brother who was 3 years older and a sister 4 years younger (in [place], [place]). Brother died of complications from bone marrow transplant. Her mother in law was diagnosed with cancer around same time and passed away. Her brother was best friends with her husband and they worked together. Her husband, father and brother worked in their own business together. Her husband  Geraldean Klein) now owns the business with Megan Cain. 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 Keniah and Geraldean Klein married 33 years and together 8 before that. They have 2 boys. Regulatory affairs officer  age 43 and [place] age 65). Both live in [place]. Manfred Seed lives with girlfriend. Both boys have diabetes. Her marital relationship is "up and down". Relationship with mother: "She was not a nurturing person". Kennedie became very independent when her parents divorced. She wonders if mother is on spectrum. She says mom was quirky, especially socially. Mom was not affectionate and they did not share interests. Mom had lots of friends, but not with a lot of depth.  Her "grief" hits her "really hard". Lots of loss in her life. Uncle committed suicide, brother's death and mother in law's death. Sondra gets very sad with separation and thinks it was because of parents separations and divorce. When her mother went away to teach, it was devastating for Laquesha and felt that maybe she was to blame.  Her weekly routine: Says she has backed off because they now "butt heads". So goes to see her at [facility] once a week and drives her to appointments. She now goes out to visit to help her do laundry and other tasks.  She states that depression runs in her family. She tries to focus on self-care  (exercise, socialize). She is invlved in community work. No big hobbies, but likes to read. Rarely drinks (maybe weekly) and occasional weed. Mother was diagnosed with depression, father and his brother both had depression. Pecolia's sister is also prone to depression. She takes Lamotrigine  for several years. Level of depression is about a 3 of 10. Has some transient anxiety. Sleep and appetite is fine. Does mindfulness and meditation practices.           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 She is also experiencing unresolved grief related to numerous losses and is seeking to work through those feelings. She also reports very low self-esteem. Will explore origins of this esteem issue and develop strategies to resolve/manage. Goal date 12-25. Patient agreed to be seen in provider's office.   Session Note: Basma says her sister's daughter just had a baby. She had a check in at the psychiatrist's office. Was told to increase her Aderall to 30mg  form 20mg . Last session, discussed letting go of her mother's comments and negative reactions. She says she has been trying, but it is often difficult. She hopes that she is able to be available for her kids emotionally, unlike her own mother.  Libia says that with her ADD, she has gaps in her childhood memories. Talked about enlisting a specialist with ADHD strategies to help her manage.                                   Megan Nash, PhD 2:10p-3:30p 50 minutes

## 2023-11-29 ENCOUNTER — Ambulatory Visit: Admitting: Behavioral Health

## 2023-12-07 ENCOUNTER — Ambulatory Visit
Admission: RE | Admit: 2023-12-07 | Discharge: 2023-12-07 | Disposition: A | Discharge status: Home / Self Care | Source: Ambulatory Visit | Attending: Obstetrics and Gynecology | Admitting: Obstetrics and Gynecology

## 2023-12-07 DIAGNOSIS — Z1231 Encounter for screening mammogram for malignant neoplasm of breast: Secondary | ICD-10-CM

## 2023-12-09 ENCOUNTER — Ambulatory Visit: Payer: Self-pay | Admitting: Obstetrics and Gynecology

## 2023-12-15 ENCOUNTER — Ambulatory Visit: Payer: BC Managed Care – PPO | Admitting: Psychology

## 2023-12-15 ENCOUNTER — Encounter (INDEPENDENT_AMBULATORY_CARE_PROVIDER_SITE_OTHER): Payer: Self-pay | Admitting: Otolaryngology

## 2023-12-15 DIAGNOSIS — F4323 Adjustment disorder with mixed anxiety and depressed mood: Secondary | ICD-10-CM

## 2023-12-15 DIAGNOSIS — F900 Attention-deficit hyperactivity disorder, predominantly inattentive type: Secondary | ICD-10-CM

## 2023-12-15 NOTE — Progress Notes (Signed)
 [facility] Counselor Initial Adult Exam  Name: Megan Cain [facility] Date: 12/15/2023 MRN: [id number] DOB: 06-21-1961 PCP: Allene Ivan, MD    Guardian/Payee:  N/A    Paperwork requested: No   Reason for Visit /Presenting Problem: mild depression and anxiety, grief, poor self-esteem  Mental Status Exam: Appearance:   Casual     Behavior:  Appropriate  Motor:  Normal  Speech/Language:   Clear and Coherent  Affect:  Appropriate  Mood:  normal  Thought process:  normal  Thought content:    WNL  Sensory/Perceptual disturbances:    WNL  Orientation:  oriented to person, place, and situation  Attention:  Good  Concentration:  Good  Memory:  WNL  Fund of knowledge:   Good  Insight:    Good  Judgment:   Good  Impulse Control:  Good     Reported Symptoms:  guilt, grief (sadness), depleted sense of self  Risk Assessment: Danger to Self:  No Self-injurious Behavior: No Danger to Others: No Duty to Warn:no Physical Aggression / Violence:No  Access to Firearms a concern: No  Gang Involvement:No  Patient / guardian was educated about steps to take if suicide or homicide risk level increases between visits: n/a While future psychiatric events cannot be accurately predicted, the patient does not currently require acute inpatient psychiatric care and does not currently meet [facility]   involuntary commitment criteria.  Substance Abuse History: Current substance abuse: No     Past Psychiatric History:   No previous psychological problems have been observed Outpatient Providers:N/A History of Psych Hospitalization: No  Psychological Testing: N/A   Abuse History:  Victim of: No., N/A   Report needed: No. Victim of Neglect:No. Perpetrator of N/A  Witness / Exposure to Domestic Violence: No  Protective Services Involvement: No  Witness to [employer] Violence:  No   Family History:  Family History  Problem Relation Age of Onset   Dementia Mother    Hypertension Mother    Breast cancer Mother    Dementia Father    Heart disease Father    Hyperlipidemia Father    Stroke Father    Depression Sister    Anxiety disorder Sister    Leukemia Brother    Breast cancer Maternal Aunt    Suicidality Paternal Uncle    Diabetes Son        type 1 in both sons   Diabetes Son    Colon cancer Neg Hx    Stomach cancer Neg Hx    Esophageal cancer Neg Hx    Rectal cancer Neg Hx     Living situation: the patient lives with their spouse  Sexual Orientation: Straight  Relationship Status: married  Name of spouse / other:[name] If a parent, number of children / ages:Two boys, ages 62 and 55  Support Systems: spouse  Surveyor, quantity Stress:  No   Income/Employment/Disability: Employment  Financial planner: No   Educational History: Education: unknown  Oncologist: unknown  Any cultural differences that may affect / interfere with treatment:  not applicable   Recreation/Hobbies: reading  Stressors: Marital or family conflict    Strengths: Supportive Relationships  Barriers:  undetermined   Legal History: Pending legal issue / charges: The patient has no significant history of legal issues. 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 shoulder   SQUAMOUS CELL CARCINOMA EXCISION  09/2015   Right ear   thumb surgery Left 1975    Medications: Current Outpatient Medications  Medication Sig Dispense Refill   amphetamine -dextroamphetamine  (ADDERALL) 10 MG tablet Take 1 tablet (10 mg total) by mouth daily with breakfast. 30 tablet 0   amphetamine -dextroamphetamine  (ADDERALL) 20 MG tablet Take 1 tablet (20 mg total) by mouth daily. 30 tablet 0   amphetamine -dextroamphetamine  (ADDERALL) 30 MG tablet Take 1 tablet by mouth daily. 30 tablet 0   B Complex Vitamins (B COMPLEX PO) Take 1 tablet by mouth daily.     budesonide-formoterol (SYMBICORT) 160-4.5 MCG/ACT inhaler Inhale 2 puffs into the lungs 2 (two) times daily. (Patient not taking: Reported on 10/29/2022)     Calcium Carbonate (CALTRATE 600 PO) Take 600 mg by mouth 2 (two) times daily.     cholecalciferol (VITAMIN D ) 1000 UNITS tablet Take 1,000 Units by mouth daily.     Coenzyme Q10 (CO Q 10 PO) Take 1 tablet by mouth daily.     Continuous Blood Gluc Sensor (DEXCOM G4  SENSOR) MISC by Does not apply route. G7     Cyanocobalamin (VITAMIN B-12 PO) Take 1 tablet by mouth daily.     fish oil-omega-3 fatty acids 1000 MG capsule Take 2 g by mouth daily.     fluticasone (FLONASE) 50 MCG/ACT nasal spray Place into both nostrils daily.     JARDIANCE 25 MG TABS tablet Take 25 mg by mouth daily.     lamoTRIgine  (LAMICTAL ) 100 MG tablet  Take 1 tablet (100 mg total) by mouth 2 (two) times daily. 90 tablet 1   LANTUS SOLOSTAR 100 UNIT/ML Solostar Pen Inject 6 Units into the skin 2 (two) times daily.     loratadine (CLARITIN) 10 MG tablet Take 10 mg by mouth daily.     MAGNESIUM PO Take 1 tablet by mouth daily.     montelukast (SINGULAIR) 10 MG tablet Take 10 mg by mouth at bedtime.     Multiple Vitamins-Minerals (MULTIVITAMIN WITH MINERALS) tablet Take 1 tablet by mouth daily.     NOVOLOG 100 UNIT/ML injection 100u/day [place] via pump for 90 days     Probiotic Product (PROBIOTIC DAILY PO) Take 1 tablet by mouth daily.     rosuvastatin (CRESTOR) 5 MG tablet Take 5 mg by mouth daily.     thyroid  (ARMOUR) 15 MG tablet Take 15 mg by mouth daily. Takes with 60mg      thyroid  (ARMOUR) 60 MG tablet Take 60 mg by mouth daily before breakfast. Takes with 15mg      Turmeric 450 MG CAPS Take by mouth 2 (two) times daily.     No current facility-administered medications for this visit.    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 She has issues of grief separation and abandonment. Recent diagnosis of type 1 diabetic and insulin  dependent. Her sons are diabetic as well. Most recently consumed with the news that her father's wife is terminally ill. Father died a few years ago, and Camay had a lot of anger and resentment toward father's wife. She is angry, bitter and hurt toward this woman, who also treated Cary Clarks poorly. Says her father's closest friends also had issues with his wife. Terrill knew this woman for almost 30 years and they are very different. They did not really connect well from the start. This woman was very controlling. Tristian says "I don't like people to dislike me and her step mother did not like her. Latoi feels guilty and fears that family members have a bad perception of her because of her father's  wife. In particular, her deceased brother's family had a good relationship with her step-mother because they lived so far away ([place] ).  Her parents divorced in 24 when Taylon was 48. They had separated twice before. It was a bitter divorce and mother did not want the divorce. Ceira has 1 deceased brother who was 3 years older and a sister 4 years younger (in [place], [place]). Brother died of complications from bone marrow transplant. Her mother in law was diagnosed with cancer around same time and passed away. Her brother was best friends with her husband and they worked together. Her husband, father and brother worked in their own business together. Her husband  Geraldean Klein) now owns the business with Cary Clarks. 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 Hula and Geraldean Klein married 33 years and together 8 before that. They have 2 boys. Regulatory affairs officer  age 69 and [place] age 51). Both live in [place]. Manfred Seed lives with girlfriend. Both boys have diabetes. Her marital relationship is "up and down". Relationship with mother: "She was not a nurturing person". Latreshia became very independent when her parents divorced. She wonders if mother is on spectrum. She says mom was quirky, especially socially. Mom was not affectionate and they did not share interests. Mom had lots of friends, but not with a lot of depth.  Her "grief" hits her "really hard". Lots of loss in her life. Uncle committed suicide, brother's death and mother in law's death. Tiffini gets very sad with separation and thinks it was because of parents separations and divorce. When her mother went away to teach, it was devastating for Aliese and felt that maybe she was to blame.  Her weekly routine: Says she has backed off because they now "butt heads". So goes to see her at [facility] once a week and drives her to appointments. She now goes out to visit to help her do laundry and other tasks.  She states that depression runs in her family. She tries to focus on self-care  (exercise, socialize). She is invlved in community work. No big hobbies, but likes to read. Rarely drinks (maybe weekly) and occasional weed. Mother was diagnosed with depression, father and his brother both had depression. Ameerah's sister is also prone to depression. She takes Lamotrigine  for several years. Level of depression is about a 3 of 10. Has some transient anxiety. Sleep and appetite is fine. Does mindfulness and meditation practices.           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 She is also experiencing unresolved grief related to numerous losses and is seeking to work through those feelings. She also reports very low self-esteem. Will explore origins of this esteem issue and develop strategies to resolve/manage. Goal date 12-25. Patient agreed to be seen in provider's office.   Session Note: Yoshika says she has been creating ways to manage her ADHD. She is keeping lists for most things. She writes on an index card that she keeps close by. She still feels she would like to get an ADHD coach. Not sure about an ADHD group. I requested more information from a colleague.  Mariama says she is not as productive as she would like. She says that she has had some depressed feelings and feels she lacks motivation and energy to execute her activities.                                     Jola Nash, PhD 10:45a-11:30a 45 minutes                                                                 Jola Nash, PhD

## 2023-12-20 ENCOUNTER — Encounter: Payer: Self-pay | Admitting: Behavioral Health

## 2023-12-20 ENCOUNTER — Ambulatory Visit: Admitting: Behavioral Health

## 2023-12-20 DIAGNOSIS — F900 Attention-deficit hyperactivity disorder, predominantly inattentive type: Secondary | ICD-10-CM | POA: Diagnosis not present

## 2023-12-20 MED ORDER — AMPHETAMINE-DEXTROAMPHETAMINE 30 MG PO TABS
30.0000 mg | ORAL_TABLET | Freq: Every day | ORAL | 0 refills | Status: DC
Start: 2023-12-24 — End: 2024-02-01

## 2023-12-20 NOTE — Progress Notes (Deleted)
 [facility]  Patient ID: Megan Cain,  MRN: [id number]  PCP: Allene Ivan, MD  Date of Evaluation: 12/20/2023 Time spent:{TIME; 0 MIN TO 60 MIN:[phone]}  Chief Complaint:   HISTORY/CURRENT STATUS: HPI  Individual Medical History/ Review of Systems: Changes? :{EXAM; YES/NO:21197}  Allergies: Lisinopril  Current Medications:  Current Outpatient Medications:    amphetamine -dextroamphetamine  (ADDERALL) 10 MG tablet, Take 1 tablet (10 mg total) by mouth daily with breakfast., Disp: 30 tablet, Rfl: 0   amphetamine -dextroamphetamine  (ADDERALL) 20 MG tablet, Take 1 tablet (20 mg total) by mouth daily., Disp: 30 tablet, Rfl: 0   amphetamine -dextroamphetamine  (ADDERALL) 30 MG tablet, Take 1 tablet by mouth daily., Disp: 30 tablet, Rfl: 0   B Complex Vitamins (B COMPLEX PO), Take 1 tablet by mouth daily., Disp: , Rfl:    budesonide-formoterol (SYMBICORT) 160-4.5 MCG/ACT inhaler, Inhale 2 puffs into the lungs 2 (two) times daily. (Patient not taking: Reported on 10/29/2022), Disp: , Rfl:    Calcium Carbonate (CALTRATE 600 PO), Take 600 mg by mouth 2 (two) times daily., Disp: , Rfl:    cholecalciferol (VITAMIN D ) 1000 UNITS tablet, Take 1,000 Units by mouth daily., Disp: , Rfl:    Coenzyme Q10 (CO Q 10 PO), Take 1 tablet by mouth daily., Disp: , Rfl:    Continuous Blood Gluc Sensor (DEXCOM G4 SENSOR) MISC, by Does not apply route. G7, Disp: , Rfl:    Cyanocobalamin (VITAMIN B-12 PO), Take 1 tablet by mouth daily., Disp: , Rfl:    fish oil-omega-3 fatty acids 1000 MG capsule, Take 2 g by mouth daily., Disp: , Rfl:    fluticasone (FLONASE) 50 MCG/ACT nasal spray, Place into both nostrils daily., Disp: , Rfl:    JARDIANCE 25 MG TABS tablet, Take 25 mg by mouth daily., Disp: , Rfl:    lamoTRIgine  (LAMICTAL ) 100 MG tablet, Take 1 tablet (100 mg total) by mouth 2 (two) times daily., Disp: 90 tablet, Rfl: 1   LANTUS SOLOSTAR 100 UNIT/ML Solostar Pen, Inject 6 Units into the  skin 2 (two) times daily., Disp: , Rfl:    loratadine (CLARITIN) 10 MG tablet, Take 10 mg by mouth daily., Disp: , Rfl:    MAGNESIUM PO, Take 1 tablet by mouth daily., Disp: , Rfl:    montelukast (SINGULAIR) 10 MG tablet, Take 10 mg by mouth at bedtime., Disp: , Rfl:    Multiple Vitamins-Minerals (MULTIVITAMIN WITH MINERALS) tablet, Take 1 tablet by mouth daily., Disp: , Rfl:    NOVOLOG 100 UNIT/ML injection, 100u/day [place] via pump for 90 days, Disp: , Rfl:    Probiotic Product (PROBIOTIC DAILY PO), Take 1 tablet by mouth daily., Disp: , Rfl:    rosuvastatin (CRESTOR) 5 MG tablet, Take 5 mg by mouth daily., Disp: , Rfl:    thyroid  (ARMOUR) 15 MG tablet, Take 15 mg by mouth daily. Takes with 60mg , Disp: , Rfl:    thyroid  (ARMOUR) 60 MG tablet, Take 60 mg by mouth daily before breakfast. Takes with 15mg , Disp: , Rfl:    Turmeric 450 MG CAPS, Take by mouth 2 (two) times daily., Disp: , Rfl:  Medication Side Effects: {Medication Side Effects (Optional):12147}  Family Medical/ Social History: Changes? {EXAM; YES/NO:19492}  MENTAL HEALTH EXAM:  Last menstrual period 02/21/2015.There is no height or weight on file to calculate BMI.  General Appearance: {PSY:[phone]}  Eye Contact:  {PSY:22684}  Speech:  {[phone]}  Volume:  {PSY:22686}  Mood:  {PSY:22306}  Affect:  {PSY:[phone]}  Thought Process:  {PSY:22688}  Orientation:  {JYN:82956}  Thought Content: {PSYt:22690}   Suicidal Thoughts:  {PSY:22692}  Homicidal Thoughts:  {PSY:22692}  Memory:  {[phone]}  Judgement:  {PSY:22694}  Insight:  {PSY:22695}  Psychomotor Activity:  {PSY:22696}  Concentration:  {OZH:08657}  Recall:  {QIO:96295}  Fund of Knowledge: {PSY:22877}  Language: {MWU:13244}  Assets:  {PSY:22698}  ADL's:  {PSY:22290}  Cognition: {PSY:304700322}  Prognosis:  {PSY:22877}    DIAGNOSES: No diagnosis found.  Receiving Psychotherapy: {WNU:27253}   RECOMMENDATIONS: ***   Lincoln Renshaw, NP

## 2023-12-20 NOTE — Progress Notes (Signed)
 [facility]  Patient ID: RASHEMA SEAWRIGHT,  MRN: [id number]  PCP: Allene Ivan, MD  Date of Evaluation: 12/20/2023 Time spent:30 minutes  Chief Complaint:  Chief Complaint   ADHD; Depression; Anxiety; Follow-up; Medication Refill; Patient Education     HISTORY/CURRENT STATUS: HPI "Megan Cain", 63 year old female presents to this office for follow up and medication management.  Collateral information should be considered reliable. Still following up with PCP for BP. She is still having some afternoon crash around 3 pm but would like to try this dose for longer period before considering other options.  Rates depression at 3/10 and anxiety at 3/10. Sleep has been ok but inconsistent.  Denies any history of mania, no psychosis, no auditory or visual hallucinations or delirium.  Denies SI or HI.   Past psychiatric medication trials: Zoloft Lamictal   Individual Medical History/ Review of Systems: Changes? :No   Allergies: Lisinopril  Current Medications:  Current Outpatient Medications:    amphetamine -dextroamphetamine  (ADDERALL) 10 MG tablet, Take 1 tablet (10 mg total) by mouth daily with breakfast., Disp: 30 tablet, Rfl: 0   amphetamine -dextroamphetamine  (ADDERALL) 20 MG tablet, Take 1 tablet (20 mg total) by mouth daily., Disp: 30 tablet, Rfl: 0   [START ON 12/24/2023] amphetamine -dextroamphetamine  (ADDERALL) 30 MG tablet, Take 1 tablet by mouth daily., Disp: 30 tablet, Rfl: 0   B Complex Vitamins (B COMPLEX PO), Take 1 tablet by mouth daily., Disp: , Rfl:    budesonide-formoterol (SYMBICORT) 160-4.5 MCG/ACT inhaler, Inhale 2 puffs into the lungs 2 (two) times daily. (Patient not taking: Reported on 10/29/2022), Disp: , Rfl:    Calcium Carbonate (CALTRATE 600 PO), Take 600 mg by mouth 2 (two) times daily., Disp: , Rfl:    cholecalciferol (VITAMIN D ) 1000 UNITS tablet, Take 1,000 Units by mouth daily., Disp: , Rfl:    Coenzyme Q10 (CO Q 10 PO), Take 1 tablet by mouth  daily., Disp: , Rfl:    Continuous Blood Gluc Sensor (DEXCOM G4 SENSOR) MISC, by Does not apply route. G7, Disp: , Rfl:    Cyanocobalamin (VITAMIN B-12 PO), Take 1 tablet by mouth daily., Disp: , Rfl:    fish oil-omega-3 fatty acids 1000 MG capsule, Take 2 g by mouth daily., Disp: , Rfl:    fluticasone (FLONASE) 50 MCG/ACT nasal spray, Place into both nostrils daily., Disp: , Rfl:    JARDIANCE 25 MG TABS tablet, Take 25 mg by mouth daily., Disp: , Rfl:    lamoTRIgine  (LAMICTAL ) 100 MG tablet, Take 1 tablet (100 mg total) by mouth 2 (two) times daily., Disp: 90 tablet, Rfl: 1   LANTUS SOLOSTAR 100 UNIT/ML Solostar Pen, Inject 6 Units into the skin 2 (two) times daily., Disp: , Rfl:    loratadine (CLARITIN) 10 MG tablet, Take 10 mg by mouth daily., Disp: , Rfl:    MAGNESIUM PO, Take 1 tablet by mouth daily., Disp: , Rfl:    montelukast (SINGULAIR) 10 MG tablet, Take 10 mg by mouth at bedtime., Disp: , Rfl:    Multiple Vitamins-Minerals (MULTIVITAMIN WITH MINERALS) tablet, Take 1 tablet by mouth daily., Disp: , Rfl:    NOVOLOG 100 UNIT/ML injection, 100u/day [place] via pump for 90 days, Disp: , Rfl:    Probiotic Product (PROBIOTIC DAILY PO), Take 1 tablet by mouth daily., Disp: , Rfl:    rosuvastatin (CRESTOR) 5 MG tablet, Take 5 mg by mouth daily., Disp: , Rfl:    thyroid  (ARMOUR) 15 MG tablet, Take 15 mg by mouth daily. Takes with 60mg ,  Disp: , Rfl:    thyroid  (ARMOUR) 60 MG tablet, Take 60 mg by mouth daily before breakfast. Takes with 15mg , Disp: , Rfl:    Turmeric 450 MG CAPS, Take by mouth 2 (two) times daily., Disp: , Rfl:  Medication Side Effects: none  Family Medical/ Social History: Changes? No  MENTAL HEALTH EXAM:  Last menstrual period 02/21/2015.There is no height or weight on file to calculate BMI.  General Appearance: Casual and Well Groomed  Eye Contact:  NA  Speech:  Clear and Coherent  Volume:  Normal  Mood:  NA  Affect:  Appropriate  Thought Process:  Coherent   Orientation:  Full (Time, Place, and Person)  Thought Content: Logical   Suicidal Thoughts:  No  Homicidal Thoughts:  No  Memory:  WNL  Judgement:  Good  Insight:  Good  Psychomotor Activity:  NA  Concentration:  Concentration: Good  Recall:  Good  Fund of Knowledge: Good  Language: Good  Assets:  Desire for Improvement  ADL's:  Intact  Cognition: WNL  Prognosis:  Good    DIAGNOSES:    ICD-10-CM   1. Attention deficit hyperactivity disorder (ADHD), predominantly inattentive type  F90.0 amphetamine -dextroamphetamine  (ADDERALL) 30 MG tablet      Receiving Psychotherapy: No    RECOMMENDATIONS:   Greater than 50% of 30  min face to face time with patient was spent on counseling and coordination of care. Believes Adderall is cause of BP increase. She feels like she gets significant benefit out of the medication and does not want to stop. To continue working with PCP to get BP under control.      We agreed today to:   To continue Adderall to 30  mg daily after breakfast Pt will check BP  regularly. Will continue Lamictal  100 mg daily Will report side effects or worsening symptoms promptly It was recommended that patient monitor weights and blood pressure consistently Will follow-up to this office in 12  weeks to reassess Provided emergency contact information Discussed potential benefits, risks, and side effects of stimulants with patient to include increased heart rate, palpitations, insomnia, increased anxiety, increased irritability, or decreased appetite.  Instructed patient to contact office if experiencing any significant tolerability issues.     Lincoln Renshaw, NP

## 2024-01-06 ENCOUNTER — Ambulatory Visit: Payer: BC Managed Care – PPO | Admitting: Psychology

## 2024-01-06 DIAGNOSIS — F900 Attention-deficit hyperactivity disorder, predominantly inattentive type: Secondary | ICD-10-CM

## 2024-01-06 DIAGNOSIS — F4323 Adjustment disorder with mixed anxiety and depressed mood: Secondary | ICD-10-CM | POA: Diagnosis not present

## 2024-01-06 NOTE — Progress Notes (Signed)
 [facility] Counselor Initial Adult Exam  Name: Kamica Florance [facility] Date: 01/06/2024 MRN: [id number] DOB: 07-03-1961 PCP: [name], MD    Guardian/Payee:  N/A    Paperwork requested: No   Reason for Visit /Presenting Problem: mild depression and anxiety, grief, poor self-esteem  Mental Status Exam: Appearance:   Casual     Behavior:  Appropriate  Motor:  Normal  Speech/Language:   Clear and Coherent  Affect:  Appropriate  Mood:  normal  Thought process:  normal  Thought content:    WNL  Sensory/Perceptual disturbances:    WNL  Orientation:  oriented to person, place, and situation  Attention:  Good  Concentration:  Good  Memory:  WNL  Fund of knowledge:   Good  Insight:    Good  Judgment:   Good  Impulse Control:  Good     Reported Symptoms:  guilt, grief (sadness), depleted sense of self  Risk Assessment: Danger to Self:  No Self-injurious Behavior: No Danger to Others: No Duty to Warn:no Physical Aggression / Violence:No  Access to Firearms a concern: No  Gang Involvement:No  Patient / guardian was educated about steps to take if suicide or homicide risk level increases between visits: n/a While future psychiatric events cannot be accurately predicted, the patient does not currently require acute inpatient psychiatric care and does not  currently meet [facility] Guerra  involuntary commitment criteria.  Substance Abuse History: Current substance abuse: No     Past Psychiatric History:   No previous psychological problems have been observed Outpatient Providers:N/A History of Psych Hospitalization: No  Psychological Testing: N/A   Abuse History:  Victim of: No., N/A   Report needed: No.  Victim of Neglect:No. Perpetrator of N/A  Witness / Exposure to Domestic Violence: No   Protective Services Involvement: No  Witness to [employer] Violence:  No   Family History:  Family History  Problem Relation Age of Onset   Dementia Mother    Hypertension Mother    Breast cancer Mother    Dementia Father    Heart disease Father    Hyperlipidemia Father    Stroke Father    Depression Sister    Anxiety disorder Sister    Leukemia Brother    Breast cancer Maternal Aunt    Suicidality Paternal Uncle    Diabetes Son        type 1 in both sons   Diabetes Son    Colon cancer Neg Hx    Stomach cancer Neg Hx    Esophageal cancer Neg Hx    Rectal cancer Neg Hx     Living situation: the patient lives with their spouse  Sexual Orientation: Straight  Relationship Status: married  Name of spouse / other:[name] If a parent, number of children / ages:Two boys, ages 28 and 39  Support Systems: spouse  Surveyor, quantity Stress:  No   Income/Employment/Disability: Employment  Financial planner: No   Educational History: Education: unknown  Oncologist: unknown  Any cultural differences that may affect / interfere with treatment:  not applicable   Recreation/Hobbies: reading  Stressors: Marital or family conflict    Strengths: Supportive Relationships  Barriers:  undetermined   Legal History: Pending legal issue / charges: The patient has no significant history of legal issues. 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 shoulder   SQUAMOUS CELL CARCINOMA EXCISION  09/2015   Right ear   thumb surgery Left 1975    Medications: Current Outpatient Medications  Medication Sig Dispense Refill   amphetamine -dextroamphetamine  (ADDERALL) 10 MG tablet Take 1 tablet (10 mg total) by mouth daily with breakfast. 30 tablet 0   amphetamine -dextroamphetamine  (ADDERALL) 20 MG tablet Take 1 tablet (20 mg total) by mouth daily. 30 tablet 0   amphetamine -dextroamphetamine  (ADDERALL) 30 MG tablet Take 1 tablet by mouth daily. 30 tablet 0   B Complex Vitamins (B COMPLEX PO) Take 1 tablet by mouth daily.     budesonide-formoterol (SYMBICORT) 160-4.5 MCG/ACT inhaler Inhale 2 puffs into the lungs 2 (two) times daily. (Patient not taking: Reported on 10/29/2022)     Calcium Carbonate (CALTRATE 600 PO) Take 600 mg by mouth 2 (two) times daily.     cholecalciferol (VITAMIN D ) 1000 UNITS tablet Take 1,000 Units by mouth daily.     Coenzyme Q10 (CO Q 10 PO)  Take 1 tablet by mouth daily.     Continuous Blood Gluc Sensor (DEXCOM G4 SENSOR) MISC by Does not apply route. G7     Cyanocobalamin (VITAMIN B-12 PO) Take 1 tablet by mouth daily.     fish oil-omega-3 fatty acids 1000 MG capsule Take 2 g by mouth daily.     fluticasone (FLONASE) 50 MCG/ACT nasal spray Place into both nostrils daily.     JARDIANCE 25 MG TABS tablet Take 25 mg by mouth daily.      lamoTRIgine  (LAMICTAL ) 100 MG tablet Take 1 tablet (100 mg total) by mouth 2 (two) times daily. 90 tablet 1   LANTUS SOLOSTAR 100 UNIT/ML Solostar Pen Inject 6 Units into the skin 2 (two) times daily.     loratadine (CLARITIN) 10 MG tablet Take 10 mg by mouth daily.     MAGNESIUM PO Take 1 tablet by mouth daily.     montelukast (SINGULAIR) 10 MG tablet Take 10 mg by mouth at bedtime.     Multiple Vitamins-Minerals (MULTIVITAMIN WITH MINERALS) tablet Take 1 tablet by mouth daily.     NOVOLOG 100 UNIT/ML injection 100u/day [place] via pump for 90 days     Probiotic Product (PROBIOTIC DAILY PO) Take 1 tablet by mouth daily.     rosuvastatin (CRESTOR) 5 MG tablet Take 5 mg by mouth daily.     thyroid  (ARMOUR) 15 MG tablet Take 15 mg by mouth daily. Takes with 60mg      thyroid  (ARMOUR) 60 MG tablet Take 60 mg by mouth daily before breakfast. Takes with 15mg      Turmeric 450 MG CAPS Take by mouth 2 (two) times daily.     No current facility-administered medications for this visit.    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 She has issues of grief separation and abandonment. Recent diagnosis of type 1 diabetic and insulin  dependent. Her sons are diabetic as well. Most recently consumed with the news that her father's wife is terminally ill. Father died a few years ago, and Tianni had a lot of anger and resentment toward father's wife. She is angry, bitter and hurt toward this woman, who also treated [place] poorly. Says her father's closest friends also had issues with his wife. Marshayla knew this woman for almost 30 years and they are very different. They did not really connect well from the start. This woman was very controlling. Kilani says I don't like people to dislike me and her step mother did not like her. Rika feels guilty and fears that family members have a bad  perception of her because of her father's wife. In particular, her deceased brother's family had a good relationship with her step-mother because they lived so far away ([place] ).  Her parents divorced in 62 when Jaquelyn was 30. They had separated twice before. It was a bitter divorce and mother did not want the divorce. Hajer has 1 deceased brother who was 3 years older and a sister 4 years younger (in [place], [place]). Brother died of complications from bone marrow transplant. Her mother in law was diagnosed with cancer around same time and passed away. Her brother was best friends with her husband and they worked together. 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  Tykiera and Dorise married 33 years and together 8 before that. They have 2 boys. (Dylan age 69 and [place] age 42). Both live in [place]. Signa lives with girlfriend. Both boys have diabetes. Her marital relationship is up and down. Relationship with mother: She was not a nurturing person. Gordie became very independent when her parents divorced. She wonders if mother is on spectrum. She says mom was quirky, especially socially. Mom was not affectionate and they did not share interests. Mom had lots of friends, but not with a lot of depth.  Her grief hits her really hard. Lots of loss in her life. Uncle committed suicide, brother's death and mother in law's death. Mica gets very sad with separation and thinks it was because of parents separations and divorce. When her mother went away to teach, it was devastating for Chauntel and felt that maybe she was to blame.  Her weekly routine: Says she has backed off because they now butt heads. So goes to see her at [facility] once a week and drives her to appointments. She now goes out to visit to help her do laundry and other tasks.  She states that depression runs in  her family. She tries to focus on self-care (exercise, socialize). She is invlved in community work. No big hobbies, but likes to read. Rarely drinks (maybe weekly) and occasional weed. Mother was diagnosed with depression, father and his brother both had depression. Nieshia's sister is also prone to depression. She takes Lamotrigine  for several years. Level of depression is about a 3 of 10. Has some transient anxiety. Sleep and appetite is fine. Does mindfulness and meditation practices.           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 She is also experiencing unresolved grief related to numerous losses and is seeking to work through those feelings. She also reports very low self-esteem. Will explore origins of this esteem issue and develop strategies to resolve/manage. Goal date 12-25. Patient agreed to be seen in provider's office.   Session Note: Alyiah says there have been a couple of issues with her mother. Her mother has fallen a couple of times, but with no injuries. Noble says she is not overly concerned about mother's condition, as the facility is being a little more responsive and reporting in to her when there is an issue.  Marcell says her self-care is going well. She joined a book group about meditation. It is a workbook that they will work on together. Sleep has been terrible but says it is not effecting her during the day. Says she is going to be more intentional about good sleep hygiene.  She has been having anxiety related to fear. She has patterns. Says if she is driving, she has fears of accidents and worries when family is on the road. Gracious talked about the uncertainty of what to do with their business. She doesn't want husband to retire at this time and  doesn't feel that her 63 year old is ready to take over. We discussed having a talk with husband and determining a game plan moving forward.     CONI ALM KERNS, PhD 10:40a-11:30a 50 minutes

## 2024-01-19 ENCOUNTER — Ambulatory Visit (INDEPENDENT_AMBULATORY_CARE_PROVIDER_SITE_OTHER): Admitting: Otolaryngology

## 2024-01-19 ENCOUNTER — Encounter (INDEPENDENT_AMBULATORY_CARE_PROVIDER_SITE_OTHER): Payer: Self-pay | Admitting: Otolaryngology

## 2024-01-19 VITALS — BP 111/73 | HR 68

## 2024-01-19 DIAGNOSIS — J310 Chronic rhinitis: Secondary | ICD-10-CM

## 2024-01-19 DIAGNOSIS — R42 Dizziness and giddiness: Secondary | ICD-10-CM | POA: Diagnosis not present

## 2024-01-19 DIAGNOSIS — H9311 Tinnitus, right ear: Secondary | ICD-10-CM

## 2024-01-19 DIAGNOSIS — R0981 Nasal congestion: Secondary | ICD-10-CM

## 2024-01-19 DIAGNOSIS — J343 Hypertrophy of nasal turbinates: Secondary | ICD-10-CM

## 2024-01-20 DIAGNOSIS — H9311 Tinnitus, right ear: Secondary | ICD-10-CM | POA: Insufficient documentation

## 2024-01-20 DIAGNOSIS — R42 Dizziness and giddiness: Secondary | ICD-10-CM | POA: Insufficient documentation

## 2024-01-20 NOTE — Progress Notes (Signed)
 Patient ID: Megan Cain, female   DOB: 1960/12/12, 63 y.o.   MRN: [id number]  Follow-up: Chronic nasal obstruction New complaints: Imbalance, dizziness, right ear tinnitus  HPI: The patient is a 63 year old female who returns today for her follow-up evaluation.  The patient was previously seen for chronic nasal obstruction.  She underwent septoplasty and bilateral turbinate reduction surgery in June 2024.  The patient returns today reporting significant improvement in her nasal bleeding.  However, she has new complaints today of imbalance, dizziness, and right ear tinnitus.  She describes her dizziness as intermittent lightheaded and off-balance sensation.  She had an accidental fall in April, hitting her face on a rail.  She denies any spinning vertigo.  She also denies any otalgia, otorrhea, or recent change in her hearing.  She does have frequent recurrent right ear tinnitus.  Exam: General: Communicates without difficulty, well nourished, no acute distress. Head: Normocephalic, no evidence injury, no tenderness, facial buttresses intact without stepoff. Face/sinus: No tenderness to palpation and percussion. Facial movement is normal and symmetric. Eyes: PERRL, EOMI. No scleral icterus, conjunctivae clear. Neuro: CN II exam reveals vision grossly intact.  No nystagmus at any point of gaze. Ears: Auricles well formed without lesions.  Ear canals are intact without mass or lesion.  No erythema or edema is appreciated.  The TMs are intact without fluid. Nose: External evaluation reveals normal support and skin without lesions.  Dorsum is intact.  Anterior rhinoscopy reveals congested mucosa over anterior aspect of inferior turbinates and intact septum.  No purulence noted. Oral:  Oral cavity and oropharynx are intact, symmetric, without erythema or edema.  Mucosa is moist without lesions. Neck: Full range of motion without pain.  There is no significant lymphadenopathy.  No masses palpable.  Thyroid   bed within normal limits to palpation.  Parotid glands and submandibular glands equal bilaterally without mass.  Trachea is midline. Neuro:  CN 2-12 grossly intact. Vestibular: No nystagmus at any point of gaze. Dix Hallpike negative. Vestibular: There is no nystagmus with pneumatic pressure on either tympanic membrane or Valsalva. The cerebellar examination is unremarkable.    Assessment: 1.  Chronic/allergic rhinitis with mild nasal mucosal congestion. 2.  Her septum and turbinates are well-healed. 3.  Recurrent dizziness of unknown etiology. The possible differential diagnoses include transient BPPV, vestibular migraine, Meniere's disease, peripheral vestibular dysfunction, or other central/systemic causes.   4.  Her ear canals, tympanic membranes, and middle ear spaces are normal.  Her Dix-Hallpike maneuver is negative. 5.  Subjective right ear tinnitus.  Plan: 1.  The physical exam findings are reviewed with the patient. 2.  Continue Flonase nasal spray and nasal saline irrigation as needed. 3.  The strategies to cope with tinnitus, including the use of masker, hearing aids, tinnitus retraining therapy, and avoidance of caffeine and alcohol are discussed. 4.  The patient will likely benefit from undergoing physical therapy/vestibular rehabilitation to improve her balancing function.  5.  If the patient continues to be symptomatic, she may benefit from vestibular neurodiagnostic testing at a tertiary care center to evaluate for possible vestibular dysfunction.  6.  Outpatient hearing tests to evaluate for possible hearing loss.  No audiologist is available today.

## 2024-01-27 ENCOUNTER — Ambulatory Visit: Admitting: Psychology

## 2024-01-27 DIAGNOSIS — F4323 Adjustment disorder with mixed anxiety and depressed mood: Secondary | ICD-10-CM | POA: Diagnosis not present

## 2024-01-27 DIAGNOSIS — F900 Attention-deficit hyperactivity disorder, predominantly inattentive type: Secondary | ICD-10-CM

## 2024-01-27 NOTE — Progress Notes (Signed)
 [facility] Counselor Initial Adult Exam  Name: Megan Cain [facility] Date: 01/27/2024 MRN: [id number] DOB: 21-Dec-1960 PCP: [name], MD    Guardian/Payee:  N/A    Paperwork requested: No   Reason for Visit /Presenting Problem: mild depression and anxiety, grief, poor self-esteem  Mental Status Exam: Appearance:   Casual     Behavior:  Appropriate  Motor:  Normal  Speech/Language:   Clear and Coherent  Affect:  Appropriate  Mood:  normal  Thought process:  normal  Thought content:    WNL  Sensory/Perceptual disturbances:    WNL  Orientation:  oriented to person, place, and situation  Attention:  Good  Concentration:  Good  Memory:  WNL  Fund of knowledge:   Good  Insight:    Good  Judgment:   Good  Impulse Control:  Good     Reported Symptoms:  guilt, grief (sadness), depleted sense of self  Risk Assessment: Danger to Self:  No Self-injurious Behavior: No Danger to Others: No Duty to Warn:no Physical Aggression / Violence:No  Access to Firearms a concern: No  Gang Involvement:No  Patient / guardian was educated about steps to take if suicide or homicide risk level increases between visits: n/a While future psychiatric events cannot be accurately predicted, the patient does not currently require acute inpatient  psychiatric care and does not currently meet [place]  involuntary commitment criteria.  Substance Abuse History: Current substance abuse: No     Past Psychiatric History:   No previous psychological problems have been observed Outpatient Providers:N/A History of Psych Hospitalization: No  Psychological Testing:  N/A   Abuse History:  Victim of: No., N/A   Report needed: No. Victim of Neglect:No. Perpetrator of N/A  Witness / Exposure to Domestic Violence: No   Protective Services Involvement: No  Witness to [employer] Violence:  No   Family History:  Family History  Problem Relation Age of Onset   Dementia Mother    Hypertension Mother    Breast cancer Mother    Dementia Father    Heart disease Father    Hyperlipidemia Father    Stroke Father    Depression Sister    Anxiety disorder Sister    Leukemia Brother    Breast cancer Maternal Aunt    Suicidality Paternal Uncle    Diabetes Son        type 1 in both sons   Diabetes Son    Colon cancer Neg Hx    Stomach cancer Neg Hx    Esophageal cancer Neg Hx    Rectal cancer Neg Hx     Living situation: the patient lives with their spouse  Sexual Orientation: Straight  Relationship Status: married  Name of spouse / other:[name] If a parent, number of children / ages:Two boys, ages 56 and 69  Support Systems: spouse  Surveyor, quantity Stress:  No   Income/Employment/Disability: Employment  Financial planner: No   Educational History: Education: unknown  Oncologist: unknown  Any cultural differences that may affect / interfere with treatment:  not applicable   Recreation/Hobbies: reading  Stressors: Marital or family conflict    Strengths: Supportive Relationships  Barriers:  undetermined   Legal History: Pending legal issue / charges: The patient has no significant history of legal issues. 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 shoulder   SQUAMOUS CELL CARCINOMA EXCISION  09/2015   Right ear   thumb surgery Left 1975    Medications: Current Outpatient Medications  Medication Sig Dispense Refill   amphetamine -dextroamphetamine  (ADDERALL) 10 MG tablet Take 1 tablet (10 mg total) by mouth daily with breakfast. 30 tablet 0   amphetamine -dextroamphetamine  (ADDERALL) 20 MG tablet Take 1 tablet (20 mg total) by mouth daily. 30 tablet 0   amphetamine -dextroamphetamine  (ADDERALL) 30 MG tablet Take 1 tablet by mouth daily. 30 tablet 0   B Complex Vitamins (B COMPLEX PO) Take 1 tablet by mouth daily.     budesonide-formoterol (SYMBICORT) 160-4.5 MCG/ACT inhaler Inhale 2 puffs into the lungs 2 (two) times daily.     Calcium Carbonate (CALTRATE 600 PO) Take 600 mg by mouth 2 (two) times daily.     cholecalciferol (VITAMIN D ) 1000 UNITS tablet Take 1,000 Units by mouth daily.  Coenzyme Q10 (CO Q 10 PO) Take 1 tablet by mouth daily.     Continuous Blood Gluc Sensor (DEXCOM G4 SENSOR) MISC by Does not apply route. G7     Cyanocobalamin (VITAMIN B-12 PO) Take 1 tablet by mouth daily.     fish oil-omega-3 fatty acids 1000 MG capsule Take 2 g by mouth daily.     fluticasone (FLONASE) 50 MCG/ACT nasal spray Place into both nostrils daily.     JARDIANCE 25 MG TABS tablet Take 25 mg by mouth daily.     lamoTRIgine  (LAMICTAL )  100 MG tablet Take 1 tablet (100 mg total) by mouth 2 (two) times daily. 90 tablet 1   LANTUS SOLOSTAR 100 UNIT/ML Solostar Pen Inject 6 Units into the skin 2 (two) times daily.     loratadine (CLARITIN) 10 MG tablet Take 10 mg by mouth daily.     MAGNESIUM PO Take 1 tablet by mouth daily.     montelukast (SINGULAIR) 10 MG tablet Take 10 mg by mouth at bedtime.     Multiple Vitamins-Minerals (MULTIVITAMIN WITH MINERALS) tablet Take 1 tablet by mouth daily.     NOVOLOG 100 UNIT/ML injection 100u/day [place] via pump for 90 days     Probiotic Product (PROBIOTIC DAILY PO) Take 1 tablet by mouth daily.     rosuvastatin (CRESTOR) 5 MG tablet Take 5 mg by mouth daily.     thyroid  (ARMOUR) 15 MG tablet Take 15 mg by mouth daily. Takes with 60mg      thyroid  (ARMOUR) 60 MG tablet Take 60 mg by mouth daily before breakfast. Takes with 15mg      Turmeric 450 MG CAPS Take by mouth 2 (two) times daily.     No current facility-administered medications for this visit.    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 She has issues of grief separation and abandonment. Recent diagnosis of type 1 diabetic and insulin  dependent. Her sons are diabetic as well. Most recently consumed with the news that her father's wife is terminally ill. Father died a few years ago, and Janeann had a lot of anger and resentment toward father's wife. She is angry, bitter and hurt toward this woman, who also treated [place] poorly. Says her father's closest friends also had issues with his wife. Yalexa knew this woman for almost 30 years and they are very different. They did not really connect well from the start. This woman was very controlling. Tayden says I don't like people to dislike me and her step mother did not like her. Marielle feels guilty and fears that family members have a bad perception of her because of  her father's wife. In particular, her deceased brother's family had a good relationship with her step-mother because they lived so far away ([place] ).  Her parents divorced in 47 when Korianna was 23. They had separated twice before. It was a bitter divorce and mother did not want the divorce. Alysse has 1 deceased brother who was 3 years older and a sister 4 years younger (in [place], [place]). Brother died of complications from bone marrow transplant. Her mother in law was diagnosed with cancer around same time and passed away. Her brother was best friends with her husband and they worked together. Her husband, father and brother worked in their own business together. Her husband  Rodman) now owns the business  with [employer]. 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 Emmaleigh and Dorise married 33 years and together 8 before that. They have 2 boys. (Dylan age 43 and [place] age 46). Both live in [place]. Signa lives with girlfriend. Both boys have diabetes. Her marital relationship is up and down. Relationship with mother: She was not a nurturing person. Joanne became very independent when her parents divorced. She wonders if mother is on spectrum. She says mom was quirky, especially socially. Mom was not affectionate and they did not share interests. Mom had lots of friends, but not with a lot of depth.  Her grief hits her really hard. Lots of loss in her life. Uncle committed suicide, brother's death and mother in law's death. Jeweliana gets very sad with separation and thinks it was because of parents separations and divorce. When her mother went away to teach, it was devastating for Lesley and felt that maybe she was to blame.  Her weekly routine: Says she has backed off because they now butt heads. So goes to see her at [facility] once a week and drives her to appointments. She now goes out to visit to help her do laundry and other tasks.  She states that depression runs in her family. She tries to focus  on self-care (exercise, socialize). She is invlved in community work. No big hobbies, but likes to read. Rarely drinks (maybe weekly) and occasional weed. Mother was diagnosed with depression, father and his brother both had depression. Meeyah's sister is also prone to depression. She takes Lamotrigine  for several years. Level of depression is about a 3 of 10. Has some transient anxiety. Sleep and appetite is fine. Does mindfulness and meditation practices.           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 She is also experiencing unresolved grief related to numerous losses and is seeking to work through those feelings. She also reports very low self-esteem. Will explore origins of this esteem issue and develop strategies to resolve/manage. Goal date 12-25. Patient agreed to be seen in provider's office.   Session Note: Nhyira says states her mother has had a difficult week and she has talked with the facility about possible interventions. They recommended that her mother get a new (smaller) bed. Jolyssa had to arrange and it kept her from her workouts for the week. It got done and she says her mother is not using her walker in her apartment, as the facility has required. Alissandra is taking her mother to a number of medical appointments.  Tineka finds herself being overly worried about family, and she tends to become controlling. She wants to change how she interacts with her mother and wants to be less hostile towards her. We talked about strategies to stay calm and put things in perspective.        CONI ALM KERNS, PhD 9:40a-10:30a 50  minutes

## 2024-01-31 ENCOUNTER — Telehealth: Payer: Self-pay | Admitting: Behavioral Health

## 2024-01-31 DIAGNOSIS — F411 Generalized anxiety disorder: Secondary | ICD-10-CM

## 2024-01-31 DIAGNOSIS — F331 Major depressive disorder, recurrent, moderate: Secondary | ICD-10-CM

## 2024-01-31 NOTE — Telephone Encounter (Signed)
 Pt lvm that she needs a refill on her lamotrigine  100 mg and her adderall 30 mg. Pharmacy is [facility]

## 2024-01-31 NOTE — Telephone Encounter (Signed)
 12/25/2023 12/20/2023 1  Dextroamp-Amphetamin 30 Mg Tab 30.00

## 2024-02-01 ENCOUNTER — Other Ambulatory Visit: Payer: Self-pay

## 2024-02-01 DIAGNOSIS — F900 Attention-deficit hyperactivity disorder, predominantly inattentive type: Secondary | ICD-10-CM

## 2024-02-01 MED ORDER — LAMOTRIGINE 100 MG PO TABS: 100.0000 mg | ORAL_TABLET | Freq: Two times a day (BID) | ORAL | 0 refills | Status: DC

## 2024-02-01 MED ORDER — AMPHETAMINE-DEXTROAMPHETAMINE 30 MG PO TABS: 30.0000 mg | ORAL_TABLET | Freq: Every day | ORAL | 0 refills | Status: AC

## 2024-02-01 NOTE — Telephone Encounter (Signed)
 Called patient to verify that she is taking Lamictal  100 mg BID and she is. Rx sent for Lamictal  and pended Adderall.

## 2024-02-10 ENCOUNTER — Ambulatory Visit (INDEPENDENT_AMBULATORY_CARE_PROVIDER_SITE_OTHER): Admitting: Audiology

## 2024-02-17 ENCOUNTER — Ambulatory Visit: Admitting: Psychology

## 2024-02-17 DIAGNOSIS — F4323 Adjustment disorder with mixed anxiety and depressed mood: Secondary | ICD-10-CM | POA: Diagnosis not present

## 2024-02-17 DIAGNOSIS — F411 Generalized anxiety disorder: Secondary | ICD-10-CM

## 2024-02-17 NOTE — Progress Notes (Signed)
 [facility] Counselor Initial Adult Exam  Name: Megan Cain Date: 02/17/2024 MRN: [id number] DOB: 01-26-1961 PCP: [name], MD    Guardian/Payee:  N/A    Paperwork requested: No   Reason for Visit /Presenting Problem: mild depression and anxiety, grief, poor self-esteem  Mental Status Exam: Appearance:   Casual     Behavior:  Appropriate  Motor:  Normal  Speech/Language:   Clear and Coherent  Affect:  Appropriate  Mood:  normal  Thought process:  normal  Thought content:    WNL  Sensory/Perceptual disturbances:    WNL  Orientation:  oriented to person, place, and situation  Attention:  Good  Concentration:  Good  Memory:  WNL  Fund of knowledge:   Good  Insight:    Good  Judgment:   Good  Impulse Control:  Good     Reported Symptoms:  guilt, grief (sadness), depleted sense of self  Risk Assessment: Danger to Self:  No Self-injurious Behavior: No Danger to Others: No Duty to Warn:no Physical Aggression / Violence:No  Access to Firearms a concern: No  Gang Involvement:No  Patient / guardian was educated about steps to take if suicide or homicide risk level increases between visits: n/a While future psychiatric events cannot be accurately predicted, the patient does not  currently require acute inpatient psychiatric care and does not currently meet [facility]  involuntary commitment criteria.  Substance Abuse History: Current substance abuse: No     Past Psychiatric History:   No previous  psychological problems have been observed Outpatient Providers:N/A History of Psych Hospitalization: No  Psychological Testing: N/A   Abuse History:  Victim of: No., N/A   Report needed: No. Victim of Neglect:No. Perpetrator of N/A  Witness / Exposure to Domestic Violence: No   Protective Services Involvement: No  Witness to [employer] Violence:  No   Family History:  Family History  Problem Relation Age of Onset   Dementia Mother    Hypertension Mother    Breast cancer Mother    Dementia Father    Heart disease Father    Hyperlipidemia Father    Stroke Father    Depression Sister    Anxiety disorder Sister    Leukemia Brother    Breast cancer Maternal Aunt    Suicidality Paternal Uncle    Diabetes Son        type 1 in both sons   Diabetes Son    Colon cancer Neg Hx    Stomach cancer Neg Hx    Esophageal cancer Neg Hx    Rectal cancer Neg Hx     Living situation: the patient lives with their spouse  Sexual Orientation: Straight  Relationship Status: married  Name of spouse / other:[name] If a parent, number of children / ages:Two boys, ages 9 and 32  Support Systems: spouse  Surveyor, quantity Stress:  No   Income/Employment/Disability: Employment  Financial planner: No   Educational History: Education: unknown  Oncologist: unknown  Any cultural differences that may affect / interfere with treatment:  not applicable   Recreation/Hobbies: reading  Stressors: Marital or family conflict    Strengths: Supportive Relationships  Barriers:  undetermined   Legal History: Pending legal issue / charges: The patient has no significant history of legal issues. 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 shoulder   SQUAMOUS CELL CARCINOMA EXCISION  09/2015   Right ear   thumb surgery Left 1975    Medications: Current Outpatient Medications  Medication Sig Dispense Refill   amphetamine -dextroamphetamine  (ADDERALL) 30 MG tablet Take 1 tablet by mouth daily. 30 tablet 0   B Complex Vitamins (B COMPLEX PO) Take 1 tablet by mouth daily.     budesonide-formoterol (SYMBICORT) 160-4.5 MCG/ACT inhaler Inhale 2 puffs into the lungs 2 (two) times daily.     Calcium Carbonate (CALTRATE 600 PO) Take 600 mg by mouth 2 (two) times daily.     cholecalciferol (VITAMIN D ) 1000 UNITS tablet Take 1,000 Units by mouth daily.     Coenzyme Q10 (CO Q 10 PO) Take 1 tablet by mouth daily.     Continuous Blood Gluc Sensor (DEXCOM G4  SENSOR) MISC by Does not apply route. G7     Cyanocobalamin (VITAMIN B-12 PO) Take 1 tablet by mouth daily.     fish oil-omega-3 fatty acids 1000 MG capsule Take 2 g by mouth daily.     fluticasone (FLONASE) 50 MCG/ACT nasal spray Place into both nostrils daily.     JARDIANCE 25 MG TABS tablet Take 25 mg by mouth daily.     lamoTRIgine  (LAMICTAL ) 100 MG tablet Take 1 tablet (100 mg total) by mouth 2 (two) times daily. 180 tablet 0   LANTUS SOLOSTAR 100 UNIT/ML Solostar Pen Inject 6 Units into the skin 2 (two) times daily.     loratadine  (CLARITIN) 10 MG tablet Take 10 mg by mouth daily.     MAGNESIUM PO Take 1 tablet by mouth daily.     montelukast (SINGULAIR) 10 MG tablet Take 10 mg by mouth at bedtime.     Multiple Vitamins-Minerals (MULTIVITAMIN WITH MINERALS) tablet Take 1 tablet by mouth daily.     NOVOLOG 100 UNIT/ML injection 100u/day [place] via pump for 90 days     Probiotic Product (PROBIOTIC DAILY PO) Take 1 tablet by mouth daily.     rosuvastatin (CRESTOR) 5 MG tablet Take 5 mg by mouth daily.     thyroid  (ARMOUR) 15 MG tablet Take 15 mg by mouth daily. Takes with 60mg      thyroid  (ARMOUR) 60 MG tablet Take 60 mg by mouth daily before breakfast. Takes with 15mg      Turmeric 450 MG CAPS Take by mouth 2 (two) times daily.     No current facility-administered medications for this visit.    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 She has issues of grief separation and abandonment. Recent diagnosis of type 1 diabetic and insulin  dependent. Her sons are diabetic as well. Most recently consumed with the news that her father's wife is terminally ill. Father died a few years ago, and Maybell had a lot of anger and resentment toward father's wife. She is angry, bitter and hurt toward this woman, who also treated [place] poorly. Says her father's closest friends also had issues with his wife. Elandra knew this woman for almost 30 years and they are very different. They did not really connect well from the start. This woman was very controlling. Amina says I don't like people to dislike me and her step mother did not like her. Ryann feels guilty and fears that family members have a bad perception of her because of her father's wife. In particular, her deceased brother's family had a good relationship with her step-mother because they lived so far away ([place] ).  Her parents divorced in 87 when Nayellie  was 14. They had separated twice before. It was a bitter divorce and mother did not want the divorce. Millie has 1 deceased brother who was 3 years older and a sister 4 years younger (in [place], [place]). Brother died of complications from bone marrow transplant. Her mother in law was diagnosed with cancer around same time and passed away. Her brother was best friends with her husband and they worked together. 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 Carrye and Dorise married 33 years and together 8 before that. They have 2 boys. Regulatory affairs officer  age 20 and [place] age 50). Both live in [place]. Signa lives with girlfriend. Both boys have diabetes. Her marital relationship is up and down. Relationship with mother: She was not a nurturing person. Charletha became very independent when her parents divorced. She wonders if mother is on spectrum. She says mom was quirky, especially socially. Mom was not affectionate and they did not share interests. Mom had lots of friends, but not with a lot of depth.  Her grief hits her really hard. Lots of loss in her life. Uncle committed suicide, brother's death and mother in law's death. Chyane gets very sad with separation and thinks it was because of parents separations and divorce. When her mother went away to teach, it was devastating for Nailea and felt that maybe she was to blame.  Her weekly routine: Says she has backed off because they now butt heads. So goes to see her at [facility] once a week and drives her to appointments. She now goes out to visit to help her do laundry and other tasks.  She states that depression runs in her family. She tries to focus on self-care (exercise, socialize). She is invlved in community work. No big hobbies, but likes to read. Rarely drinks (maybe weekly) and occasional weed. Mother was diagnosed with depression,  father and his brother both had depression. Sorayah's sister is also prone to depression. She takes Lamotrigine  for several years. Level of depression is about a 3 of 10. Has some transient anxiety. Sleep and appetite is fine. Does mindfulness and meditation practices.           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 She is also experiencing unresolved grief related to numerous losses and is seeking to work through those feelings. She also reports very low self-esteem. Will explore origins of this esteem issue and develop strategies to resolve/manage. Goal date 12-25. Patient agreed to be seen in provider's office.   Session Note: Lovey says her father left she and her sibs a couple of industrial rental buildings. They are going to sell one of the buildings. Cornesha says that this has triggered her anticipatory anxiety. She has her first challenge is that the current tenant needed to be told that they are selling the building. This made her very nervous. She tried to use the template of wish, what is desired outcome, obstacles and plan. She gets overly worried about conflict and criticism. Her anxiety has been mostly manageable and she is taking care of herself.  She says that at home, everything has been okay.   CONI ALM KERNS, PhD 10:40a-11:30a 50 minutes

## 2024-03-09 ENCOUNTER — Ambulatory Visit: Admitting: Psychology

## 2024-03-09 DIAGNOSIS — F4323 Adjustment disorder with mixed anxiety and depressed mood: Secondary | ICD-10-CM

## 2024-03-09 DIAGNOSIS — F411 Generalized anxiety disorder: Secondary | ICD-10-CM

## 2024-03-09 NOTE — Progress Notes (Signed)
 [facility] Counselor Initial Adult Exam  Name: Megan Cain Date: 03/09/2024 MRN: [id number] DOB: 1960-10-02 PCP: [name], MD    Guardian/Payee:  N/A    Paperwork requested: No   Reason for Visit /Presenting Problem: mild depression and anxiety, grief, poor self-esteem  Mental Status Exam: Appearance:   Casual     Behavior:  Appropriate  Motor:  Normal  Speech/Language:   Clear and Coherent  Affect:  Appropriate  Mood:  normal  Thought process:  normal  Thought content:    WNL  Sensory/Perceptual disturbances:    WNL  Orientation:  oriented to person, place, and situation  Attention:  Good  Concentration:  Good  Memory:  WNL  Fund of knowledge:   Good  Insight:    Good  Judgment:   Good  Impulse Control:  Good     Reported Symptoms:  guilt, grief (sadness), depleted sense of self  Risk Assessment: Danger to Self:  No Self-injurious Behavior: No Danger to Others: No Duty to Warn:no Physical Aggression / Violence:No  Access to Firearms a concern: No  Gang Involvement:No  Patient / guardian was educated about steps to take if suicide or homicide risk level increases between visits: n/a While future psychiatric events cannot be accurately  predicted, the patient does not currently require acute inpatient psychiatric care and does not currently meet [facility]  involuntary commitment criteria.  Substance Abuse History:  Current substance abuse: No     Past Psychiatric History:   No previous psychological problems have been observed Outpatient Providers:N/A History of Psych Hospitalization: No  Psychological Testing: N/A   Abuse History:  Victim of: No., N/A   Report needed: No. Victim of Neglect:No. Perpetrator of N/A  Witness / Exposure to Domestic Violence: No   Protective Services Involvement: No  Witness to [employer] Violence:  No   Family History:  Family History  Problem Relation Age of Onset   Dementia Mother    Hypertension Mother    Breast cancer Mother    Dementia Father    Heart disease Father    Hyperlipidemia Father    Stroke Father    Depression Sister    Anxiety disorder Sister    Leukemia Brother    Breast cancer Maternal Aunt    Suicidality Paternal Uncle    Diabetes Son        type 1 in both sons   Diabetes Son    Colon cancer Neg Hx    Stomach cancer Neg Hx    Esophageal cancer Neg Hx    Rectal cancer Neg Hx     Living situation: the patient lives with their spouse  Sexual Orientation: Straight  Relationship Status: married  Name of spouse / other:[name] If a parent, number of children / ages:Two boys, ages 74 and 91  Support Systems: spouse  Surveyor, quantity Stress:  No   Income/Employment/Disability: Employment  Financial planner: No   Educational History: Education: unknown  Oncologist: unknown  Any cultural differences that may affect / interfere with treatment:  not applicable   Recreation/Hobbies: reading  Stressors: Marital or family conflict    Strengths: Supportive Relationships  Barriers:  undetermined   Legal History: Pending legal issue / charges: The patient has no significant history of legal issues. 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 shoulder   SQUAMOUS CELL CARCINOMA EXCISION  09/2015   Right ear   thumb surgery Left 1975    Medications: Current Outpatient Medications  Medication Sig Dispense Refill   amphetamine -dextroamphetamine  (ADDERALL) 30 MG tablet Take 1 tablet by mouth daily. 30 tablet 0   B Complex Vitamins (B COMPLEX PO) Take 1 tablet by mouth daily.     budesonide-formoterol (SYMBICORT) 160-4.5 MCG/ACT inhaler Inhale 2 puffs into the lungs 2 (two) times daily.     Calcium Carbonate (CALTRATE 600 PO) Take 600 mg by mouth 2 (two) times daily.     cholecalciferol (VITAMIN D ) 1000 UNITS tablet Take 1,000 Units by mouth daily.     Coenzyme Q10 (CO Q 10 PO) Take  1 tablet by mouth daily.     Continuous Blood Gluc Sensor (DEXCOM G4 SENSOR) MISC by Does not apply route. G7     Cyanocobalamin (VITAMIN B-12 PO) Take 1 tablet by mouth daily.     fish oil-omega-3 fatty acids 1000 MG capsule Take 2 g by mouth daily.     fluticasone (FLONASE) 50 MCG/ACT nasal spray Place into both nostrils daily.     JARDIANCE 25 MG TABS tablet Take 25 mg by mouth daily.     lamoTRIgine  (LAMICTAL ) 100 MG tablet Take 1 tablet (100 mg total) by mouth 2 (two) times daily. 180 tablet 0   LANTUS SOLOSTAR 100 UNIT/ML Solostar Pen Inject 6 Units into the skin 2 (two) times  daily.     loratadine (CLARITIN) 10 MG tablet Take 10 mg by mouth daily.     MAGNESIUM PO Take 1 tablet by mouth daily.     montelukast (SINGULAIR) 10 MG tablet Take 10 mg by mouth at bedtime.     Multiple Vitamins-Minerals (MULTIVITAMIN WITH MINERALS) tablet Take 1 tablet by mouth daily.     NOVOLOG 100 UNIT/ML injection 100u/day [place] via pump for 90 days     Probiotic Product (PROBIOTIC DAILY PO) Take 1 tablet by mouth daily.     rosuvastatin (CRESTOR) 5 MG tablet Take 5 mg by mouth daily.     thyroid  (ARMOUR) 15 MG tablet Take 15 mg by mouth daily. Takes with 60mg      thyroid  (ARMOUR) 60 MG tablet Take 60 mg by mouth daily before breakfast. Takes with 15mg      Turmeric 450 MG CAPS Take by mouth 2 (two) times daily.     No current facility-administered medications for this visit.    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 She has issues of grief separation and abandonment. Recent diagnosis of type 1 diabetic and insulin  dependent. Her sons are diabetic as well. Most recently consumed with the news that her father's wife is terminally ill. Father died a few years ago, and Hannahmarie had a lot of anger and resentment toward father's wife. She is angry, bitter and hurt toward this woman, who also treated [place] poorly. Says her father's closest friends also had issues with his wife. Dyana knew this woman for almost 30 years and they are very different. They did not really connect well from the start. This woman was very controlling. Margi says I don't like people to dislike me and her step mother did not like her. Clydell feels guilty and fears that family members have a bad perception of her because of her father's wife. In particular, her deceased brother's family had a good relationship with her step-mother because they lived so far away ([place] ).  Her parents  divorced in 74 when Ruweyda was 14. They had separated twice before. It was a bitter divorce and mother did not want the divorce. Nakiya has 1 deceased brother who was 3 years older and a sister 4 years younger (in [place], [place]). Brother died of complications from bone marrow transplant. Her mother in law was diagnosed with cancer around same time and passed away. Her brother was best friends with her husband and they worked together. 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 [place]  and Dorise married 33 years and together 8 before that. They have 2 boys. (Dylan age 62 and [place] age 84). Both live in [place]. Signa lives with girlfriend. Both boys have diabetes. Her marital relationship is up and down. Relationship with mother: She was not a nurturing person. Svara became very independent when her parents divorced. She wonders if mother is on spectrum. She says mom was quirky, especially socially. Mom was not affectionate and they did not share interests. Mom had lots of friends, but not with a lot of depth.  Her grief hits her really hard. Lots of loss in her life. Uncle committed suicide, brother's death and mother in law's death. Leetta gets very sad with separation and thinks it was because of parents separations and divorce. When her mother went away to teach, it was devastating for Mariah and felt that maybe she was to blame.  Her weekly routine: Says she has backed off because they now butt heads. So goes to see her at [facility] once a week and drives her to appointments. She now goes out to visit to help her do laundry and other tasks.  She states that depression runs in her family. She tries to focus on self-care (exercise, socialize). She is invlved in community work. No big hobbies, but likes to read. Rarely drinks (maybe weekly) and occasional weed. Mother was  diagnosed with depression, father and his brother both had depression. Adelyne's sister is also prone to depression. She takes Lamotrigine  for several years. Level of depression is about a 3 of 10. Has some transient anxiety. Sleep and appetite is fine. Does mindfulness and meditation practices.           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 She is also experiencing unresolved grief related to numerous losses and is seeking to work through those feelings. She also reports very low self-esteem. Will explore origins of this esteem issue and develop strategies to resolve/manage. Goal date 12-25. Patient agreed to be seen in provider's office.   Session Note: Melaina talked about some blood pressure issues she has that caused some instability. She had episode when her legs buckled and she fell. Has taken blood pressure meds to stabilize her numbers and it has been a challenge. She talked about a neighbor that recently died and it triggered her grief about the death of her brother in 2025-11-10years ago. Said she talked her way through it and said she is fine. Concerns her that she is not taking the initiative to do enjoyable things/activities.      CONI ALM KERNS, PhD 10:40a-11:30a 50 minutes

## 2024-03-14 ENCOUNTER — Ambulatory Visit (INDEPENDENT_AMBULATORY_CARE_PROVIDER_SITE_OTHER): Admitting: Behavioral Health

## 2024-03-14 ENCOUNTER — Encounter: Payer: Self-pay | Admitting: Behavioral Health

## 2024-03-14 DIAGNOSIS — F900 Attention-deficit hyperactivity disorder, predominantly inattentive type: Secondary | ICD-10-CM | POA: Diagnosis not present

## 2024-03-14 DIAGNOSIS — F411 Generalized anxiety disorder: Secondary | ICD-10-CM | POA: Diagnosis not present

## 2024-03-14 DIAGNOSIS — F330 Major depressive disorder, recurrent, mild: Secondary | ICD-10-CM | POA: Diagnosis not present

## 2024-03-14 MED ORDER — ESCITALOPRAM OXALATE 5 MG PO TABS
5.0000 mg | ORAL_TABLET | Freq: Every day | ORAL | 1 refills | Status: AC
Start: 2024-03-14 — End: ?

## 2024-03-14 MED ORDER — AMPHETAMINE-DEXTROAMPHET ER 25 MG PO CP24
25.0000 mg | ORAL_CAPSULE | ORAL | 0 refills | Status: AC
Start: 2024-03-14 — End: 2024-04-24

## 2024-03-14 NOTE — Progress Notes (Signed)
 [facility]  Patient ID: Megan Cain,  MRN: [id number]  PCP: [name], MD  Date of Evaluation: 03/14/2024 Time spent:30 minutes  Chief Complaint:   HISTORY/CURRENT STATUS: HPI Megan Cain, 63 year old female presents to this office for follow up and medication management.  Collateral information should be considered reliable. Still following up with PCP for BP. It has not been controlled as much as she would like. Says PCP is recommending reducing her Adderall dose to see if BP numbers will move in positive direction. Also reports some mild increase in depression which could also further complicate her focus problems. Would like to discuss AD's.  Rates depression at 3/10 and anxiety at 3/10. Sleep has been ok but inconsistent.  Denies any history of mania, no psychosis, no auditory or visual hallucinations or delirium.  Denies SI or HI.   Past psychiatric medication trials: Zoloft Lamictal    Individual Medical History/ Review of Systems: Changes? :No   Allergies: Lisinopril  Current Medications:  Current Outpatient Medications:    amphetamine -dextroamphetamine  (ADDERALL XR) 25 MG 24 hr capsule, Take 1 capsule by mouth every morning., Disp: 30 capsule, Rfl: 0   escitalopram  (LEXAPRO ) 5 MG tablet, Take 1 tablet (5 mg total) by mouth daily., Disp: 30 tablet, Rfl: 1   amphetamine -dextroamphetamine  (ADDERALL) 30 MG tablet, Take 1 tablet by mouth daily., Disp: 30 tablet, Rfl: 0   B Complex Vitamins (B COMPLEX PO), Take 1 tablet by mouth daily., Disp: , Rfl:    budesonide-formoterol (SYMBICORT) 160-4.5 MCG/ACT inhaler, Inhale 2 puffs into the lungs 2 (two) times daily., Disp: , Rfl:    Calcium Carbonate (CALTRATE 600 PO), Take 600 mg by mouth 2 (two) times daily., Disp: , Rfl:    cholecalciferol (VITAMIN D ) 1000 UNITS tablet, Take 1,000 Units by mouth daily., Disp: , Rfl:    Coenzyme Q10 (CO Q 10 PO), Take 1 tablet by mouth daily., Disp: , Rfl:    Continuous  Blood Gluc Sensor (DEXCOM G4 SENSOR) MISC, by Does not apply route. G7, Disp: , Rfl:    Cyanocobalamin (VITAMIN B-12 PO), Take 1 tablet by mouth daily., Disp: , Rfl:    fish oil-omega-3 fatty acids 1000 MG capsule, Take 2 g by mouth daily., Disp: , Rfl:    fluticasone (FLONASE) 50 MCG/ACT nasal spray, Place into both nostrils daily., Disp: , Rfl:    JARDIANCE 25 MG TABS tablet, Take 25 mg by mouth daily., Disp: , Rfl:    lamoTRIgine  (LAMICTAL ) 100 MG tablet, Take 1 tablet (100 mg total) by mouth 2 (two) times daily., Disp: 180 tablet, Rfl: 0   LANTUS SOLOSTAR 100 UNIT/ML Solostar Pen, Inject 6 Units into the skin 2 (two) times daily., Disp: , Rfl:    loratadine (CLARITIN) 10 MG tablet, Take 10 mg by mouth daily., Disp: , Rfl:    MAGNESIUM PO, Take 1 tablet by mouth daily., Disp: , Rfl:    montelukast (SINGULAIR) 10 MG tablet, Take 10 mg by mouth at bedtime., Disp: , Rfl:    Multiple Vitamins-Minerals (MULTIVITAMIN WITH MINERALS) tablet, Take 1 tablet by mouth daily., Disp: , Rfl:    NOVOLOG 100 UNIT/ML injection, 100u/day [place] via pump for 90 days, Disp: , Rfl:    Probiotic Product (PROBIOTIC DAILY PO), Take 1 tablet by mouth daily., Disp: , Rfl:    rosuvastatin (CRESTOR) 5 MG tablet, Take 5 mg by mouth daily., Disp: , Rfl:    thyroid  (ARMOUR) 15 MG tablet, Take 15 mg by mouth daily. Takes with  60mg , Disp: , Rfl:    thyroid  (ARMOUR) 60 MG tablet, Take 60 mg by mouth daily before breakfast. Takes with 15mg , Disp: , Rfl:    Turmeric 450 MG CAPS, Take by mouth 2 (two) times daily., Disp: , Rfl:  Medication Side Effects: none  Family Medical/ Social History: Changes? No  MENTAL HEALTH EXAM:  Last menstrual period 02/21/2015.There is no height or weight on file to calculate BMI.  General Appearance: Casual, Neat, and Well Groomed  Eye Contact:  Good  Speech:  Clear and Coherent  Volume:  Normal  Mood:  Depressed  Affect:  Congruent  Thought Process:  Coherent  Orientation:  Full (Time, Place,  and Person)  Thought Content: Logical   Suicidal Thoughts:  No  Homicidal Thoughts:  No  Memory:  WNL  Judgement:  Good  Insight:  Good  Psychomotor Activity:  Normal  Concentration:  Concentration: Good  Recall:  Good  Fund of Knowledge: Good  Language: Good  Assets:  Desire for Improvement  ADL's:  Intact  Cognition: WNL  Prognosis:  Good    DIAGNOSES:    ICD-10-CM   1. Attention deficit hyperactivity disorder (ADHD), predominantly inattentive type  F90.0 amphetamine -dextroamphetamine  (ADDERALL XR) 25 MG 24 hr capsule    2. Generalized anxiety disorder  F41.1 escitalopram  (LEXAPRO ) 5 MG tablet    3. Mild episode of recurrent major depressive disorder (HCC)  F33.0 escitalopram  (LEXAPRO ) 5 MG tablet      Receiving Psychotherapy: No    RECOMMENDATIONS:   Greater than 50% of 30  min face to face time with patient was spent on counseling and coordination of care. We discussed her continued problems with controlling BP. She would like to try reducing Adderall today. Also is open to trying low dose AD for mild depressive symptoms.  To continue working with PCP to get BP under control. She is currently on 3 medications for BP.      We agreed today to:   To reduce Adderall to 25  mg  IR daily after breakfast Pt will check BP  regularly. To start Lexapro  5 mg daily after breakfast.  Will continue Lamictal  100 mg daily Will report side effects or worsening symptoms promptly It was recommended that patient monitor weights and blood pressure consistently Will follow-up to this office in 6  weeks to reassess Provided emergency contact information Discussed potential benefits, risks, and side effects of stimulants with patient to include increased heart rate, palpitations, insomnia, increased anxiety, increased irritability, or decreased appetite.  Instructed patient to contact office if experiencing any significant tolerability issues.      Redell DELENA Pizza, NP

## 2024-03-15 ENCOUNTER — Other Ambulatory Visit: Payer: Self-pay

## 2024-03-15 ENCOUNTER — Telehealth: Payer: Self-pay | Admitting: Behavioral Health

## 2024-03-15 DIAGNOSIS — F900 Attention-deficit hyperactivity disorder, predominantly inattentive type: Secondary | ICD-10-CM

## 2024-03-15 MED ORDER — AMPHETAMINE-DEXTROAMPHETAMINE 20 MG PO TABS: 20.0000 mg | ORAL_TABLET | Freq: Every day | ORAL | 0 refills | Status: DC

## 2024-03-15 NOTE — Telephone Encounter (Signed)
 Pt seen yesterday.  To reduce Adderall to 25  mg  IR daily after breakfast   Rx was sent for 25 XR.

## 2024-03-15 NOTE — Telephone Encounter (Signed)
 Pt lvm asking to confirm if provider should have send in Adderall XR 25 mg? Or s/b IR? Contact Pt @ [phone]

## 2024-03-15 NOTE — Telephone Encounter (Signed)
 Pt had not picked up the 25 XR. Pended 20 mg IR, as there isn't a 25 mg IR.

## 2024-03-26 IMAGING — MG MM DIGITAL SCREENING BILAT W/ TOMO AND CAD
8 series · 9 of 24 positions shown · non-contrast
Comparison: Previous exam(s).

CLINICAL DATA: Screening.

EXAM:
DIGITAL SCREENING BILATERAL MAMMOGRAM WITH TOMOSYNTHESIS AND CAD
TECHNIQUE: Bilateral screening digital craniocaudal and mediolateral oblique
mammograms were obtained. Bilateral screening digital breast
tomosynthesis was performed. The images were evaluated with
computer-aided detection.

[R MLO synth-2D]
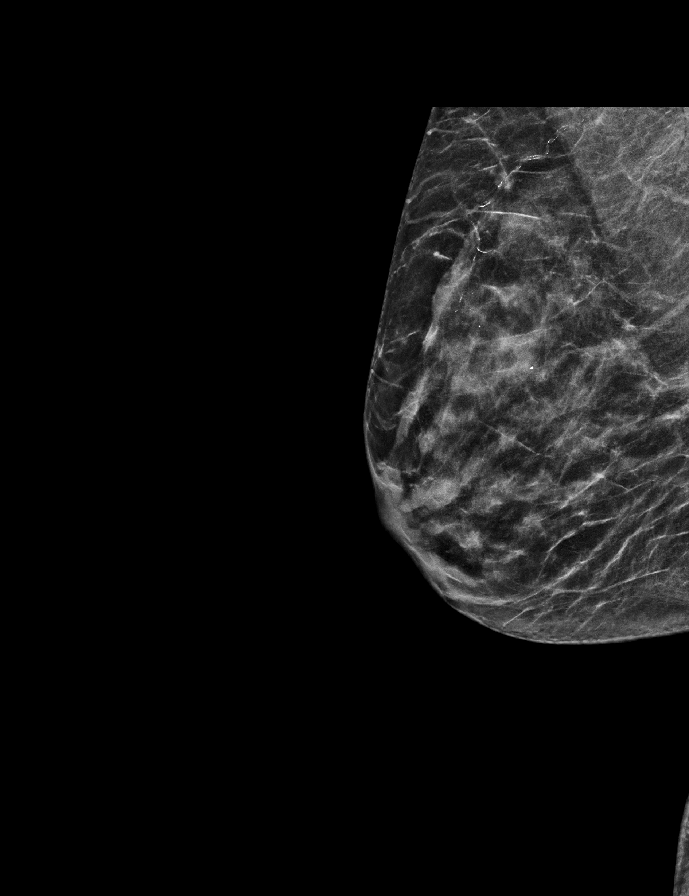

[L CC synth-2D]
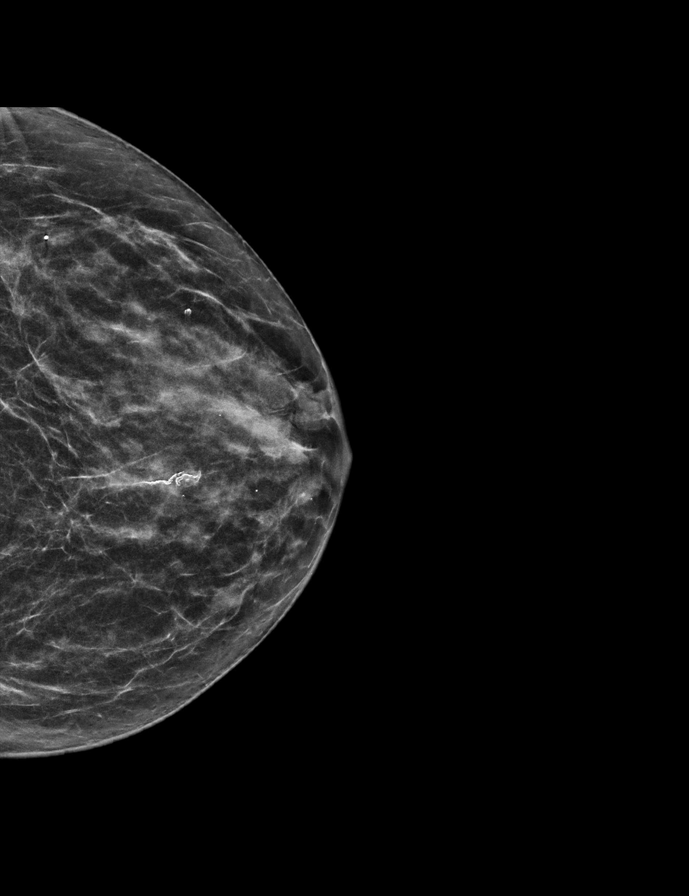

[R CC synth-2D]
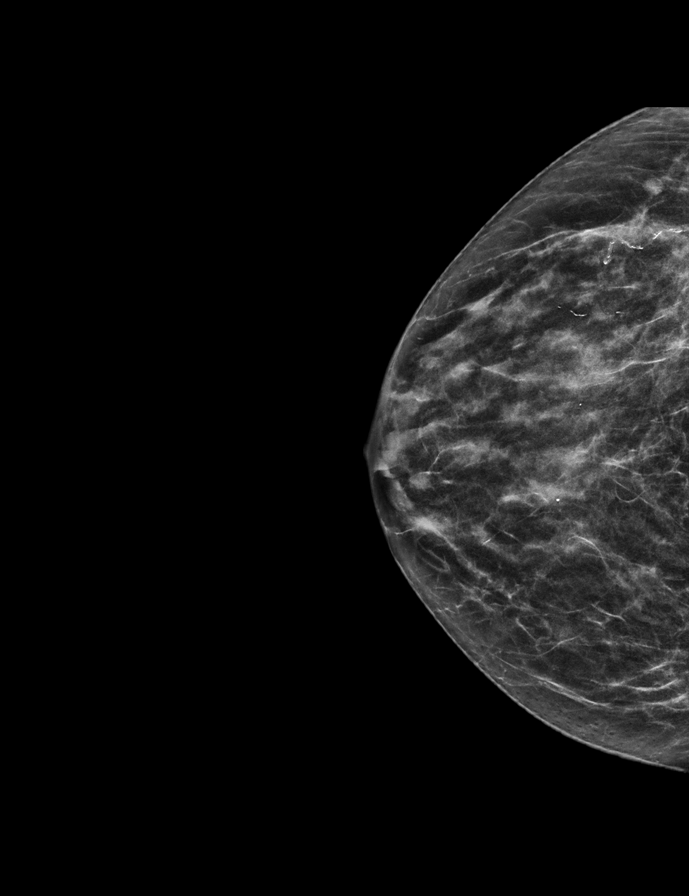

[L MLO synth-2D]
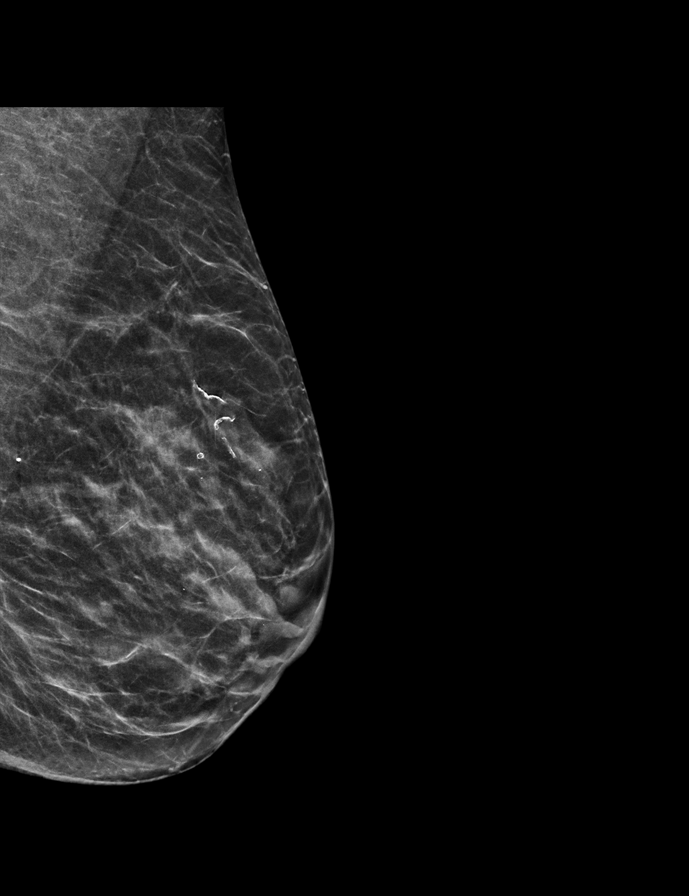

[L CC tomo · 2 of 51 frames shown]
[frame 17/51]
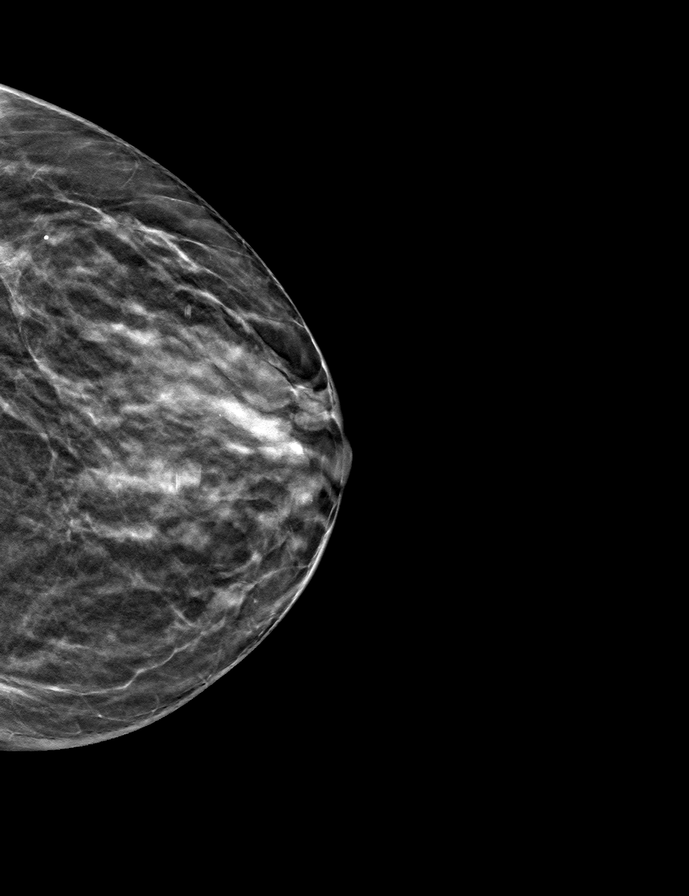
[frame 26/51]
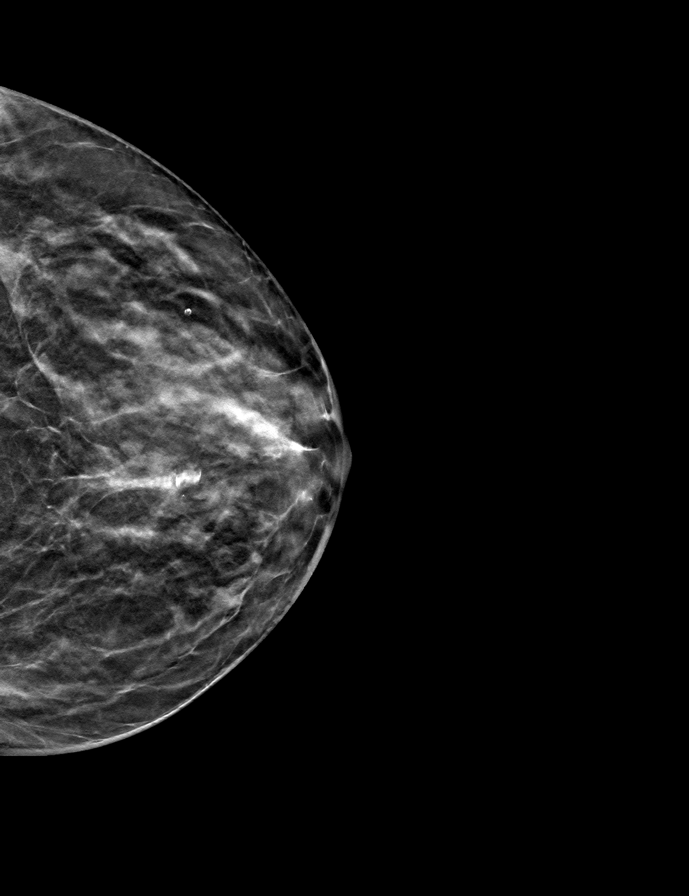

[L MLO tomo · tomo slice 27/52.0]
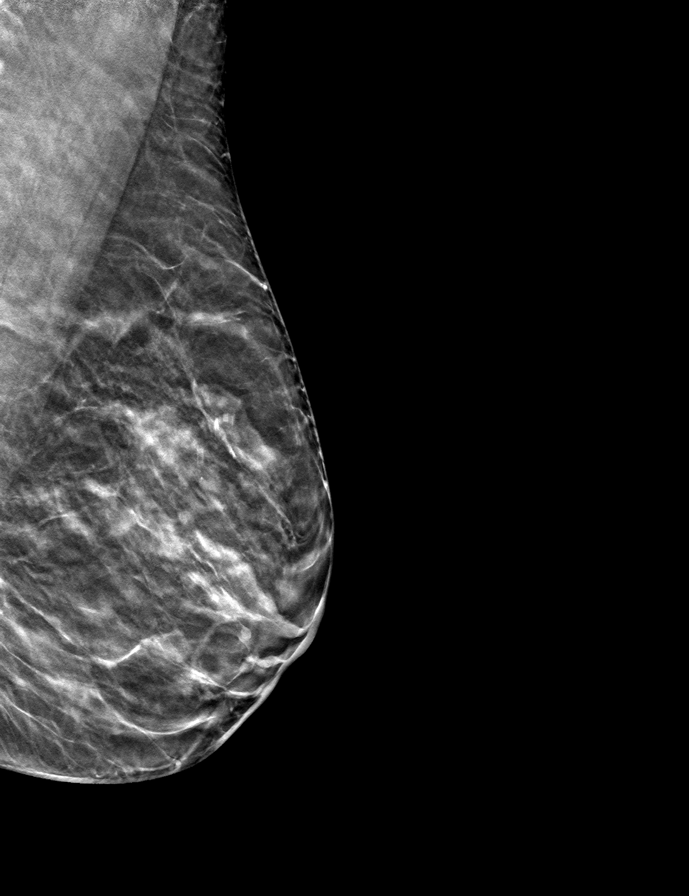

[R MLO tomo · tomo slice 27/53.0]
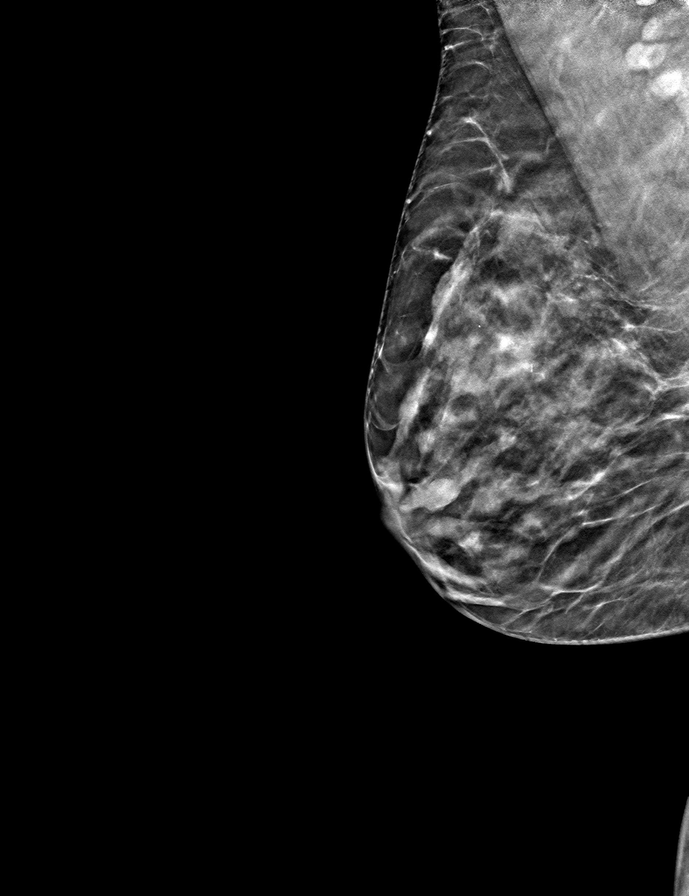

[R CC tomo · tomo slice 24/47.0]
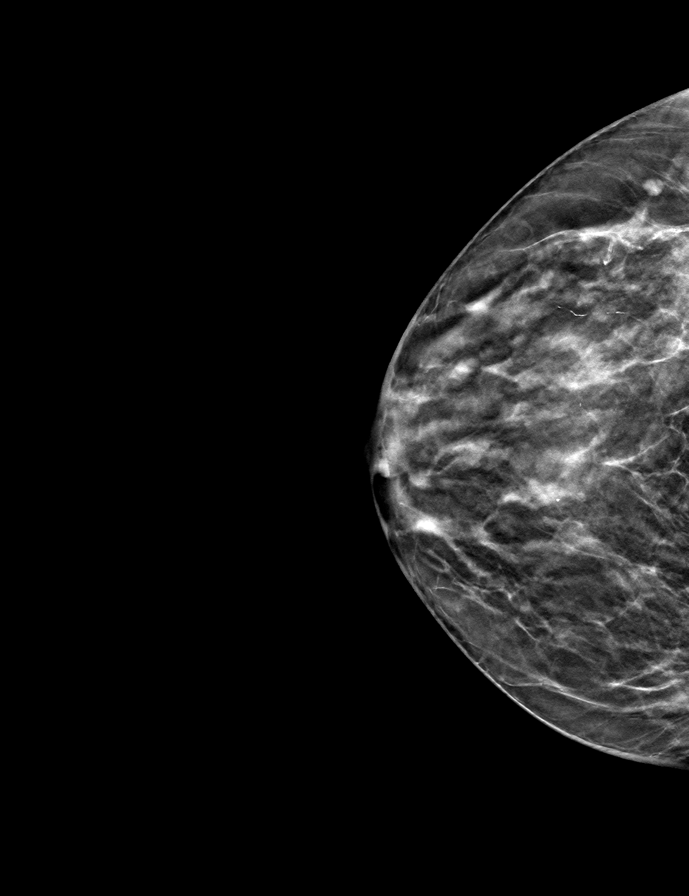

[9 of 24 positions shown; findings below may reference images not displayed]

ACR Breast Density Category c: The breast tissue is heterogeneously
dense, which may obscure small masses.
FINDINGS: There are no findings suspicious for malignancy.
IMPRESSION: No mammographic evidence of malignancy. A result letter of this
screening mammogram will be mailed directly to the patient.

RECOMMENDATION:
Screening mammogram in one year. (Code:Q3-W-BC3)

BI-RADS CATEGORY  1: Negative.

## 2024-03-30 ENCOUNTER — Ambulatory Visit: Admitting: Psychology

## 2024-03-30 DIAGNOSIS — F900 Attention-deficit hyperactivity disorder, predominantly inattentive type: Secondary | ICD-10-CM

## 2024-03-30 DIAGNOSIS — F4323 Adjustment disorder with mixed anxiety and depressed mood: Secondary | ICD-10-CM | POA: Diagnosis not present

## 2024-03-30 NOTE — Progress Notes (Signed)
 [facility] Counselor Initial Adult Exam  Name: Megan Cain [facility] Date: 03/30/2024 MRN: [id number] DOB: [birth year] PCP: [name], MD    Guardian/Payee:  N/A    Paperwork requested: No   Reason for Visit /Presenting Problem: mild depression and anxiety, grief, poor self-esteem  Mental Status Exam: Appearance:   Casual     Behavior:  Appropriate  Motor:  Normal  Speech/Language:   Clear and Coherent  Affect:  Appropriate  Mood:  normal  Thought process:  normal  Thought content:    WNL  Sensory/Perceptual disturbances:    WNL  Orientation:  oriented to person, place, and situation  Attention:  Good  Concentration:  Good  Memory:  WNL  Fund of knowledge:   Good  Insight:    Good  Judgment:   Good  Impulse Control:  Good     Reported Symptoms:  guilt, grief (sadness), depleted sense of self  Risk Assessment: Danger to Self:  No Self-injurious Behavior: No Danger to Others: No Duty to Warn:no Physical Aggression / Violence:No  Access to Firearms a concern: No  Gang Involvement:No  Patient / guardian was educated about steps to take if suicide or homicide risk level increases between visits: n/a While future psychiatric events  cannot be accurately predicted, the patient does not currently require acute inpatient psychiatric  care and does not currently meet [place]  involuntary commitment criteria.  Substance Abuse History: Current substance abuse: No     Past Psychiatric History:   No previous psychological problems have been observed Outpatient Providers:N/A History of Psych Hospitalization: No  Psychological Testing: N/A   Abuse History:  Victim of: No., N/A   Report needed: No. Victim of Neglect:No. Perpetrator of N/A  Witness / Exposure to Domestic Violence: No   Protective Services Involvement: No  Witness to [employer] Violence:  No   Family History:  Family History  Problem Relation Age of Onset   Dementia Mother    Hypertension Mother    Breast cancer Mother    Dementia Father    Heart disease Father    Hyperlipidemia Father    Stroke Father    Depression Sister    Anxiety disorder Sister    Leukemia Brother    Breast cancer Maternal Aunt    Suicidality Paternal Uncle    Diabetes Son        type 1 in both sons   Diabetes Son    Colon cancer Neg Hx    Stomach cancer Neg Hx    Esophageal cancer Neg Hx    Rectal cancer Neg Hx     Living situation: the patient lives with their spouse  Sexual Orientation: Straight  Relationship Status: married  Name of spouse / other:[name] If a parent, number of children / ages:Two boys, ages 52 and 5  Support Systems: spouse  Surveyor, quantity Stress:  No   Income/Employment/Disability: Employment  Financial planner: No   Educational History: Education: unknown  Oncologist: unknown  Any cultural differences that may affect / interfere with treatment:  not applicable   Recreation/Hobbies: reading  Stressors: Marital or family conflict    Strengths: Supportive Relationships  Barriers:  undetermined   Legal History: Pending legal issue / charges: The patient has no significant history of legal issues. 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 shoulder   SQUAMOUS CELL CARCINOMA EXCISION  09/2015   Right ear   thumb surgery Left 1975    Medications: Current Outpatient Medications  Medication Sig Dispense Refill   amphetamine -dextroamphetamine  (ADDERALL XR) 25 MG 24 hr capsule Take 1 capsule by mouth every morning. 30 capsule 0   amphetamine -dextroamphetamine  (ADDERALL) 20 MG tablet Take 1 tablet (20 mg total) by mouth daily. 30 tablet 0   amphetamine -dextroamphetamine  (ADDERALL) 30 MG tablet Take 1 tablet by mouth daily. 30 tablet 0   B Complex Vitamins (B COMPLEX PO) Take 1 tablet by mouth daily.     budesonide-formoterol (SYMBICORT) 160-4.5 MCG/ACT inhaler Inhale  2 puffs into the lungs 2 (two) times daily.     Calcium Carbonate (CALTRATE 600 PO) Take 600 mg by mouth 2 (two) times daily.     cholecalciferol (VITAMIN D ) 1000 UNITS tablet Take 1,000 Units by mouth daily.     Coenzyme Q10 (CO Q 10 PO) Take 1 tablet by mouth daily.     Continuous Blood Gluc Sensor (DEXCOM G4 SENSOR) MISC by Does not apply route. G7     Cyanocobalamin (VITAMIN B-12 PO) Take 1 tablet by mouth daily.     escitalopram  (LEXAPRO ) 5 MG tablet Take 1 tablet (5 mg total) by mouth daily. 30 tablet 1   fish oil-omega-3 fatty acids 1000 MG capsule Take 2 g by mouth daily.      fluticasone (FLONASE) 50 MCG/ACT nasal spray Place into both nostrils daily.     JARDIANCE 25 MG TABS tablet Take 25 mg by mouth daily.     lamoTRIgine  (LAMICTAL ) 100 MG tablet Take 1 tablet (100 mg total) by mouth 2 (two) times daily. 180 tablet 0   LANTUS SOLOSTAR 100 UNIT/ML Solostar Pen Inject 6 Units into the skin 2 (two) times daily.     loratadine (CLARITIN) 10 MG tablet Take 10 mg by mouth daily.     MAGNESIUM PO Take 1 tablet by mouth daily.     montelukast (SINGULAIR) 10 MG tablet Take 10 mg by mouth at bedtime.     Multiple Vitamins-Minerals (MULTIVITAMIN WITH MINERALS) tablet Take 1 tablet by mouth daily.     NOVOLOG 100 UNIT/ML injection 100u/day [place] via pump for 90 days     Probiotic Product (PROBIOTIC DAILY PO) Take 1 tablet by mouth daily.     rosuvastatin (CRESTOR) 5 MG tablet Take 5 mg by mouth daily.     thyroid  (ARMOUR) 15 MG tablet Take 15 mg by mouth daily. Takes with 60mg      thyroid  (ARMOUR) 60 MG tablet Take 60 mg by mouth daily before breakfast. Takes with 15mg      Turmeric 450 MG CAPS Take by mouth 2 (two) times daily.     No current facility-administered medications for this visit.    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 She has issues of grief separation and abandonment. Recent diagnosis of type 1 diabetic and insulin  dependent. Her sons are diabetic as well. Most recently consumed with the news that her father's wife is terminally ill. Father died a few years ago, and Omni had a lot of anger and resentment toward father's wife. She is angry, bitter and hurt toward this woman, who also treated [place] poorly. Says her father's closest friends also had issues with his wife. Naia knew this woman for almost 30 years and they are very different. They did not really connect well from the start. This woman was very controlling. Darbi  says I don't like people to dislike me and her step mother did not like her. Reilly feels guilty and fears that family members have a bad perception of her because of her father's wife. In particular, her deceased brother's family had a good relationship with her step-mother because they lived so far away ([place] ).  Her parents divorced in 54 when Akhila was 68. They had separated twice before. It was a bitter divorce and mother did not want the divorce. Kennadi has 1 deceased brother who  was 3 years older and a sister 4 years younger (in [place], [place]). Brother died of complications from bone marrow transplant. Her mother in law was diagnosed with cancer around same time and passed away. Her brother was best friends with her husband and they worked together. 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 Kylene and Dorise married 33 years and together 8 before that. They have 2 boys. (Dylan age 70 and [place] age 33). Both live in [place]. Signa lives with girlfriend. Both boys have diabetes. Her marital relationship is up and down. Relationship with mother: She was not a nurturing person. Jilliana became very independent when her parents divorced. She wonders if mother is on spectrum. She says mom was quirky, especially socially. Mom was not affectionate and they did not share interests. Mom had lots of friends, but not with a lot of depth.  Her grief hits her really hard. Lots of loss in her life. Uncle committed suicide, brother's death and mother in law's death. Valena gets very sad with separation and thinks it was because of parents separations and divorce. When her mother went away to teach, it was devastating for Onna and felt that maybe she was to blame.  Her weekly routine: Says she has backed off because they now butt heads. So goes [facility] at [facility] once a  week and drives her to appointments. She now goes out to visit to help her do laundry and other tasks.  She states that depression runs in her family. She tries to focus on self-care (exercise, socialize). She is invlved in community work. No big hobbies, but likes to read. Rarely drinks (maybe weekly) and occasional weed. Mother was diagnosed with depression, father and his brother both had depression. Lekesha's sister is also prone to depression. She takes Lamotrigine  for several years. Level of depression is about a 3 of 10. Has some transient anxiety. Sleep and appetite is fine. Does mindfulness and meditation practices.           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 She is also experiencing unresolved grief related to numerous losses and is seeking to work through those feelings. She also reports very low self-esteem. Will explore origins of this esteem issue and develop strategies to resolve/manage. Goal date 12-25. Patient agreed to be seen in provider's office.   Session Note: Lena says she has been doing well and anxiety has been manageable. Did have one explosive interaction with her mother. She got frustrated with mother as she tried to get her to speed up getting ready to go to doctor's office. Her mother snapped back at [place], and that [employer] off. We talked about triggers for her and ways to disarm them. She is saying she sees her own self or potential future when she sees her mother. Is working with a PT to help her with certain movements. Is doing a good job with self-care. Emotionally feeling stable.      CONI ALM KERNS, PhD 10:40a-11:30a 50  minutes

## 2024-04-12 ENCOUNTER — Telehealth: Payer: Self-pay | Admitting: Behavioral Health

## 2024-04-12 NOTE — Telephone Encounter (Signed)
 Pt is requesting RF on Adderall 20mg  dose. Please send in to [facility].

## 2024-04-13 ENCOUNTER — Other Ambulatory Visit: Payer: Self-pay

## 2024-04-13 DIAGNOSIS — F900 Attention-deficit hyperactivity disorder, predominantly inattentive type: Secondary | ICD-10-CM

## 2024-04-13 MED ORDER — AMPHETAMINE-DEXTROAMPHETAMINE 20 MG PO TABS: 20.0000 mg | ORAL_TABLET | Freq: Every day | ORAL | 0 refills | Status: AC

## 2024-04-13 MED ORDER — AMPHETAMINE-DEXTROAMPHETAMINE 20 MG PO TABS: 20.0000 mg | ORAL_TABLET | Freq: Every day | ORAL | 0 refills | Status: DC

## 2024-04-13 NOTE — Telephone Encounter (Signed)
 Pended

## 2024-04-24 ENCOUNTER — Encounter: Payer: Self-pay | Admitting: Obstetrics and Gynecology

## 2024-04-24 ENCOUNTER — Ambulatory Visit (INDEPENDENT_AMBULATORY_CARE_PROVIDER_SITE_OTHER): Payer: Self-pay | Admitting: Obstetrics and Gynecology

## 2024-04-24 ENCOUNTER — Other Ambulatory Visit (HOSPITAL_COMMUNITY)
Admission: RE | Admit: 2024-04-24 | Discharge: 2024-04-24 | Disposition: A | Discharge status: Home / Self Care | Source: Ambulatory Visit | Attending: Obstetrics and Gynecology | Admitting: Obstetrics and Gynecology

## 2024-04-24 VITALS — BP 126/84 | HR 66 | Ht 63.75 in | Wt 136.0 lb

## 2024-04-24 DIAGNOSIS — Z87410 Personal history of cervical dysplasia: Secondary | ICD-10-CM | POA: Diagnosis present

## 2024-04-24 DIAGNOSIS — Z124 Encounter for screening for malignant neoplasm of cervix: Secondary | ICD-10-CM | POA: Diagnosis present

## 2024-04-24 DIAGNOSIS — Z01419 Encounter for gynecological examination (general) (routine) without abnormal findings: Secondary | ICD-10-CM | POA: Diagnosis not present

## 2024-04-24 DIAGNOSIS — Z1331 Encounter for screening for depression: Secondary | ICD-10-CM

## 2024-04-24 NOTE — Progress Notes (Signed)
 63 y.o. G3P2 Married Caucasian female here for annual exam.    No GYN concerns.   Hx miscarriage 27 years ago.  Asking about the analysis of the tissue and whether or not gender was determined.  No dx of recurrent miscarriage.   PCP: [name], MD   Patient's last menstrual period was 02/21/2015 (exact date).           Sexually active: Yes.    The current method of family planning is post menopausal status.    Menopausal hormone therapy:  n/a Exercising: Yes.    Yoga, pilates, weight lifting, and cardio  Smoker:  no  OB History  Gravida Para Term Preterm AB Living  3 2    2   SAB IAB Ectopic Multiple Live Births          # Outcome Date GA Lbr Len/2nd Weight Sex Type Anes PTL Lv  3 Gravida           2 Para           1 Para              HEALTH MAINTENANCE: Last 2 paps:  10/29/22 ASCUS, HR HPV neg, 10/03/19 neg, HR HPV neg History of abnormal Pap or positive HPV:  yes Mammogram:   12/07/23 Breast Density Cat C, BIRADS Cat 1 neg  Colonoscopy:  04/10/22  Bone Density:  09/20/23  Result  osteopenia of spine and left hip.  FRAX:  19%/1.3%  Immunization History  Administered Date(s) Administered   Influenza,inj,Quad PF,6+ Mos 04/26/2018   PFIZER(Purple Top)SARS-COV-2 Vaccination 09/22/2019   Tdap 05/13/2006, 02/11/2016   Zoster Recombinant(Shingrix) 06/12/2018, 09/15/2018      reports that she has never smoked. She has never used smokeless tobacco. She reports current alcohol use of about 1.0 standard drink of alcohol per week. She reports that she does not use drugs.  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 shoulder   SQUAMOUS CELL CARCINOMA EXCISION  09/2015   Right ear   thumb surgery Left 1975   TRIGGER FINGER RELEASE  05/2023    Current Outpatient Medications  Medication Sig Dispense Refill   albuterol (VENTOLIN HFA) 108 (90 Base) MCG/ACT inhaler 1 puff as needed Inhalation every 4 hrs     amphetamine -dextroamphetamine  (ADDERALL XR) 25 MG 24 hr capsule Take 1 capsule by mouth every morning. 30 capsule 0   amphetamine -dextroamphetamine  (ADDERALL) 20 MG tablet Take 1 tablet (20 mg total) by mouth daily. 30 tablet 0   [START ON 05/11/2024] amphetamine -dextroamphetamine  (ADDERALL) 20 MG tablet Take 1 tablet (20 mg total) by mouth daily. 30 tablet  0   B Complex Vitamins (B COMPLEX PO) Take 1 tablet by mouth daily.     Calcium Carbonate (CALTRATE 600 PO) Take 600 mg by mouth 2 (two) times daily.     carvedilol (COREG) 6.25 MG tablet Take 6.25 mg by mouth 2 (two) times daily.     cholecalciferol (VITAMIN D ) 1000 UNITS tablet Take 1,000 Units by mouth daily.     Coenzyme Q10 (CO Q 10 PO) Take 1 tablet by mouth daily.     Continuous Blood Gluc Sensor (DEXCOM G4 SENSOR) MISC by Does not apply route. G7     Cyanocobalamin (VITAMIN B-12 PO) Take 1 tablet by mouth daily.     escitalopram  (LEXAPRO ) 5 MG tablet Take 1 tablet (5 mg total) by mouth daily. 30 tablet 1   fish oil-omega-3 fatty acids 1000 MG capsule Take 2 g by mouth daily.     fluticasone (FLONASE) 50 MCG/ACT nasal spray Place into both nostrils daily.     JARDIANCE 25 MG TABS tablet Take 25 mg by mouth daily.     lamoTRIgine  (LAMICTAL ) 100 MG tablet Take 1 tablet (100 mg total) by mouth 2 (two) times daily. 180 tablet 0   LANTUS SOLOSTAR 100 UNIT/ML Solostar Pen  Inject 6 Units into the skin 2 (two) times daily.     loratadine (CLARITIN) 10 MG tablet Take 10 mg by mouth daily.     losartan (COZAAR) 100 MG tablet Take 100 mg by mouth daily.     MAGNESIUM PO Take 1 tablet by mouth daily.     Multiple Vitamins-Minerals (MULTIVITAMIN WITH MINERALS) tablet Take 1 tablet by mouth daily.     NOVOLOG 100 UNIT/ML injection 100u/day [place] via pump for 90 days     Probiotic Product (PROBIOTIC DAILY PO) Take 1 tablet by mouth daily.     rosuvastatin (CRESTOR) 5 MG tablet Take 5 mg by mouth daily.     spironolactone (ALDACTONE) 25 MG tablet Take 25 mg by mouth daily.     thyroid  (ARMOUR) 15 MG tablet Take 15 mg by mouth daily. Takes with 60mg      thyroid  (ARMOUR) 60 MG tablet Take 60 mg by mouth daily before breakfast. Takes with 15mg      Turmeric 450 MG CAPS Take by mouth 2 (two) times daily.     amphetamine -dextroamphetamine  (ADDERALL) 30 MG tablet Take 1 tablet by mouth daily. (Patient not taking: Reported on 04/24/2024) 30 tablet 0   No current facility-administered medications for this visit.    ALLERGIES: Lisinopril  Family History  Problem Relation Age of Onset   Dementia Mother    Hypertension Mother    Breast cancer Mother    Dementia Father    Heart disease Father    Hyperlipidemia Father    Stroke Father    Depression Sister    Anxiety disorder Sister    Leukemia Brother    Breast cancer Maternal Aunt    Suicidality Paternal Uncle    Diabetes Son        type 1 in both sons   Diabetes Son    Colon cancer Neg Hx    Stomach cancer Neg Hx    Esophageal cancer Neg Hx    Rectal cancer Neg Hx     Review of Systems  All other systems reviewed and are negative.   PHYSICAL EXAM:  BP 126/84 (BP Location: Left Arm, Patient Position: Sitting)   Pulse 66   Ht 5' 3.75 (1.619 m)   Wt 136  lb (61.7 kg)   LMP 02/21/2015 (Exact Date)   SpO2 98%   BMI 23.53 kg/m     General appearance: alert, cooperative and appears stated age Head:  normocephalic, without obvious abnormality, atraumatic Neck: no adenopathy, supple, symmetrical, trachea midline and thyroid  normal to inspection and palpation Lungs: clear to auscultation bilaterally Breasts: normal appearance, no masses or tenderness, No nipple retraction or dimpling, No nipple discharge or bleeding, No axillary adenopathy Heart: regular rate and rhythm Abdomen: soft, non-tender; no masses, no organomegaly Extremities: extremities normal, atraumatic, no cyanosis or edema Skin: skin color, texture, turgor normal. No rashes or lesions Lymph nodes: cervical, supraclavicular, and axillary nodes normal. Neurologic: grossly normal  Pelvic: External genitalia:  no lesions              No abnormal inguinal nodes palpated.              Urethra:  normal appearing urethra with no masses, tenderness or lesions              Bartholins and Skenes: normal                 Vagina: normal appearing vagina with normal color and discharge, no lesions              Cervix: no lesions              Pap taken: yes Bimanual Exam:  Uterus:  normal size, contour, position, consistency, mobility, non-tender              Adnexa: no mass, fullness, tenderness              Rectal exam: yes.  Confirms.              Anus:  normal sphincter tone, no lesions  Chaperone was present for exam:  Kari HERO, CMA  ASSESSMENT: Well woman visit with gynecologic exam. Hx cervical conization for CIS, 1990.   Pap last year ASCUS, neg HR HPV. Menopausal female. Osteopenia.  PHQ-2-9: 0  PLAN: Mammogram screening discussed. Self breast awareness reviewed. Pap and HRV collected:  yes Guidelines for Calcium, Vitamin D , regular exercise program including cardiovascular and weight bearing exercise. Medication refills:  NA Labs with PCP and endocrinology.  Follow up:  yearly and prn.

## 2024-04-24 NOTE — Patient Instructions (Signed)

## 2024-04-25 ENCOUNTER — Ambulatory Visit: Admitting: Behavioral Health

## 2024-04-25 ENCOUNTER — Encounter: Payer: Self-pay | Admitting: Behavioral Health

## 2024-04-25 DIAGNOSIS — F900 Attention-deficit hyperactivity disorder, predominantly inattentive type: Secondary | ICD-10-CM | POA: Diagnosis not present

## 2024-04-25 DIAGNOSIS — F411 Generalized anxiety disorder: Secondary | ICD-10-CM

## 2024-04-25 DIAGNOSIS — F330 Major depressive disorder, recurrent, mild: Secondary | ICD-10-CM

## 2024-04-25 MED ORDER — ESCITALOPRAM OXALATE 10 MG PO TABS: 10.0000 mg | ORAL_TABLET | Freq: Every day | ORAL | 2 refills | Status: DC

## 2024-04-25 NOTE — Progress Notes (Signed)
 [facility]  Patient ID: KALIE CABRAL,  MRN: [id number]  PCP: [name], MD  Date of Evaluation: 04/25/2024 Time spent:30 minutes  Chief Complaint:  Chief Complaint   Anxiety; Depression; Follow-up; Patient Education; Medication Refill; ADHD     HISTORY/CURRENT STATUS: HPI Megan Cain, 63 year old female presents to this office for follow up and medication management.  Collateral information should be considered reliable.  Patient reports that she is monitoring blood pressure closely.  Says that since lowering the dose of Adderall it is not quite as effective but manageable.  Today she is concerned about the rapidly approaching time change and less sunlight.  She typically experiences some seasonal affect.  She has questions about increasing her dose of Lexapro  today to try to avoid any problems.  Will rates depression at 3/10 and anxiety at 3/10. Sleep has been ok but inconsistent.  Denies any history of mania, no psychosis, no auditory or visual hallucinations or delirium.  Denies SI or HI.   Past psychiatric medication trials: Zoloft Lamictal    Individual Medical History/ Review of Systems: Changes? :No   Allergies: Lisinopril  Current Medications:  Current Outpatient Medications:    escitalopram  (LEXAPRO ) 10 MG tablet, Take 1 tablet (10 mg total) by mouth daily., Disp: 30 tablet, Rfl: 2   albuterol (VENTOLIN HFA) 108 (90 Base) MCG/ACT inhaler, 1 puff as needed Inhalation every 4 hrs, Disp: , Rfl:    amphetamine -dextroamphetamine  (ADDERALL XR) 25 MG 24 hr capsule, Take 1 capsule by mouth every morning., Disp: 30 capsule, Rfl: 0   amphetamine -dextroamphetamine  (ADDERALL) 20 MG tablet, Take 1 tablet (20 mg total) by mouth daily., Disp: 30 tablet, Rfl: 0   [START ON 05/11/2024] amphetamine -dextroamphetamine  (ADDERALL) 20 MG tablet, Take 1 tablet (20 mg total) by mouth daily., Disp: 30 tablet, Rfl: 0   amphetamine -dextroamphetamine  (ADDERALL) 30 MG tablet,  Take 1 tablet by mouth daily. (Patient not taking: Reported on 04/24/2024), Disp: 30 tablet, Rfl: 0   B Complex Vitamins (B COMPLEX PO), Take 1 tablet by mouth daily., Disp: , Rfl:    Calcium Carbonate (CALTRATE 600 PO), Take 600 mg by mouth 2 (two) times daily., Disp: , Rfl:    carvedilol (COREG) 6.25 MG tablet, Take 6.25 mg by mouth 2 (two) times daily., Disp: , Rfl:    cholecalciferol (VITAMIN D ) 1000 UNITS tablet, Take 1,000 Units by mouth daily., Disp: , Rfl:    Coenzyme Q10 (CO Q 10 PO), Take 1 tablet by mouth daily., Disp: , Rfl:    Continuous Blood Gluc Sensor (DEXCOM G4 SENSOR) MISC, by Does not apply route. G7, Disp: , Rfl:    Cyanocobalamin (VITAMIN B-12 PO), Take 1 tablet by mouth daily., Disp: , Rfl:    escitalopram  (LEXAPRO ) 5 MG tablet, Take 1 tablet (5 mg total) by mouth daily., Disp: 30 tablet, Rfl: 1   fish oil-omega-3 fatty acids 1000 MG capsule, Take 2 g by mouth daily., Disp: , Rfl:    fluticasone (FLONASE) 50 MCG/ACT nasal spray, Place into both nostrils daily., Disp: , Rfl:    JARDIANCE 25 MG TABS tablet, Take 25 mg by mouth daily., Disp: , Rfl:    lamoTRIgine  (LAMICTAL ) 100 MG tablet, Take 1 tablet (100 mg total) by mouth 2 (two) times daily., Disp: 180 tablet, Rfl: 0   LANTUS SOLOSTAR 100 UNIT/ML Solostar Pen, Inject 6 Units into the skin 2 (two) times daily., Disp: , Rfl:    loratadine (CLARITIN) 10 MG tablet, Take 10 mg by mouth daily., Disp: ,  Rfl:    losartan (COZAAR) 100 MG tablet, Take 100 mg by mouth daily., Disp: , Rfl:    MAGNESIUM PO, Take 1 tablet by mouth daily., Disp: , Rfl:    Multiple Vitamins-Minerals (MULTIVITAMIN WITH MINERALS) tablet, Take 1 tablet by mouth daily., Disp: , Rfl:    NOVOLOG 100 UNIT/ML injection, 100u/day [place] via pump for 90 days, Disp: , Rfl:    Probiotic Product (PROBIOTIC DAILY PO), Take 1 tablet by mouth daily., Disp: , Rfl:    rosuvastatin (CRESTOR) 5 MG tablet, Take 5 mg by mouth daily., Disp: , Rfl:    spironolactone (ALDACTONE) 25  MG tablet, Take 25 mg by mouth daily., Disp: , Rfl:    thyroid  (ARMOUR) 15 MG tablet, Take 15 mg by mouth daily. Takes with 60mg , Disp: , Rfl:    thyroid  (ARMOUR) 60 MG tablet, Take 60 mg by mouth daily before breakfast. Takes with 15mg , Disp: , Rfl:    Turmeric 450 MG CAPS, Take by mouth 2 (two) times daily., Disp: , Rfl:  Medication Side Effects: none  Family Medical/ Social History: Changes? No  MENTAL HEALTH EXAM:  Last menstrual period 02/21/2015.There is no height or weight on file to calculate BMI.  General Appearance: Casual, Neat, and Well Groomed  Eye Contact:  Good  Speech:  Clear and Coherent  Volume:  Normal  Mood:  NA  Affect:  Appropriate  Thought Process:  Coherent  Orientation:  Full (Time, Place, and Person)  Thought Content: Logical   Suicidal Thoughts:  No  Homicidal Thoughts:  No  Memory:  WNL  Judgement:  Good  Insight:  Good  Psychomotor Activity:  Normal  Concentration:  Concentration: Good  Recall:  Good  Fund of Knowledge: Good  Language: Good  Assets:  Desire for Improvement  ADL's:  Intact  Cognition: WNL  Prognosis:  Good    DIAGNOSES:    ICD-10-CM   1. Attention deficit hyperactivity disorder (ADHD), predominantly inattentive type  F90.0     2. Generalized anxiety disorder  F41.1 escitalopram  (LEXAPRO ) 10 MG tablet    3. Mild episode of recurrent major depressive disorder  F33.0 escitalopram  (LEXAPRO ) 10 MG tablet      Receiving Psychotherapy: No    RECOMMENDATIONS:   Greater than 50% of 30  min face to face time with patient was spent on counseling and coordination of care.  We discussed her recent debility with Adderall.  Is not where she would like to be but understands the complications from high blood pressure.  We addressed seasonal affect in the upcoming time change.  Agreed today that she could benefit from increasing her Lexapro .  To continue working with PCP to get BP under control. She is currently on 3 medications for BP.       We agreed today to:   To continue Adderall to 25  mg  IR daily after breakfast Pt will check BP  regularly. Will increase Lexapro  10 mg daily after breakfast.  Will continue Lamictal  100 mg daily Will report side effects or worsening symptoms promptly It was recommended that patient monitor weights and blood pressure consistently Will follow-up to this office in 6  weeks to reassess Provided emergency contact information Discussed potential benefits, risks, and side effects of stimulants with patient to include increased heart rate, palpitations, insomnia, increased anxiety, increased irritability, or decreased appetite.  Instructed patient to contact office if experiencing any significant tolerability issues.    Redell DELENA Pizza, NP

## 2024-04-28 LAB — CYTOLOGY - PAP
Comment: NEGATIVE
Diagnosis: NEGATIVE
High risk HPV: NEGATIVE

## 2024-05-01 ENCOUNTER — Ambulatory Visit: Payer: Self-pay | Admitting: Obstetrics and Gynecology

## 2024-05-02 ENCOUNTER — Ambulatory Visit: Admitting: Psychology

## 2024-05-02 DIAGNOSIS — F4323 Adjustment disorder with mixed anxiety and depressed mood: Secondary | ICD-10-CM

## 2024-05-02 DIAGNOSIS — F900 Attention-deficit hyperactivity disorder, predominantly inattentive type: Secondary | ICD-10-CM

## 2024-05-02 NOTE — Progress Notes (Signed)
  Behavioral Health Counselor Initial Adult Exam  Name: Megan Cain Date: 05/02/2024 MRN: [id number] DOB: 16-Mar-1961 PCP: Megan Darice CROME, MD    Guardian/Payee:  N/A    Paperwork requested: No   Reason for Visit /Presenting Problem: mild depression and anxiety, grief, poor self-esteem  Mental Status Exam: Appearance:   Casual     Behavior:  Appropriate  Motor:  Normal  Speech/Language:   Clear and Coherent  Affect:  Appropriate  Mood:  normal  Thought process:  normal  Thought content:    WNL  Sensory/Perceptual disturbances:    WNL  Orientation:  oriented to person, place, and situation  Attention:  Good  Concentration:  Good  Memory:  WNL  Fund of knowledge:   Good  Insight:    Good  Judgment:   Good  Impulse Control:  Good     Reported Symptoms:  guilt, grief (sadness), depleted sense of self  Risk Assessment: Danger to Self:  No Self-injurious Behavior: No Danger to Others: No Duty to Warn:no Physical Aggression / Violence:No  Access to Firearms a concern: No  Gang Involvement:No  Patient / guardian was educated about steps to take if suicide or homicide risk level increases between visits:  n/a While future  psychiatric events cannot be accurately predicted, the patient does not currently require acute inpatient psychiatric care and does not currently meet [place]  involuntary commitment criteria.  Substance Abuse History: Current substance abuse: No     Past Psychiatric History:   No previous psychological problems have been observed Outpatient Providers:N/A History of Psych Hospitalization: No  Psychological Testing: N/A   Abuse History:  Victim of: No., N/A   Report needed: No. Victim of Neglect:No. Perpetrator of N/A  Witness / Exposure to Domestic Violence: No   Protective Services Involvement: No  Witness to [employer] Violence:  No   Family History:  Family History  Problem Relation Age of Onset   Dementia Mother    Hypertension Mother    Breast cancer Mother    Dementia Father    Heart disease Father    Hyperlipidemia Father    Stroke Father    Depression Sister    Anxiety disorder Sister    Leukemia Brother    Breast cancer Maternal Aunt    Suicidality Paternal Uncle    Diabetes Son        type 1 in both sons   Diabetes Son    Colon cancer Neg Hx    Stomach cancer Neg Hx    Esophageal cancer Neg Hx    Rectal cancer Neg Hx     Living situation: the patient lives with their spouse  Sexual Orientation: Straight  Relationship Status: married  Name of spouse / other:Megan Cain If a parent, number of children / ages:Two boys, ages 20 and 48  Support Systems: spouse  Surveyor, quantity Stress:  No   Income/Employment/Disability: Employment  Financial planner: No   Educational History: Education: unknown  Oncologist: unknown  Any cultural differences that may affect / interfere with treatment:  not applicable   Recreation/Hobbies: reading  Stressors: Marital or family conflict    Strengths: Supportive Relationships  Barriers:  undetermined   Legal History: Pending legal issue / charges: The patient has no  significant history of legal issues. 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 shoulder   SQUAMOUS CELL CARCINOMA EXCISION  09/2015   Right ear   thumb surgery Left 1975   TRIGGER FINGER RELEASE  05/2023    Medications: Current Outpatient Medications  Medication Sig Dispense Refill   albuterol (VENTOLIN HFA) 108 (90 Base) MCG/ACT inhaler 1 puff as needed Inhalation every 4 hrs     amphetamine -dextroamphetamine  (ADDERALL XR) 25 MG 24 hr capsule Take 1 capsule by mouth every morning. 30 capsule 0   amphetamine -dextroamphetamine  (ADDERALL)  20 MG tablet Take 1 tablet (20 mg total) by mouth daily. 30 tablet 0   [START ON 05/11/2024] amphetamine -dextroamphetamine  (ADDERALL) 20 MG tablet Take 1 tablet (20 mg total) by mouth daily. 30 tablet 0   amphetamine -dextroamphetamine  (ADDERALL) 30 MG tablet Take 1 tablet by mouth daily. (Patient not taking: Reported on 04/24/2024) 30 tablet 0   B Complex Vitamins (B COMPLEX PO) Take 1 tablet by mouth daily.     Calcium Carbonate (CALTRATE 600 PO) Take 600 mg by mouth 2 (two) times daily.     carvedilol (COREG) 6.25 MG tablet Take 6.25 mg by mouth 2 (two) times daily.     cholecalciferol (VITAMIN D ) 1000 UNITS tablet Take 1,000 Units by  mouth daily.     Coenzyme Q10 (CO Q 10 PO) Take 1 tablet by mouth daily.     Continuous Blood Gluc Sensor (DEXCOM G4 SENSOR) MISC by Does not apply route. G7     Cyanocobalamin (VITAMIN B-12 PO) Take 1 tablet by mouth daily.     escitalopram  (LEXAPRO ) 10 MG tablet Take 1 tablet (10 mg total) by mouth daily. 30 tablet 2   escitalopram  (LEXAPRO ) 5 MG tablet Take 1 tablet (5 mg total) by mouth daily. 30 tablet 1   fish oil-omega-3 fatty acids 1000 MG capsule Take 2 g by mouth daily.     fluticasone (FLONASE) 50 MCG/ACT nasal spray Place into both nostrils daily.     JARDIANCE 25 MG TABS tablet Take 25 mg by mouth daily.     lamoTRIgine  (LAMICTAL ) 100 MG tablet Take 1 tablet (100 mg total) by mouth 2 (two) times daily. 180 tablet 0   LANTUS SOLOSTAR 100 UNIT/ML Solostar Pen Inject 6 Units into the skin 2 (two) times daily.     loratadine (CLARITIN) 10 MG tablet Take 10 mg by mouth daily.     losartan (COZAAR) 100 MG tablet Take 100 mg by mouth daily.     MAGNESIUM PO Take 1 tablet by mouth daily.     Multiple Vitamins-Minerals (MULTIVITAMIN WITH MINERALS) tablet Take 1 tablet by mouth daily.     NOVOLOG 100 UNIT/ML injection 100u/day [employer] via pump for 90 days     Probiotic Product (PROBIOTIC DAILY PO) Take 1 tablet by mouth daily.     rosuvastatin (CRESTOR) 5 MG tablet Take 5 mg by mouth daily.     spironolactone (ALDACTONE) 25 MG tablet Take 25 mg by mouth daily.     thyroid  (ARMOUR) 15 MG tablet Take 15 mg by mouth daily. Takes with 60mg      thyroid  (ARMOUR) 60 MG tablet Take 60 mg by mouth daily before breakfast. Takes with 15mg      Turmeric 450 MG CAPS Take by mouth 2 (two) times daily.     No current facility-administered medications for this visit.    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 She has issues of grief separation and abandonment. Recent diagnosis  of type 1 diabetic and insulin  dependent. Her sons are diabetic as well. Most recently consumed with the news that her father's wife is terminally ill. Father died a few years ago, and Chrystina had a lot of anger and resentment toward father's wife. She is angry, bitter and hurt toward this woman, who also treated [place] poorly. Says her father's closest friends also had issues with his wife. [place]  knew this woman for almost 30 years and they are very different. They did not really connect well from the start. This woman was very controlling. Mamye says I don't like people to dislike me and her step mother did not like her. Emiley feels guilty and fears that family members have a bad perception of her because of her father's wife. In particular, her deceased brother's family had a good relationship with her step-mother because they lived so far away ([place] ).  Her parents divorced in 65 when Harlea was 83. They had separated twice before. It was a bitter divorce and mother did not want the divorce. Winna has 1 deceased brother who was 3 years older and a sister 4 years younger (in [place], [place]). Brother died of complications from bone marrow transplant. Her mother in law was diagnosed with cancer around same time and passed away. Her brother was best friends with her husband and they worked together. 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 Chaley and Dorise married 33 years and together 8 before that. They have 2 boys. (Dylan age 69 and [place] age 37). Both live in [place]. Signa lives with girlfriend. Both boys have diabetes. Her marital relationship is up and down. Relationship with mother: She was not a nurturing person. Vasiliki became very independent when her parents divorced. She wonders if mother is on spectrum. She says mom was quirky, especially socially. Mom was not  affectionate and they did not share interests. Mom had lots of friends, but not with a lot of depth.  Her grief hits her really hard. Lots of loss in her life. Uncle committed suicide, brother's death and mother in law's death. Ahlaya gets very sad with separation and thinks it was because of parents separations and divorce. When her mother went away to teach, it was devastating for Niesha and felt that maybe she was to blame.  Her weekly routine: Says she has backed off because they now butt heads. So goes to see her at [facility] once a week and drives her to appointments. She now goes out to visit to help her do laundry and other tasks.  She states that depression runs in her family. She tries to focus on self-care (exercise, socialize). She is invlved in community work. No big hobbies, but likes to read. Rarely drinks (maybe weekly) and occasional weed. Mother was diagnosed with depression, father and his brother both had depression. Zoanne's sister is also prone to depression. She takes Lamotrigine  for several years. Level of depression is about a 3 of 10. Has some transient anxiety. Sleep and appetite is fine. Does mindfulness and meditation practices.           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 She is also experiencing unresolved grief related to numerous losses and is seeking to work through those feelings. She also reports very low self-esteem. Will explore origins of this esteem issue and develop strategies to resolve/manage. Goal date 12-25. Patient agreed to have a video Public affairs consultant) session, and understands the limits of the platform. She is at home and provider is at home office.   Session Note: Laelle talked about her volunteer time at [employer]. She  reduced ADD meds due to blood pressure issues. She explained a distressing situation where step-mother donated money without mentioning father's name. She talked about her journey and the desire to have a more full and satisfying life.                CONI ALM KERNS, PhD 2:11p-3:00p 49 minutes

## 2024-05-24 ENCOUNTER — Other Ambulatory Visit: Payer: Self-pay | Admitting: Behavioral Health

## 2024-05-24 DIAGNOSIS — F411 Generalized anxiety disorder: Secondary | ICD-10-CM

## 2024-05-24 DIAGNOSIS — F331 Major depressive disorder, recurrent, moderate: Secondary | ICD-10-CM

## 2024-05-25 NOTE — Telephone Encounter (Signed)
 Verify dose. Note says 100 mg

## 2024-06-12 ENCOUNTER — Ambulatory Visit: Admitting: Psychology

## 2024-06-12 DIAGNOSIS — F411 Generalized anxiety disorder: Secondary | ICD-10-CM

## 2024-06-12 NOTE — Progress Notes (Signed)
 [facility] Counselor Initial Adult Exam  Name: Megan Cain Date: 06/12/2024 MRN: [id number] DOB: [birth year] PCP: Megan Darice CROME, MD    Guardian/Payee:  N/A    Paperwork requested: No   Reason for Visit /Presenting Problem: mild depression and anxiety, grief, poor self-esteem  Mental Status Exam: Appearance:   Casual     Behavior:  Appropriate  Motor:  Normal  Speech/Language:   Clear and Coherent  Affect:  Appropriate  Mood:  normal  Thought process:  normal  Thought content:    WNL  Sensory/Perceptual disturbances:    WNL  Orientation:  oriented to person, place, and situation  Attention:  Good  Concentration:  Good  Memory:  WNL  Fund of knowledge:   Good  Insight:    Good  Judgment:   Good  Impulse Control:  Good     Reported Symptoms:  guilt, grief (sadness), depleted sense of self  Risk Assessment: Danger to Self:  No Self-injurious Behavior: No Danger to Others: No Duty to Warn:no Physical Aggression / Violence:No  Access to Firearms a concern: No  Gang Involvement:No  Patient / guardian was educated about steps  to take if suicide or homicide risk level  increases between visits: n/a While future psychiatric events cannot be accurately predicted, the patient does not currently require acute inpatient psychiatric care and does not currently meet [facility]  involuntary commitment criteria.  Substance Abuse History: Current substance abuse: No     Past Psychiatric History:   No previous psychological problems have been observed Outpatient Providers:N/A History of Psych Hospitalization: No  Psychological Testing: N/A   Abuse History:  Victim of: No., N/A   Report needed: No. Victim of Neglect:No. Perpetrator of N/A  Witness / Exposure to Domestic Violence: No   Protective Services Involvement: No  Witness to [employer] Violence:  No   Family History:  Family History  Problem Relation Age of Onset   Dementia Mother    Hypertension Mother    Breast cancer Mother    Dementia Father    Heart disease Father    Hyperlipidemia Father    Stroke Father    Depression Sister    Anxiety disorder Sister    Leukemia Brother    Breast cancer Maternal Aunt    Suicidality Paternal Uncle    Diabetes Son        type 1 in both sons   Diabetes Son    Colon cancer Neg Hx    Stomach cancer Neg Hx    Esophageal cancer Neg Hx    Rectal cancer Neg Hx     Living situation: the patient lives with their spouse  Sexual Orientation: Straight  Relationship Status: married  Name of spouse / other:Megan Cain If a parent, number of children / ages:Two boys, ages 37 and 53  Support Systems: spouse  Surveyor, Quantity Stress:  No   Income/Employment/Disability: Employment  Financial Planner: No   Educational History: Education: unknown  Oncologist: unknown  Any cultural differences that may affect / interfere with treatment:  not applicable   Recreation/Hobbies: reading  Stressors: Marital or family conflict    Strengths: Supportive Relationships  Barriers:  undetermined   Legal History: Pending legal issue / charges:  The patient has no significant history of legal issues. 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 shoulder   SQUAMOUS CELL CARCINOMA EXCISION  09/2015   Right ear   thumb surgery Left 1975   TRIGGER FINGER RELEASE  05/2023    Medications: Current Outpatient Medications  Medication Sig Dispense Refill   albuterol (VENTOLIN HFA) 108 (90 Base) MCG/ACT inhaler 1 puff as needed Inhalation every 4 hrs     amphetamine -dextroamphetamine  (ADDERALL XR) 25 MG 24 hr  capsule Take 1 capsule by mouth every morning. 30 capsule 0   amphetamine -dextroamphetamine  (ADDERALL) 20 MG tablet Take 1 tablet (20 mg total) by mouth daily. 30 tablet 0   amphetamine -dextroamphetamine  (ADDERALL) 20 MG tablet Take 1 tablet (20 mg total) by mouth daily. 30 tablet 0   amphetamine -dextroamphetamine  (ADDERALL) 30 MG tablet Take 1 tablet by mouth daily. (Patient not taking: Reported on 04/24/2024) 30 tablet 0   B Complex Vitamins (B COMPLEX PO) Take 1 tablet by mouth daily.     Calcium Carbonate (CALTRATE 600 PO) Take 600 mg by mouth 2 (two) times daily.     carvedilol (COREG) 6.25 MG tablet Take 6.25 mg by mouth 2 (two) times daily.     cholecalciferol (VITAMIN D ) 1000 UNITS tablet Take 1,000 Units by mouth  daily.     Coenzyme Q10 (CO Q 10 PO) Take 1 tablet by mouth daily.     Continuous Blood Gluc Sensor (DEXCOM G4 SENSOR) MISC by Does not apply route. G7     Cyanocobalamin (VITAMIN B-12 PO) Take 1 tablet by mouth daily.     escitalopram  (LEXAPRO ) 10 MG tablet Take 1 tablet (10 mg total) by mouth daily. 30 tablet 2   escitalopram  (LEXAPRO ) 5 MG tablet Take 1 tablet (5 mg total) by mouth daily. 30 tablet 1   fish oil-omega-3 fatty acids 1000 MG capsule Take 2 g by mouth daily.     fluticasone (FLONASE) 50 MCG/ACT nasal spray Place into both nostrils daily.     JARDIANCE 25 MG TABS tablet Take 25 mg by mouth daily.     lamoTRIgine  (LAMICTAL ) 100 MG tablet Take 1 tablet (100 mg total) by mouth 2 (two) times daily. 180 tablet 0   LANTUS SOLOSTAR 100 UNIT/ML Solostar Pen Inject 6 Units into the skin 2 (two) times daily.     loratadine (CLARITIN) 10 MG tablet Take 10 mg by mouth daily.     losartan (COZAAR) 100 MG tablet Take 100 mg by mouth daily.     MAGNESIUM PO Take 1 tablet by mouth daily.     Multiple Vitamins-Minerals (MULTIVITAMIN WITH MINERALS) tablet Take 1 tablet by mouth daily.     NOVOLOG 100 UNIT/ML injection 100u/day [place] via pump for 90 days     Probiotic Product (PROBIOTIC DAILY PO) Take 1 tablet by mouth daily.     rosuvastatin (CRESTOR) 5 MG tablet Take 5 mg by mouth daily.     spironolactone (ALDACTONE) 25 MG tablet Take 25 mg by mouth daily.     thyroid  (ARMOUR) 15 MG tablet Take 15 mg by mouth daily. Takes with 60mg      thyroid  (ARMOUR) 60 MG tablet Take 60 mg by mouth daily before breakfast. Takes with 15mg      Turmeric 450 MG CAPS Take by mouth 2 (two) times daily.     No current facility-administered medications for this visit.    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 She has issues of grief separation and abandonment. Recent diagnosis of  type 1 diabetic and insulin  dependent. Her sons are diabetic as well. Most recently consumed with the news that her father's wife is terminally ill. Father died a few years ago, and Davanee had a lot of anger and resentment toward father's wife. She is angry, bitter and hurt toward this woman, who also treated [place] poorly.  Says her father's closest friends also had issues with his wife. Talah knew this woman for almost 30 years and they are very different. They did not really connect well from the start. This woman was very controlling. Karely says I don't like people to dislike me and her step mother did not like her. Tennille feels guilty and fears that family members have a bad perception of her because of her father's wife. In particular, her deceased brother's family had a good relationship with her step-mother because they lived so far away ([place] ).  Her parents divorced in 12 when Marqueta was 89. They had separated twice before. It was a bitter divorce and mother did not want the divorce. Edithe has 1 deceased brother who was 3 years older and a sister 4 years younger (in [place], [place]). Brother died of complications from bone marrow transplant. Her mother in law was diagnosed with cancer around same time and passed away. Her brother was best friends with her husband and they worked together. Her husband, father and brother worked in their own business together. Her husband  Rodman) now owns the business with [place]. 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 Cienna and Dorise married 33 years and together 8 before that. They have 2 boys. (Dylan age 64 and Griffin age 91). Both live in [place]. Signa lives with girlfriend. Both boys have diabetes. Her marital relationship is up and down. Relationship with mother: She was not a nurturing person. Onesha became very independent when her parents divorced. She wonders if mother is on spectrum. She says mom was quirky, especially socially. Mom was not  affectionate and they did not share interests. Mom had lots of friends, but not with a lot of depth.  Her grief hits her really hard. Lots of loss in her life. Uncle committed suicide, brother's death and mother in law's death. Shivon gets very sad with separation and thinks it was because of parents separations and divorce. When her mother went away to teach, it was devastating for Bannie and felt that maybe she was to blame.  Her weekly routine: Says she has backed off because they now butt heads. So goes to see her at [facility] once a week and drives her to appointments. She now goes out to visit to help her do laundry and other tasks.  She states that depression runs in her family. She tries to focus on self-care (exercise, socialize). She is invlved in community work. No big hobbies, but likes to read. Rarely drinks (maybe weekly) and occasional weed. Mother was diagnosed with depression, father and his brother both had depression. Pollyann's sister is also prone to depression. She takes Lamotrigine  for several years. Level of depression is about a 3 of 10. Has some transient anxiety. Sleep and appetite is fine. Does mindfulness and meditation practices.           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 She is also experiencing unresolved grief related to numerous losses and is seeking to work through those feelings. She also reports very low self-esteem. Will explore origins of this esteem issue and develop strategies to resolve/manage. Goal date 12-26. Patient agreed to have a video Public Affairs Consultant) session, and understands the limits of the platform. She is at home and provider is at home office.   Session Note: Agness  says she has a couple of issues to address. Her  mother has been getting confused and did not come to Thanksgiving. She completely forgot that she was to go to [facility] for the holiday. She and her mother have had a few arguments in the past weeks. The other big issue is that her son is finally dating someone and he seems very happy. Keana realized that her son's new girlfriend is politically very different in that she is conservative. She also said that the new girlfriend shared with her other son's fiance a number of concerning experiences. This includes a DUI. She also shared that she is still texting her old boyfriend of three years and she has a restraining order against her. We talked about how to address this issue with their son.                   CONI ALM KERNS, PhD 2:11p-3:00p 49 minutes

## 2024-06-21 ENCOUNTER — Ambulatory Visit: Admitting: Behavioral Health

## 2024-06-21 ENCOUNTER — Encounter: Payer: Self-pay | Admitting: Behavioral Health

## 2024-06-21 DIAGNOSIS — F331 Major depressive disorder, recurrent, moderate: Secondary | ICD-10-CM | POA: Diagnosis not present

## 2024-06-21 DIAGNOSIS — F900 Attention-deficit hyperactivity disorder, predominantly inattentive type: Secondary | ICD-10-CM

## 2024-06-21 DIAGNOSIS — F411 Generalized anxiety disorder: Secondary | ICD-10-CM

## 2024-06-21 DIAGNOSIS — F330 Major depressive disorder, recurrent, mild: Secondary | ICD-10-CM

## 2024-06-21 MED ORDER — AMPHETAMINE-DEXTROAMPHETAMINE 20 MG PO TABS: 20.0000 mg | ORAL_TABLET | Freq: Every day | ORAL | 0 refills | Status: AC

## 2024-06-21 MED ORDER — LAMOTRIGINE 100 MG PO TABS: 100.0000 mg | ORAL_TABLET | Freq: Two times a day (BID) | ORAL | 1 refills | Status: AC

## 2024-06-21 MED ORDER — ESCITALOPRAM OXALATE 10 MG PO TABS: 10.0000 mg | ORAL_TABLET | Freq: Every day | ORAL | 1 refills | Status: AC

## 2024-06-21 NOTE — Progress Notes (Signed)
 [facility]  Patient ID: Megan Cain,  MRN: [id number]  PCP: Megan Darice CROME, MD  Date of Evaluation: 06/21/2024 Time spent:30 minutes  Chief Complaint:  Chief Complaint   Depression; Anxiety; Follow-up; Medication Refill; Patient Education     HISTORY/CURRENT STATUS: HPI Megan Cain, 63 year old female presents to this office for follow up and medication management.  Collateral information should be considered reliable.  Patient reports that she is still feeling well overall.  She is journaling any changes with moods.  Not sure if she needs an adjustment or increase of her Lexapro  today but will call office if experiencing any or negative changes.  Will rates depression at 3/10 and anxiety at 3/10. Sleep has been ok but inconsistent.  Denies any history of mania, no psychosis, no auditory or visual hallucinations or delirium.  Denies SI or HI.   Past psychiatric medication trials: Zoloft Lamictal  Individual Medical History/ Review of Systems: Changes? :No   Allergies: Lisinopril  Current Medications:  Current Outpatient Medications:    albuterol (VENTOLIN HFA) 108 (90 Base) MCG/ACT inhaler, 1 puff as needed Inhalation every 4 hrs, Disp: , Rfl:    amphetamine -dextroamphetamine  (ADDERALL XR) 25 MG 24 hr capsule, Take 1 capsule by mouth every morning., Disp: 30 capsule, Rfl: 0   amphetamine -dextroamphetamine  (ADDERALL) 20 MG tablet, Take 1 tablet (20 mg total) by mouth daily., Disp: 30 tablet, Rfl: 0   [START ON 07/05/2024] amphetamine -dextroamphetamine  (ADDERALL) 20 MG tablet, Take 1 tablet (20 mg total) by mouth daily., Disp: 30 tablet, Rfl: 0   amphetamine -dextroamphetamine  (ADDERALL) 30 MG tablet, Take 1 tablet by mouth daily. (Patient not taking: Reported on 04/24/2024), Disp: 30 tablet, Rfl: 0   B Complex Vitamins (B COMPLEX PO), Take 1 tablet by mouth daily., Disp: , Rfl:    Calcium Carbonate (CALTRATE 600 PO), Take 600 mg by mouth 2 (two) times daily.,  Disp: , Rfl:    carvedilol (COREG) 6.25 MG tablet, Take 6.25 mg by mouth 2 (two) times daily., Disp: , Rfl:    cholecalciferol (VITAMIN D ) 1000 UNITS tablet, Take 1,000 Units by mouth daily., Disp: , Rfl:    Coenzyme Q10 (CO Q 10 PO), Take 1 tablet by mouth daily., Disp: , Rfl:    Continuous Blood Gluc Sensor (DEXCOM G4 SENSOR) MISC, by Does not apply route. G7, Disp: , Rfl:    Cyanocobalamin (VITAMIN B-12 PO), Take 1 tablet by mouth daily., Disp: , Rfl:    escitalopram  (LEXAPRO ) 10 MG tablet, Take 1 tablet (10 mg total) by mouth daily., Disp: 90 tablet, Rfl: 1   escitalopram  (LEXAPRO ) 5 MG tablet, Take 1 tablet (5 mg total) by mouth daily., Disp: 30 tablet, Rfl: 1   fish oil-omega-3 fatty acids 1000 MG capsule, Take 2 g by mouth daily., Disp: , Rfl:    fluticasone (FLONASE) 50 MCG/ACT nasal spray, Place into both nostrils daily., Disp: , Rfl:    JARDIANCE 25 MG TABS tablet, Take 25 mg by mouth daily., Disp: , Rfl:    lamoTRIgine  (LAMICTAL ) 100 MG tablet, Take 1 tablet (100 mg total) by mouth 2 (two) times daily., Disp: 180 tablet, Rfl: 1   LANTUS SOLOSTAR 100 UNIT/ML Solostar Pen, Inject 6 Units into the skin 2 (two) times daily., Disp: , Rfl:    loratadine (CLARITIN) 10 MG tablet, Take 10 mg by mouth daily., Disp: , Rfl:    losartan (COZAAR) 100 MG tablet, Take 100 mg by mouth daily., Disp: , Rfl:    MAGNESIUM PO, Take 1 tablet  by mouth daily., Disp: , Rfl:    Multiple Vitamins-Minerals (MULTIVITAMIN WITH MINERALS) tablet, Take 1 tablet by mouth daily., Disp: , Rfl:    NOVOLOG 100 UNIT/ML injection, 100u/day [place] via pump for 90 days, Disp: , Rfl:    Probiotic Product (PROBIOTIC DAILY PO), Take 1 tablet by mouth daily., Disp: , Rfl:    rosuvastatin (CRESTOR) 5 MG tablet, Take 5 mg by mouth daily., Disp: , Rfl:    spironolactone (ALDACTONE) 25 MG tablet, Take 25 mg by mouth daily., Disp: , Rfl:    thyroid  (ARMOUR) 15 MG tablet, Take 15 mg by mouth daily. Takes with 60mg , Disp: , Rfl:    thyroid   (ARMOUR) 60 MG tablet, Take 60 mg by mouth daily before breakfast. Takes with 15mg , Disp: , Rfl:    Turmeric 450 MG CAPS, Take by mouth 2 (two) times daily., Disp: , Rfl:  Medication Side Effects: none  Family Medical/ Social History: Changes? No  MENTAL HEALTH EXAM:  Last menstrual period 02/21/2015.There is no height or weight on file to calculate BMI.  General Appearance: Casual, Neat, and Well Groomed  Eye Contact:  Good  Speech:  Clear and Coherent  Volume:  Normal  Mood:  NA  Affect:  Appropriate  Thought Process:  Coherent  Orientation:  Full (Time, Place, and Person)  Thought Content: Logical   Suicidal Thoughts:  No  Homicidal Thoughts:  No  Memory:  WNL  Judgement:  Good  Insight:  Good  Psychomotor Activity:  Normal  Concentration:  Concentration: Good  Recall:  Good  Fund of Knowledge: Good  Language: Good  Assets:  Desire for Improvement  ADL's:  Intact  Cognition: WNL  Prognosis:  Good    DIAGNOSES:    ICD-10-CM   1. Generalized anxiety disorder  F41.1 lamoTRIgine  (LAMICTAL ) 100 MG tablet    escitalopram  (LEXAPRO ) 10 MG tablet    2. Major depressive disorder, recurrent episode, moderate (HCC)  F33.1 lamoTRIgine  (LAMICTAL ) 100 MG tablet    3. Mild episode of recurrent major depressive disorder  F33.0 escitalopram  (LEXAPRO ) 10 MG tablet    4. Attention deficit hyperactivity disorder (ADHD), predominantly inattentive type  F90.0 amphetamine -dextroamphetamine  (ADDERALL) 20 MG tablet      Receiving Psychotherapy: No    RECOMMENDATIONS:   Greater than 50% of 30  min face to face time with patient was spent on counseling and coordination of care.  For now she is happy with her medications and requesting no medication adjustments or changes.  Sometimes she would like to consider increase of her Adderall but is concerned due to feeling heart rate increasing.   To continue working with PCP to get BP under control. She is currently on 3 medications for BP.       We agreed today to:   To continue Adderall to 25  mg  IR daily after breakfast Pt will check BP  regularly. To continue Lexapro  10 mg daily after breakfast.  Will continue Lamictal  100 mg daily Will report side effects or worsening symptoms promptly It was recommended that patient monitor weights and blood pressure consistently Will follow-up to this office in 12  weeks to reassess Provided emergency contact information Discussed potential benefits, risks, and side effects of stimulants with patient to include increased heart rate, palpitations, insomnia, increased anxiety, increased irritability, or decreased appetite.  Instructed patient to contact office if experiencing any significant tolerability issues.     Megan DELENA Pizza, NP

## 2024-07-02 ENCOUNTER — Other Ambulatory Visit: Payer: Self-pay | Admitting: Behavioral Health

## 2024-07-02 DIAGNOSIS — F330 Major depressive disorder, recurrent, mild: Secondary | ICD-10-CM

## 2024-07-02 DIAGNOSIS — F411 Generalized anxiety disorder: Secondary | ICD-10-CM

## 2024-07-26 ENCOUNTER — Ambulatory Visit: Payer: Self-pay | Admitting: Psychology

## 2024-07-26 DIAGNOSIS — F4323 Adjustment disorder with mixed anxiety and depressed mood: Secondary | ICD-10-CM

## 2024-07-26 DIAGNOSIS — F411 Generalized anxiety disorder: Secondary | ICD-10-CM

## 2024-07-26 NOTE — Progress Notes (Signed)
 Engineer, Civil (consulting) Health Counselor Initial Adult Exam  Name: Megan Cain [facility] Date: 07/26/2024 MRN: [id number] DOB: [birth year] PCP: [name], MD    Guardian/Payee:  N/A    Paperwork requested: No   Reason for Visit /Presenting Problem: mild depression and anxiety, grief, poor self-esteem  Mental Status Exam: Appearance:   Casual     Behavior:  Appropriate  Motor:  Normal  Speech/Language:   Clear and Coherent  Affect:  Appropriate  Mood:  normal  Thought process:  normal  Thought content:    WNL  Sensory/Perceptual disturbances:    WNL  Orientation:  oriented to person, place, and situation  Attention:  Good  Concentration:  Good  Memory:  WNL  Fund of knowledge:   Good  Insight:    Good  Judgment:   Good  Impulse Control:  Good     Reported Symptoms:  guilt, grief (sadness), depleted sense of self  Risk Assessment: Danger to Self:  No Self-injurious Behavior: No Danger to Others: No Duty to Warn:no Physical Aggression / Violence:No  Access  to Firearms a concern: No  Gang Involvement:No  Patient / guardian was educated about steps to take if  suicide or homicide risk level increases between visits: n/a While future psychiatric events cannot be accurately predicted, the patient does not currently require acute inpatient psychiatric care and does not currently meet [place]  involuntary commitment criteria.  Substance Abuse History: Current substance abuse: No     Past Psychiatric History:   No previous psychological problems have been observed Outpatient Providers:N/A History of Psych Hospitalization: No  Psychological Testing: N/A   Abuse History:  Victim of: No., N/A   Report needed: No. Victim of Neglect:No. Perpetrator of N/A  Witness / Exposure to Domestic Violence: No   Protective Services Involvement: No  Witness to [employer] Violence:  No   Family History:  Family History  Problem Relation Age of Onset   Dementia Mother    Hypertension Mother    Breast cancer Mother    Dementia Father    Heart disease Father    Hyperlipidemia Father    Stroke Father    Depression Sister    Anxiety disorder Sister    Leukemia Brother    Breast cancer Maternal Aunt    Suicidality Paternal Uncle    Diabetes Son        type 1 in both sons   Diabetes Son    Colon cancer Neg Hx    Stomach cancer Neg Hx    Esophageal cancer Neg Hx    Rectal cancer Neg Hx     Living situation: the patient lives with their spouse  Sexual Orientation: Straight  Relationship Status: married  Name of spouse / other:[name] If a parent, number of children / ages:Two boys, ages 30 and 36  Support Systems: spouse  Surveyor, Quantity Stress:  No   Income/Employment/Disability: Employment  Financial Planner: No   Educational History: Education: unknown  Oncologist: unknown  Any cultural differences that may affect / interfere with treatment:  not applicable   Recreation/Hobbies: reading  Stressors: Marital or family conflict    Strengths: Supportive Relationships  Barriers:  undetermined   Legal  History: Pending legal issue / charges: The patient has no significant history of legal issues. 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 shoulder   SQUAMOUS CELL CARCINOMA EXCISION  09/2015   Right ear   thumb surgery Left 1975   TRIGGER FINGER RELEASE  05/2023    Medications: Current Outpatient Medications  Medication Sig Dispense Refill   albuterol (VENTOLIN HFA) 108 (90 Base) MCG/ACT inhaler 1 puff as  needed Inhalation every 4 hrs     amphetamine -dextroamphetamine  (ADDERALL XR) 25 MG 24 hr capsule Take 1 capsule by mouth every morning. 30 capsule 0   amphetamine -dextroamphetamine  (ADDERALL) 20 MG tablet Take 1 tablet (20 mg total) by mouth daily. 30 tablet 0   amphetamine -dextroamphetamine  (ADDERALL) 20 MG tablet Take 1 tablet (20 mg total) by mouth daily. 30 tablet 0   amphetamine -dextroamphetamine  (ADDERALL) 30 MG tablet Take 1 tablet by mouth daily. (Patient not taking: Reported on 04/24/2024) 30 tablet 0   B Complex Vitamins (B COMPLEX PO) Take 1 tablet by mouth daily.     Calcium Carbonate (CALTRATE 600 PO) Take 600 mg by mouth 2 (two) times daily.     carvedilol (COREG) 6.25 MG tablet Take 6.25 mg by mouth 2 (two) times daily.     cholecalciferol (VITAMIN D ) 1000  UNITS tablet Take 1,000 Units by mouth daily.     Coenzyme Q10 (CO Q 10 PO) Take 1 tablet by mouth daily.     Continuous Blood Gluc Sensor (DEXCOM G4 SENSOR) MISC by Does not apply route. G7     Cyanocobalamin (VITAMIN B-12 PO) Take 1 tablet by mouth daily.     escitalopram  (LEXAPRO ) 10 MG tablet Take 1 tablet (10 mg total) by mouth daily. 90 tablet 1   escitalopram  (LEXAPRO ) 5 MG tablet Take 1 tablet (5 mg total) by mouth daily. 30 tablet 1   fish oil-omega-3 fatty acids 1000 MG capsule Take 2 g by mouth daily.     fluticasone (FLONASE) 50 MCG/ACT nasal spray Place into both nostrils daily.     JARDIANCE 25 MG TABS tablet Take 25 mg by mouth daily.     lamoTRIgine  (LAMICTAL ) 100 MG tablet Take 1 tablet (100 mg total) by mouth 2 (two) times daily. 180 tablet 1   LANTUS SOLOSTAR 100 UNIT/ML Solostar Pen Inject 6 Units into the skin 2 (two) times daily.     loratadine (CLARITIN) 10 MG tablet Take 10 mg by mouth daily.     losartan (COZAAR) 100 MG tablet Take 100 mg by mouth daily.     MAGNESIUM PO Take 1 tablet by mouth daily.     Multiple Vitamins-Minerals (MULTIVITAMIN WITH MINERALS) tablet Take 1 tablet by mouth daily.     NOVOLOG 100 UNIT/ML injection 100u/day [place] via pump for 90 days     Probiotic Product (PROBIOTIC DAILY PO) Take 1 tablet by mouth daily.     rosuvastatin (CRESTOR) 5 MG tablet Take 5 mg by mouth daily.     spironolactone (ALDACTONE) 25 MG tablet Take 25 mg by mouth daily.     thyroid  (ARMOUR) 15 MG tablet Take 15 mg by mouth daily. Takes with 60mg      thyroid  (ARMOUR) 60 MG tablet Take 60 mg by mouth daily before breakfast. Takes with 15mg      Turmeric 450 MG CAPS Take by mouth 2 (two) times daily.     No current facility-administered medications for this visit.    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 She has issues of grief separation  and abandonment. Recent diagnosis of type 1 diabetic and insulin  dependent. Her sons are diabetic as well. Most recently consumed with the news that her father's wife is terminally ill. Father died a few years ago, and Nicie had a lot of anger and resentment toward  father's wife. She is angry, bitter and hurt toward this woman, who also treated [place] poorly. Says her father's closest friends also had issues with his wife. Odelia knew this woman for almost 30 years and they are very different. They did not really connect well from the start. This woman was very controlling. Nelline says I don't like people to dislike me and her step mother did not like her. Shaley feels guilty and fears that family members have a bad perception of her because of her father's wife. In particular, her deceased brother's family had a good relationship with her step-mother because they lived so far away ([place] ).  Her parents divorced in 71 when Lynne was 49. They had separated twice before. It was a bitter divorce and mother did not want the divorce. Burnette has 1 deceased brother who was 3 years older and a sister 4 years younger (in [place], [place]). Brother died of complications from bone marrow transplant. Her mother in law was diagnosed with cancer around same time and passed away. Her brother was best friends with her husband and they worked together. Her husband, father and brother worked in their own business together. Her husband  Rodman) now owns the business with [place]. 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 [place] and Dorise married 33 years and together 8 before that. They have 2 boys. (Dylan age 18 and Griffin age 67). Both live in [place]. Signa lives with girlfriend. Both boys have diabetes. Her marital relationship is up and down. Relationship with mother: She was not a nurturing person. Oneisha became very independent when her parents divorced. She wonders if mother is on spectrum. She says mom was quirky,  especially socially. Mom was not affectionate and they did not share interests. Mom had lots of friends, but not with a lot of depth.  Her grief hits her really hard. Lots of loss in her life. Uncle committed suicide, brother's death and mother in law's death. Selen gets very sad with separation and thinks it was because of parents separations and divorce. When her mother went away to teach, it was devastating for Geanette and felt that maybe she was to blame.  Her weekly routine: Says she has backed off because they now butt heads. So goes to see her at [facility] once a week and drives her to appointments. She now goes out to visit to help her do laundry and other tasks.  She states that depression runs in her family. She tries to focus on self-care (exercise, socialize). She is invlved in community work. No big hobbies, but likes to read. Rarely drinks (maybe weekly) and occasional weed. Mother was diagnosed with depression, father and his brother both had depression. Da's sister is also prone to depression. She takes Lamotrigine  for several years. Level of depression is about a 3 of 10. Has some transient anxiety. Sleep and appetite is fine. Does mindfulness and meditation practices.           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 She is also experiencing unresolved grief related to numerous losses and is seeking to work through those feelings. She also reports very low self-esteem. Will explore origins of this esteem issue and develop strategies to resolve/manage. Goal date 12-26. Patient was seen inprovider's office for session.  Session Note: Gilma says her 76  year old cat is sick. She fears they could lose her soon and knows it will be difficult. 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 She says that her ADHD meds are helpful, but doesn't solve her issues.                      CONI ALM KERNS, PhD 10:40a-11:30a 50 minutes                                                                  "

## 2024-09-19 ENCOUNTER — Ambulatory Visit: Payer: Self-pay | Admitting: Behavioral Health
